# Patient Record
Sex: Male | Born: 1965 | Race: White | Hispanic: No | Marital: Married | Smoking: Never smoker
Health system: Southern US, Community
[De-identification: ages and names within clinical notes are randomized; demographics above are authoritative.]

## PROBLEM LIST (undated history)

## (undated) DIAGNOSIS — R002 Palpitations: Secondary | ICD-10-CM

## (undated) DIAGNOSIS — R51 Headache: Secondary | ICD-10-CM

## (undated) DIAGNOSIS — J45909 Unspecified asthma, uncomplicated: Secondary | ICD-10-CM

## (undated) DIAGNOSIS — R519 Headache, unspecified: Secondary | ICD-10-CM

## (undated) DIAGNOSIS — G4730 Sleep apnea, unspecified: Secondary | ICD-10-CM

## (undated) DIAGNOSIS — I2510 Atherosclerotic heart disease of native coronary artery without angina pectoris: Secondary | ICD-10-CM

## (undated) DIAGNOSIS — I10 Essential (primary) hypertension: Secondary | ICD-10-CM

## (undated) DIAGNOSIS — F410 Panic disorder [episodic paroxysmal anxiety] without agoraphobia: Secondary | ICD-10-CM

## (undated) DIAGNOSIS — K219 Gastro-esophageal reflux disease without esophagitis: Secondary | ICD-10-CM

## (undated) DIAGNOSIS — K589 Irritable bowel syndrome without diarrhea: Secondary | ICD-10-CM

## (undated) DIAGNOSIS — Z818 Family history of other mental and behavioral disorders: Secondary | ICD-10-CM

## (undated) DIAGNOSIS — F329 Major depressive disorder, single episode, unspecified: Secondary | ICD-10-CM

## (undated) DIAGNOSIS — F411 Generalized anxiety disorder: Secondary | ICD-10-CM

## (undated) DIAGNOSIS — I4510 Unspecified right bundle-branch block: Secondary | ICD-10-CM

## (undated) DIAGNOSIS — H539 Unspecified visual disturbance: Secondary | ICD-10-CM

## (undated) DIAGNOSIS — K449 Diaphragmatic hernia without obstruction or gangrene: Secondary | ICD-10-CM

## (undated) DIAGNOSIS — N419 Inflammatory disease of prostate, unspecified: Secondary | ICD-10-CM

## (undated) HISTORY — DX: Generalized anxiety disorder: F41.1

## (undated) HISTORY — DX: Sleep apnea, unspecified: G47.30

## (undated) HISTORY — DX: Essential (primary) hypertension: I10

## (undated) HISTORY — DX: Atherosclerotic heart disease of native coronary artery without angina pectoris: I25.10

## (undated) HISTORY — DX: Gastro-esophageal reflux disease without esophagitis: K21.9

## (undated) HISTORY — DX: Unspecified visual disturbance: H53.9

## (undated) HISTORY — PX: OTHER SURGICAL HISTORY: SHX169

## (undated) HISTORY — DX: Irritable bowel syndrome, unspecified: K58.9

## (undated) HISTORY — PX: COLONOSCOPY: SHX174

## (undated) HISTORY — DX: Inflammatory disease of prostate, unspecified: N41.9

## (undated) HISTORY — DX: Unspecified asthma, uncomplicated: J45.909

## (undated) HISTORY — DX: Diaphragmatic hernia without obstruction or gangrene: K44.9

## (undated) HISTORY — DX: Family history of other mental and behavioral disorders: Z81.8

## (undated) HISTORY — DX: Headache: R51

## (undated) HISTORY — DX: Unspecified right bundle-branch block: I45.10

## (undated) HISTORY — DX: Headache, unspecified: R51.9

## (undated) HISTORY — DX: Major depressive disorder, single episode, unspecified: F32.9

## (undated) HISTORY — DX: Panic disorder (episodic paroxysmal anxiety): F41.0

## (undated) HISTORY — DX: Palpitations: R00.2

---

## 1998-02-28 ENCOUNTER — Emergency Department (HOSPITAL_COMMUNITY): Admission: EM | Admit: 1998-02-28 | Discharge: 1998-02-28 | Payer: Self-pay | Admitting: Emergency Medicine

## 1998-05-07 HISTORY — PX: FRACTURE SURGERY: SHX138

## 1999-02-13 ENCOUNTER — Emergency Department (HOSPITAL_COMMUNITY): Admission: EM | Admit: 1999-02-13 | Discharge: 1999-02-13 | Payer: Self-pay | Admitting: Emergency Medicine

## 1999-07-16 ENCOUNTER — Encounter: Payer: Self-pay | Admitting: Emergency Medicine

## 1999-07-17 ENCOUNTER — Inpatient Hospital Stay (HOSPITAL_COMMUNITY): Admission: EM | Admit: 1999-07-17 | Discharge: 1999-07-17 | Payer: Self-pay | Admitting: Emergency Medicine

## 1999-07-19 ENCOUNTER — Encounter: Admission: RE | Admit: 1999-07-19 | Discharge: 1999-07-19 | Payer: Self-pay | Admitting: *Deleted

## 2000-08-12 ENCOUNTER — Emergency Department (HOSPITAL_COMMUNITY): Admission: EM | Admit: 2000-08-12 | Discharge: 2000-08-12 | Payer: Self-pay | Admitting: Emergency Medicine

## 2000-08-12 ENCOUNTER — Encounter: Payer: Self-pay | Admitting: Emergency Medicine

## 2000-08-28 ENCOUNTER — Ambulatory Visit (HOSPITAL_COMMUNITY): Admission: RE | Admit: 2000-08-28 | Discharge: 2000-08-28 | Payer: Self-pay | Admitting: Neurosurgery

## 2000-08-28 ENCOUNTER — Encounter: Payer: Self-pay | Admitting: Neurosurgery

## 2000-11-27 ENCOUNTER — Emergency Department (HOSPITAL_COMMUNITY): Admission: EM | Admit: 2000-11-27 | Discharge: 2000-11-27 | Payer: Self-pay | Admitting: *Deleted

## 2001-09-20 ENCOUNTER — Encounter: Payer: Self-pay | Admitting: Emergency Medicine

## 2001-09-20 ENCOUNTER — Emergency Department: Admission: EM | Admit: 2001-09-20 | Discharge: 2001-09-20 | Payer: Self-pay | Admitting: Emergency Medicine

## 2001-11-19 ENCOUNTER — Emergency Department (HOSPITAL_COMMUNITY): Admission: EM | Admit: 2001-11-19 | Discharge: 2001-11-19 | Payer: Self-pay | Admitting: Emergency Medicine

## 2001-11-19 ENCOUNTER — Encounter: Payer: Self-pay | Admitting: Emergency Medicine

## 2001-11-20 ENCOUNTER — Encounter: Payer: Self-pay | Admitting: Emergency Medicine

## 2002-03-05 ENCOUNTER — Encounter: Payer: Self-pay | Admitting: Emergency Medicine

## 2002-03-05 ENCOUNTER — Inpatient Hospital Stay (HOSPITAL_COMMUNITY): Admission: EM | Admit: 2002-03-05 | Discharge: 2002-03-06 | Payer: Self-pay | Admitting: Emergency Medicine

## 2002-03-06 ENCOUNTER — Encounter: Payer: Self-pay | Admitting: Cardiology

## 2002-09-18 ENCOUNTER — Emergency Department (HOSPITAL_COMMUNITY): Admission: EM | Admit: 2002-09-18 | Discharge: 2002-09-18 | Payer: Self-pay | Admitting: Emergency Medicine

## 2002-10-16 ENCOUNTER — Emergency Department (HOSPITAL_COMMUNITY): Admission: EM | Admit: 2002-10-16 | Discharge: 2002-10-17 | Payer: Self-pay | Admitting: Emergency Medicine

## 2002-10-17 ENCOUNTER — Encounter: Payer: Self-pay | Admitting: Emergency Medicine

## 2003-02-03 ENCOUNTER — Emergency Department (HOSPITAL_COMMUNITY): Admission: EM | Admit: 2003-02-03 | Discharge: 2003-02-03 | Payer: Self-pay | Admitting: Emergency Medicine

## 2003-02-04 ENCOUNTER — Encounter: Payer: Self-pay | Admitting: *Deleted

## 2003-04-20 ENCOUNTER — Emergency Department (HOSPITAL_COMMUNITY): Admission: EM | Admit: 2003-04-20 | Discharge: 2003-04-20 | Payer: Self-pay | Admitting: Emergency Medicine

## 2003-06-07 ENCOUNTER — Emergency Department (HOSPITAL_COMMUNITY): Admission: EM | Admit: 2003-06-07 | Discharge: 2003-06-08 | Payer: Self-pay | Admitting: Emergency Medicine

## 2003-06-15 ENCOUNTER — Inpatient Hospital Stay (HOSPITAL_BASED_OUTPATIENT_CLINIC_OR_DEPARTMENT_OTHER): Admission: RE | Admit: 2003-06-15 | Discharge: 2003-06-15 | Payer: Self-pay | Admitting: Cardiology

## 2003-07-27 ENCOUNTER — Emergency Department (HOSPITAL_COMMUNITY): Admission: EM | Admit: 2003-07-27 | Discharge: 2003-07-27 | Payer: Self-pay | Admitting: Emergency Medicine

## 2003-08-30 ENCOUNTER — Emergency Department (HOSPITAL_COMMUNITY): Admission: EM | Admit: 2003-08-30 | Discharge: 2003-08-30 | Payer: Self-pay | Admitting: Emergency Medicine

## 2003-10-06 ENCOUNTER — Emergency Department (HOSPITAL_COMMUNITY): Admission: EM | Admit: 2003-10-06 | Discharge: 2003-10-06 | Payer: Self-pay | Admitting: Family Medicine

## 2003-11-11 ENCOUNTER — Emergency Department (HOSPITAL_COMMUNITY): Admission: EM | Admit: 2003-11-11 | Discharge: 2003-11-11 | Payer: Self-pay | Admitting: Emergency Medicine

## 2003-12-12 ENCOUNTER — Emergency Department (HOSPITAL_COMMUNITY): Admission: EM | Admit: 2003-12-12 | Discharge: 2003-12-13 | Payer: Self-pay | Admitting: Emergency Medicine

## 2004-03-24 ENCOUNTER — Emergency Department (HOSPITAL_COMMUNITY): Admission: AD | Admit: 2004-03-24 | Discharge: 2004-03-24 | Payer: Self-pay | Admitting: Family Medicine

## 2004-03-26 ENCOUNTER — Emergency Department (HOSPITAL_COMMUNITY): Admission: EM | Admit: 2004-03-26 | Discharge: 2004-03-27 | Payer: Self-pay | Admitting: Emergency Medicine

## 2004-03-31 ENCOUNTER — Emergency Department (HOSPITAL_COMMUNITY): Admission: EM | Admit: 2004-03-31 | Discharge: 2004-03-31 | Payer: Self-pay | Admitting: Family Medicine

## 2004-05-09 ENCOUNTER — Ambulatory Visit: Payer: Self-pay | Admitting: Family Medicine

## 2004-06-12 ENCOUNTER — Emergency Department (HOSPITAL_COMMUNITY): Admission: EM | Admit: 2004-06-12 | Discharge: 2004-06-12 | Payer: Self-pay | Admitting: Emergency Medicine

## 2004-07-04 ENCOUNTER — Encounter: Payer: Self-pay | Admitting: Family Medicine

## 2004-07-12 ENCOUNTER — Encounter: Payer: Self-pay | Admitting: Family Medicine

## 2004-07-24 ENCOUNTER — Ambulatory Visit: Payer: Self-pay | Admitting: Family Medicine

## 2004-10-05 ENCOUNTER — Ambulatory Visit: Payer: Self-pay | Admitting: Family Medicine

## 2004-10-27 ENCOUNTER — Emergency Department (HOSPITAL_COMMUNITY): Admission: EM | Admit: 2004-10-27 | Discharge: 2004-10-27 | Payer: Self-pay | Admitting: Emergency Medicine

## 2004-11-01 ENCOUNTER — Ambulatory Visit: Payer: Self-pay | Admitting: Family Medicine

## 2004-12-06 ENCOUNTER — Ambulatory Visit: Payer: Self-pay | Admitting: Sports Medicine

## 2004-12-22 ENCOUNTER — Ambulatory Visit: Payer: Self-pay | Admitting: Internal Medicine

## 2005-01-03 ENCOUNTER — Ambulatory Visit: Payer: Self-pay | Admitting: Internal Medicine

## 2005-01-03 HISTORY — PX: ESOPHAGOGASTRODUODENOSCOPY: SHX1529

## 2005-03-29 ENCOUNTER — Emergency Department (HOSPITAL_COMMUNITY): Admission: EM | Admit: 2005-03-29 | Discharge: 2005-03-29 | Payer: Self-pay | Admitting: *Deleted

## 2005-07-05 ENCOUNTER — Ambulatory Visit: Payer: Self-pay | Admitting: Family Medicine

## 2005-07-11 ENCOUNTER — Ambulatory Visit: Payer: Self-pay | Admitting: Internal Medicine

## 2005-07-26 ENCOUNTER — Ambulatory Visit: Payer: Self-pay | Admitting: Internal Medicine

## 2005-07-26 HISTORY — PX: COLONOSCOPY: SHX5424

## 2005-09-18 ENCOUNTER — Emergency Department (HOSPITAL_COMMUNITY): Admission: EM | Admit: 2005-09-18 | Discharge: 2005-09-18 | Payer: Self-pay | Admitting: Family Medicine

## 2005-10-09 ENCOUNTER — Emergency Department (HOSPITAL_COMMUNITY): Admission: EM | Admit: 2005-10-09 | Discharge: 2005-10-09 | Payer: Self-pay | Admitting: Emergency Medicine

## 2006-01-24 ENCOUNTER — Emergency Department (HOSPITAL_COMMUNITY): Admission: EM | Admit: 2006-01-24 | Discharge: 2006-01-24 | Payer: Self-pay | Admitting: Emergency Medicine

## 2006-02-22 ENCOUNTER — Emergency Department (HOSPITAL_COMMUNITY): Admission: EM | Admit: 2006-02-22 | Discharge: 2006-02-22 | Payer: Self-pay | Admitting: Emergency Medicine

## 2006-03-24 ENCOUNTER — Emergency Department (HOSPITAL_COMMUNITY): Admission: EM | Admit: 2006-03-24 | Discharge: 2006-03-25 | Payer: Self-pay | Admitting: Emergency Medicine

## 2006-05-31 ENCOUNTER — Emergency Department (HOSPITAL_COMMUNITY): Admission: EM | Admit: 2006-05-31 | Discharge: 2006-05-31 | Payer: Self-pay | Admitting: Emergency Medicine

## 2006-07-04 DIAGNOSIS — F411 Generalized anxiety disorder: Secondary | ICD-10-CM | POA: Insufficient documentation

## 2006-07-04 DIAGNOSIS — K219 Gastro-esophageal reflux disease without esophagitis: Secondary | ICD-10-CM | POA: Insufficient documentation

## 2006-07-04 DIAGNOSIS — F410 Panic disorder [episodic paroxysmal anxiety] without agoraphobia: Secondary | ICD-10-CM | POA: Insufficient documentation

## 2006-08-13 ENCOUNTER — Telehealth: Payer: Self-pay | Admitting: *Deleted

## 2006-08-14 ENCOUNTER — Encounter: Payer: Self-pay | Admitting: Family Medicine

## 2006-08-14 ENCOUNTER — Telehealth: Payer: Self-pay | Admitting: Family Medicine

## 2006-08-14 ENCOUNTER — Ambulatory Visit: Payer: Self-pay | Admitting: Family Medicine

## 2006-08-14 DIAGNOSIS — N419 Inflammatory disease of prostate, unspecified: Secondary | ICD-10-CM | POA: Insufficient documentation

## 2006-08-14 LAB — CONVERTED CEMR LAB
Nitrite: NEGATIVE
Urobilinogen, UA: 0.2

## 2006-08-19 ENCOUNTER — Telehealth: Payer: Self-pay | Admitting: *Deleted

## 2006-08-19 ENCOUNTER — Emergency Department (HOSPITAL_COMMUNITY): Admission: EM | Admit: 2006-08-19 | Discharge: 2006-08-19 | Payer: Self-pay | Admitting: Family Medicine

## 2006-10-11 ENCOUNTER — Emergency Department (HOSPITAL_COMMUNITY): Admission: EM | Admit: 2006-10-11 | Discharge: 2006-10-11 | Payer: Self-pay | Admitting: Emergency Medicine

## 2006-11-04 ENCOUNTER — Emergency Department (HOSPITAL_COMMUNITY): Admission: EM | Admit: 2006-11-04 | Discharge: 2006-11-04 | Payer: Self-pay | Admitting: Family Medicine

## 2006-12-11 ENCOUNTER — Ambulatory Visit: Payer: Self-pay | Admitting: Family Medicine

## 2006-12-23 ENCOUNTER — Ambulatory Visit (HOSPITAL_COMMUNITY): Admission: RE | Admit: 2006-12-23 | Discharge: 2006-12-23 | Payer: Self-pay | Admitting: Urology

## 2006-12-23 ENCOUNTER — Telehealth: Payer: Self-pay | Admitting: Family Medicine

## 2006-12-24 ENCOUNTER — Telehealth: Payer: Self-pay | Admitting: Family Medicine

## 2007-01-10 ENCOUNTER — Ambulatory Visit (HOSPITAL_BASED_OUTPATIENT_CLINIC_OR_DEPARTMENT_OTHER): Admission: RE | Admit: 2007-01-10 | Discharge: 2007-01-10 | Payer: Self-pay | Admitting: Urology

## 2007-01-22 ENCOUNTER — Telehealth (INDEPENDENT_AMBULATORY_CARE_PROVIDER_SITE_OTHER): Payer: Self-pay | Admitting: *Deleted

## 2007-01-23 ENCOUNTER — Telehealth: Payer: Self-pay | Admitting: *Deleted

## 2007-01-24 ENCOUNTER — Ambulatory Visit: Payer: Self-pay | Admitting: Family Medicine

## 2007-01-24 ENCOUNTER — Encounter (INDEPENDENT_AMBULATORY_CARE_PROVIDER_SITE_OTHER): Payer: Self-pay | Admitting: Family Medicine

## 2007-01-24 LAB — CONVERTED CEMR LAB
CO2: 27 meq/L (ref 19–32)
Cholesterol: 200 mg/dL (ref 0–200)
Eosinophils Relative: 5 % (ref 0–5)
Glucose, Bld: 106 mg/dL — ABNORMAL HIGH (ref 70–99)
HCT: 49.9 % (ref 39.0–52.0)
Hemoglobin: 16.3 g/dL (ref 13.0–17.0)
Lymphocytes Relative: 22 % (ref 12–46)
Lymphs Abs: 1.7 10*3/uL (ref 0.7–3.3)
Monocytes Absolute: 0.5 10*3/uL (ref 0.2–0.7)
Monocytes Relative: 7 % (ref 3–11)
RBC: 5.97 M/uL — ABNORMAL HIGH (ref 4.22–5.81)
Sodium: 141 meq/L (ref 135–145)
Total Bilirubin: 0.6 mg/dL (ref 0.3–1.2)
Total Protein: 7.3 g/dL (ref 6.0–8.3)
Triglycerides: 158 mg/dL — ABNORMAL HIGH (ref <150)
VLDL: 32 mg/dL (ref 0–40)
WBC: 7.7 10*3/uL (ref 4.0–10.5)

## 2007-01-28 ENCOUNTER — Encounter (INDEPENDENT_AMBULATORY_CARE_PROVIDER_SITE_OTHER): Payer: Self-pay | Admitting: Family Medicine

## 2007-02-17 ENCOUNTER — Telehealth (INDEPENDENT_AMBULATORY_CARE_PROVIDER_SITE_OTHER): Payer: Self-pay | Admitting: *Deleted

## 2007-02-17 ENCOUNTER — Ambulatory Visit: Payer: Self-pay | Admitting: Family Medicine

## 2007-02-17 ENCOUNTER — Encounter (INDEPENDENT_AMBULATORY_CARE_PROVIDER_SITE_OTHER): Payer: Self-pay | Admitting: Family Medicine

## 2007-02-17 LAB — CONVERTED CEMR LAB
HCT: 44 % (ref 39.0–52.0)
Hemoglobin: 14.8 g/dL (ref 13.0–17.0)
MCHC: 33.6 g/dL (ref 30.0–36.0)
RDW: 12.5 % (ref 11.5–14.0)

## 2007-03-10 ENCOUNTER — Encounter (INDEPENDENT_AMBULATORY_CARE_PROVIDER_SITE_OTHER): Payer: Self-pay | Admitting: *Deleted

## 2007-04-17 ENCOUNTER — Emergency Department (HOSPITAL_COMMUNITY): Admission: EM | Admit: 2007-04-17 | Discharge: 2007-04-18 | Payer: Self-pay | Admitting: Emergency Medicine

## 2007-04-17 ENCOUNTER — Ambulatory Visit: Payer: Self-pay | Admitting: Family Medicine

## 2007-04-18 ENCOUNTER — Encounter (INDEPENDENT_AMBULATORY_CARE_PROVIDER_SITE_OTHER): Payer: Self-pay | Admitting: *Deleted

## 2007-04-18 DIAGNOSIS — R0789 Other chest pain: Secondary | ICD-10-CM | POA: Insufficient documentation

## 2007-04-18 DIAGNOSIS — R079 Chest pain, unspecified: Secondary | ICD-10-CM

## 2007-04-21 ENCOUNTER — Encounter: Payer: Self-pay | Admitting: *Deleted

## 2007-04-21 ENCOUNTER — Telehealth: Payer: Self-pay | Admitting: *Deleted

## 2007-04-25 ENCOUNTER — Ambulatory Visit: Payer: Self-pay | Admitting: Family Medicine

## 2007-04-25 DIAGNOSIS — R002 Palpitations: Secondary | ICD-10-CM | POA: Insufficient documentation

## 2007-05-02 ENCOUNTER — Ambulatory Visit: Payer: Self-pay | Admitting: Internal Medicine

## 2007-05-06 ENCOUNTER — Telehealth (INDEPENDENT_AMBULATORY_CARE_PROVIDER_SITE_OTHER): Payer: Self-pay | Admitting: *Deleted

## 2007-05-28 ENCOUNTER — Ambulatory Visit: Payer: Self-pay | Admitting: Family Medicine

## 2007-05-28 DIAGNOSIS — F39 Unspecified mood [affective] disorder: Secondary | ICD-10-CM | POA: Insufficient documentation

## 2007-07-03 ENCOUNTER — Ambulatory Visit: Payer: Self-pay | Admitting: Family Medicine

## 2007-07-05 ENCOUNTER — Emergency Department (HOSPITAL_COMMUNITY): Admission: EM | Admit: 2007-07-05 | Discharge: 2007-07-06 | Payer: Self-pay | Admitting: Emergency Medicine

## 2007-07-08 ENCOUNTER — Emergency Department (HOSPITAL_COMMUNITY): Admission: EM | Admit: 2007-07-08 | Discharge: 2007-07-08 | Payer: Self-pay | Admitting: Emergency Medicine

## 2007-07-17 ENCOUNTER — Ambulatory Visit: Payer: Self-pay | Admitting: Family Medicine

## 2007-08-04 ENCOUNTER — Ambulatory Visit: Payer: Self-pay | Admitting: Family Medicine

## 2007-08-04 ENCOUNTER — Encounter: Payer: Self-pay | Admitting: Family Medicine

## 2007-08-04 DIAGNOSIS — M545 Low back pain, unspecified: Secondary | ICD-10-CM | POA: Insufficient documentation

## 2007-08-04 DIAGNOSIS — I10 Essential (primary) hypertension: Secondary | ICD-10-CM | POA: Insufficient documentation

## 2007-08-09 ENCOUNTER — Emergency Department (HOSPITAL_COMMUNITY): Admission: EM | Admit: 2007-08-09 | Discharge: 2007-08-09 | Payer: Self-pay | Admitting: Family Medicine

## 2007-08-23 ENCOUNTER — Observation Stay (HOSPITAL_COMMUNITY): Admission: EM | Admit: 2007-08-23 | Discharge: 2007-08-23 | Payer: Self-pay | Admitting: Emergency Medicine

## 2007-08-23 ENCOUNTER — Encounter: Payer: Self-pay | Admitting: Family Medicine

## 2007-08-23 ENCOUNTER — Ambulatory Visit: Payer: Self-pay | Admitting: Family Medicine

## 2007-09-25 ENCOUNTER — Emergency Department (HOSPITAL_COMMUNITY): Admission: EM | Admit: 2007-09-25 | Discharge: 2007-09-25 | Payer: Self-pay | Admitting: Family Medicine

## 2007-10-09 ENCOUNTER — Emergency Department (HOSPITAL_COMMUNITY): Admission: EM | Admit: 2007-10-09 | Discharge: 2007-10-09 | Payer: Self-pay | Admitting: Emergency Medicine

## 2008-01-01 ENCOUNTER — Ambulatory Visit: Payer: Self-pay | Admitting: Cardiology

## 2008-01-29 ENCOUNTER — Ambulatory Visit: Payer: Self-pay

## 2008-01-29 ENCOUNTER — Ambulatory Visit: Payer: Self-pay | Admitting: Cardiology

## 2008-01-29 ENCOUNTER — Encounter: Payer: Self-pay | Admitting: Cardiology

## 2008-01-29 LAB — CONVERTED CEMR LAB
AST: 18 units/L (ref 0–37)
Alkaline Phosphatase: 71 units/L (ref 39–117)
Bilirubin, Direct: 0.1 mg/dL (ref 0.0–0.3)
Chloride: 102 meq/L (ref 96–112)
Cholesterol: 195 mg/dL (ref 0–200)
GFR calc non Af Amer: 70 mL/min
LDL Cholesterol: 125 mg/dL — ABNORMAL HIGH (ref 0–99)
Potassium: 3.9 meq/L (ref 3.5–5.1)
Sodium: 141 meq/L (ref 135–145)
Total Bilirubin: 0.8 mg/dL (ref 0.3–1.2)
Total CHOL/HDL Ratio: 4.1

## 2008-03-10 ENCOUNTER — Ambulatory Visit: Payer: Self-pay | Admitting: Cardiology

## 2008-03-10 LAB — CONVERTED CEMR LAB
Bilirubin, Direct: 0.1 mg/dL (ref 0.0–0.3)
LDL Cholesterol: 87 mg/dL (ref 0–99)
Total Bilirubin: 1 mg/dL (ref 0.3–1.2)
VLDL: 13 mg/dL (ref 0–40)

## 2008-06-01 ENCOUNTER — Emergency Department (HOSPITAL_COMMUNITY): Admission: EM | Admit: 2008-06-01 | Discharge: 2008-06-02 | Payer: Self-pay | Admitting: Emergency Medicine

## 2008-07-03 ENCOUNTER — Emergency Department (HOSPITAL_COMMUNITY): Admission: EM | Admit: 2008-07-03 | Discharge: 2008-07-03 | Payer: Self-pay | Admitting: Emergency Medicine

## 2008-10-14 ENCOUNTER — Emergency Department (HOSPITAL_COMMUNITY): Admission: EM | Admit: 2008-10-14 | Discharge: 2008-10-14 | Payer: Self-pay | Admitting: Family Medicine

## 2008-10-21 ENCOUNTER — Encounter (INDEPENDENT_AMBULATORY_CARE_PROVIDER_SITE_OTHER): Payer: Self-pay | Admitting: *Deleted

## 2008-12-04 ENCOUNTER — Emergency Department (HOSPITAL_COMMUNITY): Admission: EM | Admit: 2008-12-04 | Discharge: 2008-12-04 | Payer: Self-pay | Admitting: Emergency Medicine

## 2008-12-07 ENCOUNTER — Ambulatory Visit: Payer: Self-pay | Admitting: Family Medicine

## 2008-12-08 ENCOUNTER — Emergency Department (HOSPITAL_COMMUNITY): Admission: EM | Admit: 2008-12-08 | Discharge: 2008-12-09 | Payer: Self-pay | Admitting: Emergency Medicine

## 2008-12-14 ENCOUNTER — Emergency Department (HOSPITAL_COMMUNITY): Admission: EM | Admit: 2008-12-14 | Discharge: 2008-12-14 | Payer: Self-pay | Admitting: Emergency Medicine

## 2008-12-15 ENCOUNTER — Telehealth: Payer: Self-pay | Admitting: *Deleted

## 2008-12-30 ENCOUNTER — Ambulatory Visit: Payer: Self-pay | Admitting: Family Medicine

## 2008-12-30 ENCOUNTER — Encounter: Payer: Self-pay | Admitting: Family Medicine

## 2008-12-30 DIAGNOSIS — Z862 Personal history of diseases of the blood and blood-forming organs and certain disorders involving the immune mechanism: Secondary | ICD-10-CM | POA: Insufficient documentation

## 2008-12-30 DIAGNOSIS — Z8639 Personal history of other endocrine, nutritional and metabolic disease: Secondary | ICD-10-CM | POA: Insufficient documentation

## 2008-12-30 LAB — CONVERTED CEMR LAB
BUN: 14 mg/dL (ref 6–23)
Chloride: 106 meq/L (ref 96–112)
Glucose, Bld: 100 mg/dL — ABNORMAL HIGH (ref 70–99)
Potassium: 4.3 meq/L (ref 3.5–5.3)
Sodium: 141 meq/L (ref 135–145)

## 2009-01-03 ENCOUNTER — Telehealth (INDEPENDENT_AMBULATORY_CARE_PROVIDER_SITE_OTHER): Payer: Self-pay | Admitting: *Deleted

## 2009-01-05 ENCOUNTER — Encounter: Payer: Self-pay | Admitting: Cardiology

## 2009-03-17 ENCOUNTER — Ambulatory Visit: Payer: Self-pay | Admitting: Family Medicine

## 2009-05-09 ENCOUNTER — Emergency Department (HOSPITAL_COMMUNITY): Admission: EM | Admit: 2009-05-09 | Discharge: 2009-05-09 | Payer: Self-pay | Admitting: Family Medicine

## 2009-06-04 ENCOUNTER — Emergency Department (HOSPITAL_COMMUNITY): Admission: EM | Admit: 2009-06-04 | Discharge: 2009-06-04 | Payer: Self-pay | Admitting: Family Medicine

## 2009-06-12 ENCOUNTER — Emergency Department (HOSPITAL_COMMUNITY): Admission: EM | Admit: 2009-06-12 | Discharge: 2009-06-12 | Payer: Self-pay | Admitting: Emergency Medicine

## 2009-06-14 ENCOUNTER — Encounter: Payer: Self-pay | Admitting: Family Medicine

## 2009-06-14 ENCOUNTER — Ambulatory Visit: Payer: Self-pay | Admitting: Family Medicine

## 2009-06-14 LAB — CONVERTED CEMR LAB
LDL Cholesterol: 86 mg/dL (ref 0–99)
Total CHOL/HDL Ratio: 3.5
Triglycerides: 87 mg/dL (ref <150)
VLDL: 17 mg/dL (ref 0–40)

## 2009-06-16 ENCOUNTER — Telehealth: Payer: Self-pay | Admitting: *Deleted

## 2009-06-16 ENCOUNTER — Encounter: Payer: Self-pay | Admitting: Family Medicine

## 2009-08-16 ENCOUNTER — Emergency Department (HOSPITAL_COMMUNITY): Admission: EM | Admit: 2009-08-16 | Discharge: 2009-08-16 | Payer: Self-pay | Admitting: Emergency Medicine

## 2009-08-19 ENCOUNTER — Emergency Department (HOSPITAL_COMMUNITY): Admission: EM | Admit: 2009-08-19 | Discharge: 2009-08-19 | Payer: Self-pay | Admitting: Family Medicine

## 2009-08-25 ENCOUNTER — Ambulatory Visit: Payer: Self-pay | Admitting: Family Medicine

## 2009-08-25 ENCOUNTER — Encounter: Payer: Self-pay | Admitting: *Deleted

## 2009-08-25 DIAGNOSIS — J301 Allergic rhinitis due to pollen: Secondary | ICD-10-CM | POA: Insufficient documentation

## 2009-08-25 DIAGNOSIS — S62339A Displaced fracture of neck of unspecified metacarpal bone, initial encounter for closed fracture: Secondary | ICD-10-CM | POA: Insufficient documentation

## 2009-09-06 ENCOUNTER — Ambulatory Visit: Payer: Self-pay | Admitting: Family Medicine

## 2009-09-06 ENCOUNTER — Encounter: Payer: Self-pay | Admitting: Family Medicine

## 2009-09-07 ENCOUNTER — Encounter: Admission: RE | Admit: 2009-09-07 | Discharge: 2009-11-17 | Payer: Self-pay | Admitting: Family Medicine

## 2009-09-12 ENCOUNTER — Encounter: Payer: Self-pay | Admitting: Family Medicine

## 2009-10-19 ENCOUNTER — Ambulatory Visit: Payer: Self-pay | Admitting: Family Medicine

## 2009-10-19 DIAGNOSIS — G939 Disorder of brain, unspecified: Secondary | ICD-10-CM | POA: Insufficient documentation

## 2009-10-21 ENCOUNTER — Telehealth: Payer: Self-pay | Admitting: Family Medicine

## 2009-10-22 ENCOUNTER — Emergency Department (HOSPITAL_COMMUNITY): Admission: EM | Admit: 2009-10-22 | Discharge: 2009-10-22 | Payer: Self-pay | Admitting: Emergency Medicine

## 2009-11-02 ENCOUNTER — Emergency Department (HOSPITAL_COMMUNITY): Admission: EM | Admit: 2009-11-02 | Discharge: 2009-11-02 | Payer: Self-pay | Admitting: Family Medicine

## 2009-12-01 ENCOUNTER — Ambulatory Visit: Payer: Self-pay | Admitting: Cardiology

## 2009-12-12 ENCOUNTER — Ambulatory Visit: Payer: Self-pay | Admitting: Family Medicine

## 2009-12-12 DIAGNOSIS — H539 Unspecified visual disturbance: Secondary | ICD-10-CM | POA: Insufficient documentation

## 2010-03-14 ENCOUNTER — Emergency Department (HOSPITAL_COMMUNITY): Admission: EM | Admit: 2010-03-14 | Discharge: 2010-03-14 | Payer: Self-pay | Admitting: Emergency Medicine

## 2010-03-15 ENCOUNTER — Encounter: Payer: Self-pay | Admitting: *Deleted

## 2010-05-09 ENCOUNTER — Ambulatory Visit: Admission: RE | Admit: 2010-05-09 | Discharge: 2010-05-09 | Payer: Self-pay | Source: Home / Self Care

## 2010-05-09 ENCOUNTER — Encounter: Payer: Self-pay | Admitting: Family Medicine

## 2010-05-09 LAB — CONVERTED CEMR LAB
Chloride: 107 meq/L (ref 96–112)
Creatinine, Ser: 1.18 mg/dL (ref 0.40–1.50)
Potassium: 4.9 meq/L (ref 3.5–5.3)

## 2010-05-24 ENCOUNTER — Ambulatory Visit: Admission: RE | Admit: 2010-05-24 | Discharge: 2010-05-24 | Payer: Self-pay | Source: Home / Self Care

## 2010-06-07 ENCOUNTER — Emergency Department (HOSPITAL_COMMUNITY)
Admission: EM | Admit: 2010-06-07 | Discharge: 2010-06-08 | Discharge status: Against Medical Advice | Payer: Self-pay | Attending: Emergency Medicine | Admitting: Emergency Medicine

## 2010-06-07 DIAGNOSIS — Z0389 Encounter for observation for other suspected diseases and conditions ruled out: Secondary | ICD-10-CM | POA: Insufficient documentation

## 2010-06-08 ENCOUNTER — Other Ambulatory Visit (HOSPITAL_COMMUNITY): Payer: Self-pay | Admitting: Emergency Medicine

## 2010-06-08 ENCOUNTER — Ambulatory Visit (HOSPITAL_COMMUNITY)
Admission: RE | Admit: 2010-06-08 | Discharge: 2010-06-08 | Disposition: A | Discharge status: Home / Self Care | Payer: PRIVATE HEALTH INSURANCE | Source: Ambulatory Visit | Attending: Emergency Medicine | Admitting: Emergency Medicine

## 2010-06-08 ENCOUNTER — Inpatient Hospital Stay (HOSPITAL_COMMUNITY)
Admission: RE | Admit: 2010-06-08 | Discharge: 2010-06-08 | Disposition: A | Discharge status: Home / Self Care | Payer: Self-pay | Source: Ambulatory Visit

## 2010-06-08 DIAGNOSIS — M545 Low back pain, unspecified: Secondary | ICD-10-CM | POA: Insufficient documentation

## 2010-06-08 DIAGNOSIS — M5137 Other intervertebral disc degeneration, lumbosacral region: Secondary | ICD-10-CM | POA: Insufficient documentation

## 2010-06-08 DIAGNOSIS — M51379 Other intervertebral disc degeneration, lumbosacral region without mention of lumbar back pain or lower extremity pain: Secondary | ICD-10-CM | POA: Insufficient documentation

## 2010-06-08 NOTE — Assessment & Plan Note (Signed)
Summary: per check out/sf  Medications Added METOPROLOL SUCCINATE 50 MG XR24H-TAB (METOPROLOL SUCCINATE) 1/2 tab once daily ALPRAZOLAM 0.5 MG TABS (ALPRAZOLAM) as needed        Visit Type:  1 yr f/u Primary Provider:  Eustaquio Boyden  MD  CC:  chest pain states he has a hiatal hernia and thinks this is the cause of his cp....  History of Present Illness: Mr Kissoon returns today for history of palpitations and noncardiac chest pain.  He's doing better with less palpitations and metoprolol. He uses p.r.n. alprazolam for panic attacks. He is followed by family practice.  He says the chest pain but it clearly sounds noncardiac.  Current Medications (verified): 1)  Aspirin 81 Mg Tbec (Aspirin) .... Take One Daily 2)  Zyrtec Allergy 10 Mg Tabs (Cetirizine Hcl) .... Take One Nightly For Allergies 3)  Flonase 50 Mcg/act Susp (Fluticasone Propionate) .... 2 Puffs Into Each Nostril Daily 4)  Metoprolol Succinate 50 Mg Xr24h-Tab (Metoprolol Succinate) .... 1/2 Tab Once Daily 5)  Alprazolam 0.5 Mg Tabs (Alprazolam) .... As Needed  Allergies: 1)  ! * Hctz 2)  Penicillin  Past History:  Past Medical History: Last updated: 10/19/2009 ESSENTIAL HYPERTENSION, BENIGN (ICD-401.1) CHEST PAIN (ICD-786.50) PALPITATIONS (ICD-785.1) DIZZINESS (ICD-780.4) GASTROESOPHAGEAL REFLUX, NO ESOPHAGITIS (ICD-530.81) BACK PAIN (ICD-724.5) PAIN IN JOINT PELVIC REGION AND THIGH (ICD-719.45) PAIN IN JOINT, HAND (ICD-719.44) FAMILY STRESS (ICD-V61.9) DEPRESSION, MAJOR (ICD-296.20) Family Hx of DISEASE, CARDIOVASCULAR NOS (ICD-429.2) ABDOMINAL PAIN, SUPRAPUBIC (ICD-789.09) PROSTATITIS NOS (ICD-601.9) PANIC ATTACKS (ICD-300.01) ANXIETY (ICD-300.00) ABDOMINAL PAIN, SUPRAPUBIC (ICD-789.09) PROSTATITIS NOS (ICD-601.9) PANIC ATTACKS (ICD-300.01) GASTROESOPHAGEAL REFLUX, NO ESOPHAGITIS (ICD-530.81) ANXIETY (ICD-300.00) Family Hx of DISEASE, CARDIOVASCULAR NOS (ICD-429.2) Told he has a RBBB Hiatal  hernia Irritable bowel syndrome per Dr. 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 Cardiolite- no reversible ischemia, but + EKG changes - 02/04/2002 Cath- EF55-60% small patent vessels - 06/08/2003 Colonoscopy- IBS otherwise wnl - 07/05/2005 EGD -nl - 12/05/2004 2D Echo - normal - 2009. 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  Family History: Last updated: 05/28/2007 2 Brothers and 2 Sisters in good health, MGM- died at 54 of hodgkin's, MGF: died in 35s, Mother- alive, MI in her 50`s and PTCA and 3 stents, CHF  Pyloric stenosis runs in family, heart disease, hodgkin's disease, anxiety  ?dx of bipolar in daughter??  Social History: Last updated: 10/19/2009 Aram Candela x 2yrs.  He has nine children, 2 from first marriage ages 35 and 72, and 64 from 49rd, age range 46mo to 46 yo.  Currently on 3rd marriage for last 4 years.  Seven children live with him which cause him significant stress. Denies smoking, EtOH, recreational drugs, + remote h/o MJ 15 yrs ago.  Risk Factors: Smoking Status: never (10/19/2009) Passive Smoke Exposure: yes (10/19/2009)  Review of Systems       negative other than history of present illness  Vital Signs:  Patient profile:   45 year old male Height:      65.75 inches Weight:      149 pounds BMI:     24.32 Pulse rate:   64 / minute Pulse rhythm:   regular BP sitting:  104 / 70  (left arm) Cuff size:   large  Vitals Entered By: Danielle Rankin, CMA (December 01, 2009 1:46 PM)  Physical Exam  General:  unkept.   Head:  normocephalic and atraumatic Eyes:  PERRLA/EOM  intact; conjunctiva and lids normal. Neck:  Neck supple, no JVD. No masses, thyromegaly or abnormal cervical nodes. Chest Fuller Makin:  no deformities or breast masses noted Lungs:  Clear bilaterally to auscultation and percussion. Heart:  Non-displaced PMI, chest non-tender; regular rate and rhythm, S1, S2 without murmurs, rubs or gallops. Carotid upstroke normal, no bruit. Normal abdominal aortic size, no bruits. Femorals normal pulses, no bruits. Pedals normal pulses. No edema, no varicosities. Msk:  Back normal, normal gait. Muscle strength and tone normal. Pulses:  pulses normal in all 4 extremities Extremities:  No clubbing or cyanosis. Neurologic:  Alert and oriented x 3. Skin:  Intact without lesions or rashes. Psych:  Normal affect.   EKG  Procedure date:  12/01/2009  Findings:      normal sinus rhythm, normal EKG  Impression & Recommendations:  Problem # 1:  ESSENTIAL HYPERTENSION, BENIGN (ICD-401.1)  The following medications were removed from the medication list:    Hydrochlorothiazide 12.5 Mg Caps (Hydrochlorothiazide) .[name]... Take one daily for blood pressure His updated medication list for this problem includes:    Aspirin 81 Mg Tbec (Aspirin) .[name]... Take one daily    Metoprolol Succinate 50 Mg Xr24h-tab (Metoprolol succinate) .[name]... 1/2 tab once daily  Problem # 2:  CHEST PAIN (ICD-786.50)  His updated medication list for this problem includes:    Aspirin 81 Mg Tbec (Aspirin) .[name]... Take one daily    Metoprolol Succinate 50 Mg Xr24h-tab (Metoprolol succinate) .[name]... 1/2 tab once daily  Orders: EKG w/ Interpretation (93000)  Problem # 3:  PALPITATIONS (ICD-785.1)  His updated medication list for this problem includes:    Aspirin 81 Mg Tbec (Aspirin) .[name]... Take one daily    Metoprolol Succinate 50 Mg Xr24h-tab (Metoprolol succinate) .[name]... 1/2 tab once daily  Problem # 4:  Family Hx of DISEASE, CARDIOVASCULAR NOS (ICD-429.2) Assessment: Unchanged  Problem # 5:   PALPITATIONS (ICD-785.1) Assessment: Improved  His updated medication list for this problem includes:    Aspirin 81 Mg Tbec (Aspirin) .[name]... Take one daily    Metoprolol Succinate 50 Mg Xr24h-tab (Metoprolol succinate) .[name]... 1/2 tab once daily  His updated medication list for this problem includes:    Aspirin 81 Mg Tbec (Aspirin) .[name]... Take one daily    Metoprolol Succinate 50 Mg Xr24h-tab (Metoprolol succinate) .[name]... 1/2 tab once daily  Problem # 6:  CHEST PAIN (ICD-786.50) This is noncardiac. Reassurance given. Return p.r.n. His updated medication list for this problem includes:    Aspirin 81 Mg Tbec (Aspirin) .[name]... Take one daily    Metoprolol Succinate 50 Mg Xr24h-tab (Metoprolol succinate) .[name]... 1/2 tab once daily  Orders: EKG w/ Interpretation (93000)  Patient Instructions: 1)  Your physician recommends that you schedule a follow-up appointment in: AS NEEDED 2)  Your physician recommends that you continue on your current medications as directed. Please refer to the Current Medication list given to you today.

## 2010-06-08 NOTE — Assessment & Plan Note (Signed)
Summary: New MD visit: Mood and Vision    Vital Signs:  Patient profile:   45 year old male Height:      65.75 inches Weight:      150 pounds BMI:     24.48 Temp:     98.9 degrees F oral Pulse rate:   70 / minute BP sitting:   137 / 76  (left arm) Cuff size:   regular  Vitals Entered By: Tessie Fass CMA (December 12, 2009 1:45 PM) CC: F/U, Depression   Primary Care Provider:  Eustaquio Boyden  MD  CC:  F/U and Depression.  History of Present Illness: 1 YOM w/ PMHx/o multiple medical problems including anxiety and depression here for new MD visit.  Mood: Pt reports mood being down but stable. No HI/SI. Pt feels intermittently stressed out as pt is out of work and worried about providing for kids as new school year approaches.Pt's wife also unemployed.  Desiring refill on xanax. Has not been on long term SSRI in past, has never had formal psychotherapy.   Vision: Pt reports intermittent worsening in vision in setting of hx/o decreased vision. Intermittently worsened w/ anxiety. No heminopsia, or visual field defects per pt. Was previously referred to opthomalogy in the past per pt, but was unable to followup.   Depression History:      Positive alarm features for depression include fatigue (loss of energy) and impaired concentration (indecisiveness).  However, he denies significant weight loss, significant weight gain, hypersomnia, and feelings of worthlessness (guilt).  The patient denies symptoms of a manic disorder including persistently & abnormally elevated mood, abnormally & persistently irritable mood, less need for sleep, talkative or feels need to keep talking, distractibility, flight of ideas, increase in goal-directed activity, psychomotor agitation, inflated self-esteem or grandiosity, excessive buying sprees, excessive sexual indiscretions, and excessive foolish business investments.         Allergies: 1)  ! * Hctz 2)  Penicillin  Physical Exam  General:  alert and  healthy-appearing.   Head:  normocephalic and atraumatic.   Eyes:  pupils equal, pupils round, and pupils reactive to light.   Nose:  no external deformity.   Mouth:  good dentition.   Neck:  supple and full ROM.   Lungs:  CTAB, no wheezes, rales, rhoncii Heart:  RRR, no rubs, gallops, murmurs Abdomen:  S/NT/ND+bowel sounds Extremities:  2+ peripheral pulses, no edema Neurologic:  grossly intact Psych:  good eye contact and not anxious appearing.     Impression & Recommendations:  Problem # 1:  ANXIETY (ICD-300.00) Symptoms reatively unchanged from previoous visit per pt. Plan to restart pt on Zoloft as pt reports intermittent compliance w/ medication in the past  w/ recommendation for [facility] followup w/ Dr. Pascal Lux. Will hold on xanax refill as this was a concern  in the past. No Hi/SI. Psych red flags reviewed w/ pt.  His updated medication list for this problem includes:    Alprazolam 0.5 Mg Tabs (Alprazolam) .[name]... As needed    Zoloft 50 Mg Tabs (Sertraline hcl) .[name]... Take 1 tablet daily for 14 days, then 2 tablets daily thereafter.  Orders: [facility]- Est Level  3 (11914)  Problem # 2:  UNSPECIFIED VISUAL DISTURBANCE (ICD-368.9) No profound visual disturbance or significant findings on funduscopic exam. Will refer to optholmalogy. Stressed w/ pt need for compliant followup.  Orders: Ophthalmology Referral (Ophthalmology)  Complete Medication List: 1)  Aspirin 81 Mg Tbec (Aspirin) .... Take one daily 2)  Zyrtec Allergy  10 Mg Tabs (Cetirizine hcl) .... Take one nightly for allergies 3)  Flonase 50 Mcg/act Susp (Fluticasone propionate) .... 2 puffs into each nostril daily 4)  Metoprolol Succinate 50 Mg Xr24h-tab (Metoprolol succinate) .... 1/2 tab once daily 5)  Alprazolam 0.5 Mg Tabs (Alprazolam) .... As needed 6)  Zoloft 50 Mg Tabs (Sertraline hcl) .... Take 1 tablet daily for 14 days, then 2 tablets daily thereafter.  Patient Instructions: 1)  It was good to see you today 2)  I will  refer you to the eye doctor for your vision. Make sure to follow up. 3)  I will also refer you to our mood disorder clinic w/ Dr. Pascal Lux to help with your mood.  4)  I will also send some medications for you to pick up from the pharmacy  5)  If you have any further questions, please call the [facility] at your earliest convenience 6)  Otherwise followup in 2-4 weeks.  7)  Thank you and  God Bless, 8)  Doree Albee MD Prescriptions: ZOLOFT 50 MG TABS (SERTRALINE HCL) take 1 tablet daily for 14 days, then 2 tablets daily thereafter.  #42 x 0   Entered and Authorized by:   Doree Albee MD   Signed by:   Doree Albee MD on 12/20/2009   Method used:   [facility]ron[facility] to        [facility]  [address] [phone][facility] (retail)       [address]       [place], [place]  [address]       Ph: [phone]       Fax: [phone]   RxID:   [id number]

## 2010-06-08 NOTE — Assessment & Plan Note (Signed)
Summary: referral & allergies,df   Vital Signs:  Patient profile:   45 year old male Height:      65.75 inches Weight:      145 pounds Temp:     98.1 degrees F oral Pulse rate:   68 / minute BP sitting:   135 / 94  (right arm) Cuff size:   regular  Vitals Entered By: [place] Morelle, SMA CC: Pt would like allergy meds and a referral for a hand doctor. Pain Assessment Patient in pain? yes     Location: Right hand  Intensity: 8   Primary Care Provider:  Eustaquio Boyden  MD  CC:  Pt would like allergy meds and a referral for a hand doctor.[name]  History of Present Illness: CC: issues  1. allergies - itchy eyes, itchy throat (PND).  No rhinorrhea, congestion.  + family with allergies, asthma, eczema.  Has tried claritin in past, didn't really help.  Using visine for eyes.  2. broke hand - punched dresser and broke hand, 1 1/2 wks ago.  Went to [facility].  Not taking anything for pain.  8/10 pain.  Given brace to wear, only using at night.  Has not been in to see anyone since.  Seen at [facility], found to have boxer fracture of 5th metacarpal neck, xrays reviewed.  Habits & Providers  Alcohol-Tobacco-Diet     Tobacco Status: never  Current Medications (verified): 1)  Metoprolol Succinate 50 Mg Xr24h-Tab (Metoprolol Succinate) .[name].. 1 Tab Once Daily 2)  Aspirin 81 Mg Tbec (Aspirin) .... Take One Daily 3)  Zyrtec Allergy 10 Mg Tabs (Cetirizine Hcl) .... Take One Nightly For Allergies 4)  Flonase 50 Mcg/act Susp (Fluticasone Propionate) .... 2 Puffs Into Each Nostril Daily  Allergies: 1)  Penicillin PMH-FH-SH reviewed for relevance  Physical Exam  General:  alert, well-developed, and well-nourished.   Eyes:  No corneal or conjunctival inflammation noted. EOMI. Perrla.  Nose:  External nasal examination shows no deformity or inflammation.  Mouth:  Oral mucosa and oropharynx without lesions or exudates.  slight cobblestoning Neck:  No deformities, masses, or tenderness noted. Msk:  R hand -  swelling dorsal hand along neck of 5th MC.  unable to fully make fist.  slight malrotation of MC compared to left.  L hand - WNL Pulses:  2+ radial pulses Extremities:  No edema   Impression & Recommendations:  Problem # 1:  ALLERGIC RHINITIS, SEASONAL (ICD-477.0) has tried claritin without improvement.  trial of zyrtec and flonase, RTC 2 wks for f/u.  Orders: [facility]- Est Level  3 (47829)  Problem # 2:  CLOSED FRACTURE OF NECK OF METACARPAL BONE (ICD-815.04) hand surgery referral.  naprosyn for 2 wks.  precepted with Dr. Donalee Citrin.  Advised to wear ulnar gutter splint every day. Orders: Orthopedic Referral (Ortho) [facility]- Est Level  3 (56213)  Complete Medication List: 1)  Metoprolol Succinate 50 Mg Xr24h-tab (Metoprolol succinate) .[name].. 1 tab once daily 2)  Aspirin 81 Mg Tbec (Aspirin) .... Take one daily 3)  Zyrtec Allergy 10 Mg Tabs (Cetirizine hcl) .... Take one nightly for allergies 4)  Flonase 50 Mcg/act Susp (Fluticasone propionate) .... 2 puffs into each nostril daily  Patient Instructions: 1)  Return to see me in 2 wks. 2)  Use ulnar gutter splint!  Use gauze to help comfort.  Naprosyn anti inflammatory for next 2 weeks as needed.  Ice hand for 15-20 min at a time, 1-2 times a day.  Orthopedic surgeon. 3)  Zyrtec at  night for allergies and flonase 2 squirts into each nostril daily.  use daily for 2 wks, return to see me. Prescriptions: FLONASE 50 MCG/ACT SUSP (FLUTICASONE PROPIONATE) 2 puffs into each nostril daily  #1 x 3   Entered and Authorized by:   Eustaquio Boyden  MD   Signed by:   Eustaquio Boyden  MD on 08/25/2009   Method used:   Faxed to ...       [facility] Department (retail)       [address] [place], [place]  [address]       Ph: [phone]       Fax: [phone]   RxID:   [id number] ZYRTEC ALLERGY 10 MG TABS (CETIRIZINE HCL) take one nightly for allergies  #30 x 3   Entered and Authorized by:   Eustaquio Boyden  MD   Signed by:    Eustaquio Boyden  MD on 08/25/2009   Method used:   Faxed to ...       [facility] Department (retail)       [address], [place]  [address]       Ph: [phone]       Fax: [phone]   RxID:   [id number] METOPROLOL SUCCINATE 50 MG XR24H-TAB (METOPROLOL SUCCINATE) 1 tab once daily  #90 x 3   Entered and Authorized by:   Eustaquio Boyden  MD   Signed by:   Eustaquio Boyden  MD on 08/25/2009   Method used:   Faxed to ...       [facility] Department (retail)       [address] [place], [place]  [address]       Ph: [phone]       Fax: [phone]   RxID:   [id number] Aleda Grana 50 MCG/ACT SUSP (FLUTICASONE PROPIONATE) 2 puffs into each nostril daily  #1 x 3   Entered and Authorized by:   Eustaquio Boyden  MD   Signed by:   Eustaquio Boyden  MD on 08/25/2009   Method used:   [facility]  [address] [phone][facility] (retail)       [address]       [place], [place]  [address]       Ph: [phone]       Fax: [phone]   RxID:   [id number] ZYRTEC ALLERGY 10 MG TABS (CETIRIZINE HCL) take one nightly for allergies  #30 x 3   Entered and Authorized by:   Eustaquio Boyden  MD   Signed by:   Eustaquio Boyden  MD on 08/25/2009   Method used:   [facility]        [facility]  [address] [phone][facility] (retail)       [address]       [place], [place]  [address]       Ph: [phone]       Fax: [phone]   RxID:   [id number]

## 2010-06-08 NOTE — Miscellaneous (Signed)
   Clinical Lists Changes called Dr Debby Bud office to schedule ov for hand fx. was told pt would have to see whoever is on call. Dr Debby Bud office will call back this afternoon to schedule the appt.Molly Maduro [facility] CMA  August 25, 2009 12:20 PM.   Appt scheduled at [facility] ortho at 2pm this afternoon. pt notified.Tessie Fass CMA  August 25, 2009 1:58 PM   pt called and said [facility] does not accept orange card through [facility]. called Jaynee Eagles and she said no orthopedic surgeon accepts that insurance. notified pt of this and that he would have to pay for this out of pocket. says he will work somehting out.Tessie Fass CMA  August 25, 2009 1:59 PM  if he is unable to go to orthopedist, I would like him to f/u with [facility]. Eustaquio Boyden  MD  August 28, 2009 5:23 PM   called pt and told him since he was not able to see an orthopedist to follow up with [facility], pt agreed.Tessie Fass CMA  August 30, 2009 10:41 AM

## 2010-06-08 NOTE — Miscellaneous (Signed)
Summary: OT Initial Summary/[facility]  OT Initial Summary/[facility]   Imported By: Lanelle Bal 09/15/2009 10:27:40  _____________________________________________________________________  External Attachment:    Type:   Image     Comment:   External Document

## 2010-06-08 NOTE — Letter (Signed)
Summary: Results Follow-up Letter  [facility]  [address]   [place], [place] [address]   Phone: [phone]  Fax: [phone]    06/16/2009  [address] [place], [place]  [address]  Dear Mr. [facility],   The following are the results of your recent test(s):  Test     Result      _________________________________________________________ Cholesterol LDL(Bad cholesterol):    86      Your goal is less than: 130  HDL (Good cholesterol):   42     Your goal is more than: 40 _________________________________________________________ Other Tests:  Your cholesterol levels are doing great.  Regardless, keep an eye on what you eat to ensure optimal health. _________________________________________________________   Sincerely,  Eustaquio Boyden  MD [facility]

## 2010-06-08 NOTE — Assessment & Plan Note (Signed)
Summary: F/U 2-4 WKS PER MD/RH   Vital Signs:  Patient profile:   45 year old male Height:      65 inches Weight:      154.6 pounds BMI:     25.82 Temp:     98.3 degrees F oral Pulse rate:   76 / minute BP sitting:   165 / 82  Vitals Entered By: Golden Circle RN (May 24, 2010 8:38 AM) CC: Hypertension Management Pain Assessment Patient in pain? yes     Location: "joints" Intensity: 3  Does patient need assistance? Functional Status Self care   Primary Care Provider:  Doree Albee MD  CC:  Hypertension Management.  History of Present Illness: Mood: Curertnly stable per pt. No HI/SI. Feels that going to gym has helped mood tremendously. Feels that mood is not "ideal",  but is better. Still feels that psychiatry would not be a good option.  Episodes of palpitaitons and CP have improved w/ exercise.   Hypertension History:      He complains of headache and chest pain, but denies dyspnea with exertion, orthopnea, PND, peripheral edema, and visual symptoms.  Further comments include: Pt with longstanding hx/o CP. has been seen by Cardiology and they feel the CP is non-cardiac/related to anxiety. Also feel that metoprolol may be making him feel to short of breath. No orthopnea, LE swelling, DOE. Is currently exercising .        Positive major cardiovascular risk factors include male age 30 years old or older and hypertension.  Negative major cardiovascular risk factors include non-tobacco-user status.     Current Medications (verified): 1)  Aspirin 81 Mg Tbec (Aspirin) .... Take One Daily 2)  Zyrtec Allergy 10 Mg Tabs (Cetirizine Hcl) .... Take One Nightly For Allergies 3)  Flonase 50 Mcg/act Susp (Fluticasone Propionate) .... 2 Puffs Into Each Nostril Daily 4)  Alprazolam 0.5 Mg Tabs (Alprazolam) .... As Needed 5)  Triamterene-Hctz 37.5-25 Mg Tabs (Triamterene-Hctz) .... 1/2 Tablet Daily 6)  Wellbutrin 100 Mg Tabs (Bupropion Hcl) .[name].. 1 Tablet Twice Daily 7)  Propranolol  Hcl 40 Mg Tabs (Propranolol Hcl) .[name].. 1 Tab By Mouth Bid  Allergies: 1)  ! * Hctz 2)  Penicillin  Physical Exam  General:  alert and well-developed.   Head:  normocephalic and atraumatic.   Mouth:  good dentition.   Neck:  supple and full ROM.   Lungs:  CTAB, no wheezes, rales, rhoncii Heart:  RRR, no rubs, gallops, murmurs Abdomen:  S/NT/+ bowel sounds  Extremities:  2+ peripheral pulses, no edema   Impression & Recommendations:  Problem # 1:  ESSENTIAL HYPERTENSION, BENIGN (ICD-401.1)  Elevated today. Will transition pt from metoprolol to propranolol out of concern for possible medication induced bradycardia. Propranolol should also help with anxiety and recurrent HAs. Pt instructed to increase lisinopril-HCTZ to 1 tablet daily. Will recheck BP in 2 weeks. Encouraged checking BPs at home. Pt agreeable to plan  The following medications were removed from the medication list:    Metoprolol Succinate 50 Mg Xr24h-tab (Metoprolol succinate) .[name]... 1/2 tab once daily His updated medication list for this problem includes:    Triamterene-hctz 37.5-25 Mg Tabs (Triamterene-hctz) .[name]... 1/2 tablet daily    Propranolol Hcl 40 Mg Tabs (Propranolol hcl) .[name]... 1 tab by mouth bid  Orders: [facility]- Est  Level 4 (66440)  Problem # 2:  ANXIETY (ICD-300.00)  Overall presentation consistent with GAD. Will start on trial of Wellbutrin in setting of active smoking. Will reassess mood in  2 weeks. Encouraged compliance with medication given hx/o poor folllowup and noncompliance in the past. Pt agreeable in the past.  His updated medication list for this problem includes:    Alprazolam 0.5 Mg Tabs (Alprazolam) .[name]... As needed    Wellbutrin 100 Mg Tabs (Bupropion hcl) .[name]... 1 tablet twice daily  Orders: [facility]- Est  Level 4 ([address])  Complete Medication List: 1)  Aspirin 81 Mg Tbec (Aspirin) .... Take one daily 2)  Zyrtec Allergy 10 Mg Tabs (Cetirizine hcl) .... Take one nightly for allergies 3)  Flonase  50 Mcg/act Susp (Fluticasone propionate) .... 2 puffs into each nostril daily 4)  Alprazolam 0.5 Mg Tabs (Alprazolam) .... As needed 5)  Triamterene-hctz 37.5-25 Mg Tabs (Triamterene-hctz) .... 1/2 tablet daily 6)  Wellbutrin 100 Mg Tabs (Bupropion hcl) .[name].. 1 tablet twice daily 7)  Propranolol Hcl 40 Mg Tabs (Propranolol hcl) .[name].. 1 tab by mouth bid  Hypertension Assessment/Plan:      The patient's hypertensive risk group is category B: At least one risk factor (excluding diabetes) with no target organ damage.  His calculated 10 year risk of coronary heart disease is 7 %.  Today's blood pressure is 165/82.  His blood pressure goal is < 140/90.  Patient Instructions: 1)  It was good to see you today 2)  Increase your triamterene-HCTZ to 1 tablet daily  3)  I will also prescribe you a medication for your mood  4)  STOP the metoprolol  5)  START the propranolol as prescribed  6)  If have any suicidal thoughts, give [place] a call or go to the ED 7)  Otherwise call with any questions 8)  Come back to see me in 2 weeks.  9)  Happy New Year and God Juda, 10)  Doree Albee MD  Prescriptions: PROPRANOLOL HCL 40 MG TABS (PROPRANOLOL HCL) 1 tab by mouth bid  #60 x 1   Entered and Authorized by:   Doree Albee MD   Signed by:   Doree Albee MD on 05/24/2010   Method used:   Electronically to        [facility] [phone]* (retail)       [address]       [place], [place]  [address]       Ph: [phone]       Fax: [phone]   RxID:   [id number] WELLBUTRIN 100 MG TABS (BUPROPION HCL) 1 tablet twice daily  #30 x 1   Entered and Authorized by:   Doree Albee MD   Signed by:   Doree Albee MD on 05/24/2010   Method used:   Electronically to        [facility]  [address] [phone]* (retail)       [address]       [place], [place]  [address]       Ph: [phone]       Fax: [phone]   RxID:   [id number]    Orders Added: 1)  [facility]- Est  Level 4  [[address]]

## 2010-06-08 NOTE — Assessment & Plan Note (Signed)
Summary: f/u and PATIENT SUMMARY   Vital Signs:  Patient profile:   45 year old male Height:      65.75 inches Weight:      146 pounds BMI:     23.83 Temp:     97.8 degrees F oral Pulse rate:   58 / minute BP sitting:   127 / 88  (right arm) Cuff size:   regular  Vitals Entered By: Tessie Fass CMA (October 19, 2009 8:40 AM) CC: F/U Is Patient Diabetic? No Pain Assessment Patient in pain? yes     Location: back Intensity: 8   Primary Care Provider:  Eustaquio Boyden  MD  CC:  F/U.  History of Present Illness: CC: f/u fx, HTN  1. R boxer fracture - punched dresser and broke hand.  DOI 08/15/2009.  [facility] to [facility]. Had been noncompliant with splinting. Saw Dr. Dallas Schimke.  sent to OT for custom hand splint 09/07/2009.  Never returned for f/u.  only used custom splint x 1 day.  Feels hand healing fine.  Films: Evidence for a minimally displaced apex dorsal angulation 5th MT fracture. Xray date 08/16/2009  2. Blood pressure - main concern today is elevated readings.  Brings log of last few days with about 10+ readings per day.  running 119 - 157 systolic.  Feels running 5 points higher than before.  On metoprolol 25mg  sometimes once daily sometimes two times a day.  Feels HA getting worse.  No chest pain or tightness or SOB other than associated with panic attacks.  voiding fine.    3. anxiety/panic attacks - feels anxiety worsening.  requests xanax (which he is taking at home but I have not prescribed).  previously tried on SSRIs but never gave fair course of a few weeks.  Lives with wife Rillar Knott and 7 children which cause significant stress.  Habits & Providers  Alcohol-Tobacco-Diet     Tobacco Status: never     Tobacco Counseling: not indicated; no tobacco use     Passive Smoke Exposure: yes  Current Medications (verified): 1)  Aspirin 81 Mg Tbec (Aspirin) .... Take One Daily 2)  Zyrtec Allergy 10 Mg Tabs (Cetirizine Hcl) .... Take One Nightly For Allergies 3)  Flonase 50  Mcg/act Susp (Fluticasone Propionate) .... 2 Puffs Into Each Nostril Daily 4)  Hydrochlorothiazide 12.5 Mg Caps (Hydrochlorothiazide) .... Take One Daily For Blood Pressure 5)  Zoloft 50 Mg Tabs (Sertraline Hcl) .... Take One Daily  Allergies (verified): 1)  Penicillin  Past History:  Past medical, surgical, family and social histories (including risk factors) reviewed for relevance to current acute and chronic problems.  Past Medical History: ESSENTIAL HYPERTENSION, BENIGN (ICD-401.1) CHEST PAIN (ICD-786.50) PALPITATIONS (ICD-785.1) DIZZINESS (ICD-780.4) GASTROESOPHAGEAL REFLUX, NO ESOPHAGITIS (ICD-530.81) BACK PAIN (ICD-724.5) PAIN IN JOINT PELVIC REGION AND THIGH (ICD-719.45) PAIN IN JOINT, HAND (ICD-719.44) FAMILY STRESS (ICD-V61.9) DEPRESSION, MAJOR (ICD-296.20) Family Hx of DISEASE, CARDIOVASCULAR NOS (ICD-429.2) ABDOMINAL PAIN, SUPRAPUBIC (ICD-789.09) PROSTATITIS NOS (ICD-601.9) PANIC ATTACKS (ICD-300.01) ANXIETY (ICD-300.00) ABDOMINAL PAIN, SUPRAPUBIC (ICD-789.09) PROSTATITIS NOS (ICD-601.9) PANIC ATTACKS (ICD-300.01) GASTROESOPHAGEAL REFLUX, NO ESOPHAGITIS (ICD-530.81) ANXIETY (ICD-300.00) Family Hx of DISEASE, CARDIOVASCULAR NOS (ICD-429.2) Told he has a RBBB Hiatal hernia Irritable bowel syndrome per Dr. 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 Cardiolite- no reversible ischemia, but +  EKG changes - 02/04/2002 Cath- EF55-60% small patent vessels - 06/08/2003 Colonoscopy- IBS otherwise wnl - 07/05/2005 EGD -nl - 12/05/2004 2D Echo - normal - 2009. 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  Family  History: Reviewed history from 05/28/2007 and no changes required. 2 Brothers and 2 Sisters in good health, MGM- died at 27 of hodgkin's, MGF: died in 18s, Mother- alive, MI in her 74`s and PTCA and 3 stents, CHF  Pyloric stenosis runs in family, heart disease, hodgkin's disease, anxiety  ?dx of bipolar in daughter??  Social History: Reviewed history from 04/18/2007 and no changes required. Brick mason x 50yrs.  He has nine children, 2 from first marriage ages 26 and 9, and 20 from 52rd, age range 68mo to 46 yo.  Currently on 3rd marriage for last 4 years.  Seven children live with him which cause him significant stress. Denies smoking, EtOH, recreational drugs, + remote h/o MJ 15 yrs ago.Passive Smoke Exposure:  yes  Review of Systems       per HPI  Physical Exam  General:  Well-developed,well-nourished,in no acute distress; alert,appropriate and cooperative throughout examination Lungs:  normal respiratory effort, normal breath sounds, no crackles, and no wheezes.   Heart:  normal rate, regular rhythm, no murmur, no gallop, and no rub.  no JVD Msk:  R hand - mild swelling with deformity dorsal hand along neck of 5th MC. Minimal malrotation.  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  made custom orthotic by OT per Dr. Dallas Schimke [facility] but only used 1 day and never followed up for readjustment of device.  again encouraged use of brace.  pt not concerned about this issue.  would consider referal back to [facility] for f/u or reimaging but doubt this would change pt's plan of action.  Orders: [facility]- Est  Level 4 ([address])  Problem # 2:  ESSENTIAL HYPERTENSION, BENIGN (ICD-401.1) never truly hypertensive in office.  on metoprolol 50 XL per cardiologist.  HR running in  50-60s, per patient down to 40s at night.  stop metoprolol and start HCTZ.  check BMP in 2 wks.  pt very worried about this.    The following medications were removed from the medication list:    Metoprolol Succinate 50 Mg Xr24h-tab (Metoprolol succinate) .[name]... 1 tab once daily His updated medication list for this problem includes:    Hydrochlorothiazide 12.5 Mg Caps (Hydrochlorothiazide) .[name]... Take one daily for blood pressure  BP today: 127/88 Prior BP: 131/80 (09/06/2009)  Labs Reviewed: K+: 4.3 (12/30/2008) Creat: : 0.93 (12/30/2008)   Chol: 145 (06/14/2009)   HDL: 42 (06/14/2009)   LDL: 86 (06/14/2009)   TG: 87 (06/14/2009)  Orders: [facility]- Est  Level 4 ([address])Future Orders: Basic Met-[facility] ([id number]) ... 10/12/2010 TSH-[facility] ([id number]) ... 10/18/2010  Problem # 3:  ALLERGIC RHINITIS, SEASONAL (ICD-477.0) well controlled on zyrtec/flonase.  Problem # 4:  UNSPECIFIED CONDITION OF BRAIN (ICD-348.9) encephalomalacia per CT scan done in ER in 2010 with possible remote parietal infarct.  tried to send to neurology for this, pt unable to go 2/2 finances.   Problem # 5:  PALPITATIONS (ICD-785.1) Saw Dr. Daleen Squibb for dizziness and palpitations, negative work  up.  normal echo 2009.  The following medications were removed from the medication list:    Metoprolol Succinate 50 Mg Xr24h-tab (Metoprolol succinate) .[name]... 1 tab once daily  Problem # 6:  GASTROESOPHAGEAL REFLUX, NO ESOPHAGITIS (ICD-530.81) intermittent complaint, takes PPI on and off.  Problem # 7:  CHEST PAIN (ICD-786.50) Cath- EF55-60% small patent vessels - 06/08/2003.  Dr. Daleen Squ[facility] of [facility] is Cardiologist.  normal echo 2009.  Problem # 8:  ANXIETY (ICD-300.00) previously on celexa but caused fatigue.  trial of zoloft.  pt requests xanax, I told him I do not use that medicine for panic attacks/anxiety. His updated medication list for this problem includes:    Zoloft 50 Mg Tabs (Sertraline hcl) .[name]... Take one daily  Problem #  9:  PROSTATITIS NOS (ICD-601.9) h/o nonspecific abd pain, has seen GI with normal colonoscopy 2005, nl EGD 2004, dx with IBS.  Has seen urology with normal uroscopy and thought to have prostatitis, treatd with abx back in 2008.  hasn't been issue since.  Complete Medication List: 1)  Aspirin 81 Mg Tbec (Aspirin) .... Take one daily 2)  Zyrtec Allergy 10 Mg Tabs (Cetirizine hcl) .... Take one nightly for allergies 3)  Flonase 50 Mcg/act Susp (Fluticasone propionate) .... 2 puffs into each nostril daily 4)  Hydrochlorothiazide 12.5 Mg Caps (Hydrochlorothiazide) .... Take one daily for blood pressure 5)  Zoloft 50 Mg Tabs (Sertraline hcl) .... Take one daily  Patient Instructions: 1)  Stop metoprolol. 2)  Start hydrochlorothiazide 12.5mg  daily for blood pressure. 3)  Keep using the hand splint for protection. 4)  Start zoloft for anxiety.   Prescriptions: ZOLOFT 50 MG TABS (SERTRALINE HCL) take one daily  #31 x 3   Entered and Authorized by:   Eustaquio Boyden  MD   Signed by:   Eustaquio Boyden  MD on 10/19/2009   Method used:   Faxed to ...       [facility] (retail)       [address] [place], [place]  [address]       Ph: [phone]       Fax: [phone]   RxID:   [id number] HYDROCHLOROTHIAZIDE 12.5 MG CAPS (HYDROCHLOROTHIAZIDE) take one daily for blood pressure  #31 x 3   Entered and Authorized by:   Eustaquio Boyden  MD   Signed by:   Eustaquio Boyden  MD on 10/19/2009   Method used:   Faxed to ...       [facility] Department (retail)       [address] [place], [place]  [address]       Ph: [phone]       Fax: [phone]   RxID:   [id number]    Prevention & Chronic Care Immunizations   Influenza vaccine: Not documented   Influenza vaccine due: 01/05/2010    Tetanus booster: 05/08/1999: Done.    Pneumococcal vaccine: Not documented  Other Screening   Smoking status: never  (10/19/2009)  Lipids    Total Cholesterol: 145  (06/14/2009)   Lipid panel action/deferral: Deferred   LDL: 86  (06/14/2009)   LDL Direct: Not documented   HDL: 42  (06/14/2009)   Triglycerides: 87  (06/14/2009)   Lipid panel due: 03/10/2009  Hypertension   Last Blood Pressure: 127 / 88  (10/19/2009)   Serum creatinine: 0.93  (12/30/2008)   BMP action: Ordered   Serum potassium 4.3  (  12/30/2008)    Hypertension flowsheet reviewed?: Yes   Progress toward BP goal: At goal  Self-Management Support :   Personal Goals (by the next clinic visit) :      Personal blood pressure goal: 140/90  (12/31/2008)   Hypertension self-management support: Not documented    Hypertension self-management support not done because: Good outcomes  (12/31/2008)

## 2010-06-08 NOTE — Progress Notes (Signed)
Summary: phn msg   Phone Note Call from Patient Call back at [facility] Phone [phone]   Summary of Call: Feeling really bad on new bp meds. Initial call taken by: Clydell Hakim,  October 21, 2009 4:39 PM  Follow-up for Phone Call        C/O feeling really bad and dizzy, states it started after medication change, pulse 105 and BP 152/101, keeps a HA all the time but that is not a change for her, feels really shaky, due to time advised to have someone drive her and  go to UC or ED to be checked, denies sweating or chest pain, voiced understanding, to PCP Follow-up by: Gladstone Pih,  October 21, 2009 5:15 PM  Additional Follow-up for Phone Call Additional follow up Details #1::        noted.   Additional Follow-up by: Eustaquio Boyden  MD,  October 24, 2009 8:14 AM

## 2010-06-08 NOTE — Assessment & Plan Note (Signed)
Summary: f/up,tcb   Vital Signs:  Patient profile:   45 year old male Height:      65.75 inches Weight:      139 pounds BMI:     22.69 Temp:     98.1 degrees F oral Pulse rate:   56 / minute BP sitting:   129 / 84  (right arm) Cuff size:   regular  Vitals Entered By: Tessie Fass CMA, (June 14, 2009 4:03 PM) CC: F/U Is Patient Diabetic? No Pain Assessment Patient in pain? yes     Location: hips Intensity: 6   Primary Care Provider:  Eustaquio Boyden  MD  CC:  F/U.  History of Present Illness: CC: f/u issues, would like CPE.  1. SOB - endorses sleeping on 4 pillows at night.  This has been going on for months to years.  Also endorses SOB - "not enough room in chest for air to get in" and waking up gasping for breath.  Feels stomache bloated.  Did trial of prilosec for 30 days, no improvment.  Denies coughing, hemoptysis.  No h/o asthma, nonsmoker, no h/o asbestos exposure or fumes at work.  + Research scientist (medical) dust.  Notes cigarette exposure worsens breathing.  2. hip pain - onset in 2000's.  no trauma or injury.  worse over last few months.  Started on left side, now both sides.  h/o bad disc in neck and back?  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  outer hip pain, worse with not exercising.  No time to exercise.  + 10 lb weight loss but also states recently had flu over last 2 wks and attributes weight loss to this.  Denise f/c/NS.  Habits & Providers  Alcohol-Tobacco-Diet     Tobacco Status: never  Current Medications (verified): 1)  Metoprolol Succinate 50 Mg Xr24h-Tab (Metoprolol Succinate) .[name].. 1 Tab Once Daily 2)  Aspirin 81 Mg Tbec (Aspirin) .... Take One Daily  Allergies (verified): 1)  Penicillin  Past History:  Past medical, surgical, family and social histories (including risk factors) reviewed for relevance to current acute and chronic problems.  Past Medical History: Reviewed history from 12/07/2008 and no changes required. ESSENTIAL HYPERTENSION,  BENIGN (ICD-401.1) CHEST PAIN (ICD-786.50) PALPITATIONS (ICD-785.1) DIZZINESS (ICD-780.4) GASTROESOPHAGEAL REFLUX, NO ESOPHAGITIS (ICD-530.81) BACK PAIN (ICD-724.5) PAIN IN JOINT PELVIC REGION AND THIGH (ICD-719.45) PAIN IN JOINT, HAND (ICD-719.44) FAMILY STRESS (ICD-V61.9) DEPRESSION, MAJOR (ICD-296.20) Family Hx of DISEASE, CARDIOVASCULAR NOS (ICD-429.2) ABDOMINAL PAIN, SUPRAPUBIC (ICD-789.09) PROSTATITIS NOS (ICD-601.9) PANIC ATTACKS (ICD-300.01) ANXIETY (ICD-300.00) ABDOMINAL PAIN, SUPRAPUBIC (ICD-789.09) PROSTATITIS NOS (ICD-601.9) PANIC ATTACKS (ICD-300.01) GASTROESOPHAGEAL REFLUX, NO ESOPHAGITIS (ICD-530.81) ANXIETY (ICD-300.00) 9/08 FLP, TC 200, TG 158, HDL 60, LDL 108 Family Hx of DISEASE, CARDIOVASCULAR NOS (ICD-429.2) Told he has a RBBB Hiatal hernia Irritable bowel syndrome per Dr. Leone Payor ?Functional Pain syndrome  Past Surgical History: Reviewed history from 12/30/2008 and no changes required. 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 2 Brothers and 2 Sisters in good health, MGM- died at 35 of hodgkin's, MGF: died in 42s, Mother- alive, MI in her 36`s and PTCA and 3 stents, CHF  Pyloric stenosis  runs in family, heart disease, hodgkin's disease, anxiety  ?dx of bipolar in daughter??  Social History: Reviewed history from 04/18/2007 and no changes required. 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  Currently on 3rd marriage for last 2 years.  Seven  children live with him which cause him significant stress. Denies smoking, EtOH, recreational drugs, + h/o MJ 15 yrs ago.  Physical Exam  General:  alert, well-developed, and well-nourished.   Lungs:  normal respiratory effort, normal breath sounds, no crackles, and no wheezes.   Heart:  normal rate, regular rhythm, no murmur, no gallop, and no rub.  no JVD Abdomen:  Bowel sounds positive,abdomen soft and non-tender without masses, organomegaly or hernias noted. Msk:  nontender SI joint, greater trochanteric bursa bilaterally.  no hip pain at internal/external rotation. + tender with crossover test bilaterally L>R  + mild tenderness to palpation just posterior to trochanteric bursa at gluteal region bilaterally  Of note - able to lay flat on exam table during entire MSK exam. Extremities:  No edema   Impression & Recommendations:  Problem # 1:  ESSENTIAL HYPERTENSION, BENIGN (ICD-401.1)  only on metoprolol occasionally.  advised continued use of meds.  advised to restart ASA. The following medications were removed from the medication list:    Zestril 10 Mg Tabs (Lisinopril) .[name]... Take one by mouth daily His updated medication list for this problem includes:    Metoprolol Succinate 50 Mg Xr24h-tab (Metoprolol succinate) .[name]... 1 tab once daily  BP today: 129/84 Prior BP: 142/87 (03/17/2009)  Labs Reviewed: K+: 4.3 (12/30/2008) Creat: : 0.93 (12/30/2008)   Chol: 147 (03/10/2008)   HDL: 47.4 (03/10/2008)   LDL: 87 (03/10/2008)   TG: 63 (03/10/2008)  Orders: [facility]- Est  Level 4 (69629)  Problem # 2:  MUSCLE STRAIN, LEFT BUTTOCK (ICD-848.8)  bilateral.  unsure exact etiology.  have advised pt do stretching exercises and start exercising again - may be from deconditioning?  Pt left before I could hand out instructions and show exercises to perform so mailed letter with pt instructions and exercises highlighted to extend legs at hips posteriorly while standing.  pt states he doesn't want  meds today for this.  Orders: [facility]- Est  Level 4 ([address]41)  Problem # 3:  DYSPNEA (ICD-786.05) no evidence to point towards CHF on exam.  No JVD, no dependent edema.  Advised patient continue to monitor this, if worsening, needs to be seen again for evaluation, consider echo vs sleep study vs PFTs?  Cath 2005 with small patent vessels.  Orders: [facility]- Est  Level 4 (32440)  Complete Medication List: 1)  Metoprolol Succinate 50 Mg Xr24h-tab (Metoprolol succinate) .[name].. 1 tab once daily 2)  Aspirin 81 Mg Tbec (Aspirin) .... Take one daily  Patient Instructions: 1)  Please return in 2-4 weeks to discuss blood work results. 2)  You've lost 10 pounds since last time I saw you.  This may just have been the flu.  If you continue to lose weight, or aren't gaining weight again, please return to be seen again to see why this is the case. 3)  We've checked blood work today (cholesterol). 4)  You may have a gluteal strain.  This may be due to deconditioning.  Try the strengthening exercises that we discussed today.  I think that starting exercising will help you. 5)  Call clinic with questions. Good to see  you today.

## 2010-06-08 NOTE — Progress Notes (Signed)
----   Converted from flag ---- ---- 06/16/2009 9:52 AM, Eustaquio Boyden  MD wrote: please send letter to patient ------------------------------ letter mailed.

## 2010-06-08 NOTE — Assessment & Plan Note (Signed)
Summary: FU HAND FX   Vital Signs:  Patient profile:   45 year old male BP sitting:   131 / 80  Vitals Entered By: Lillia Pauls CMA (Sep 06, 2009 1:43 PM)  History of Present Illness: f/u fx  2. broke hand - punched dresser and broke hand.  Went to [facility]. Has been noncompliant with splinting. Saw PCP at [facility] and Dr. Donalee Citrin. Hand consult attempted, but no one would see this gentleman.    Referred to [facility] for eval.  Films Reviewed: Evidence for a minimally displaced apex dorsal angulation 5th MT fracture. Xray date 08/16/2009  I attempted to get XRays, ordered prior to his arrival, but he declined.  DOI 08/15/2009  Allergies: 1)  Penicillin  Past History:  Past medical, surgical, family and social histories (including risk factors) reviewed, and no changes noted (except as noted below).  Past Medical History: Reviewed history from 12/07/2008 and no changes required. ESSENTIAL HYPERTENSION, BENIGN (ICD-401.1) CHEST PAIN (ICD-786.50) PALPITATIONS (ICD-785.1) DIZZINESS (ICD-780.4) GASTROESOPHAGEAL REFLUX, NO ESOPHAGITIS (ICD-530.81) BACK PAIN (ICD-724.5) PAIN IN JOINT PELVIC REGION AND THIGH (ICD-719.45) PAIN IN JOINT, HAND (ICD-719.44) FAMILY STRESS (ICD-V61.9) DEPRESSION, MAJOR (ICD-296.20) Family Hx of DISEASE, CARDIOVASCULAR NOS (ICD-429.2) ABDOMINAL PAIN, SUPRAPUBIC (ICD-789.09) PROSTATITIS NOS (ICD-601.9) PANIC ATTACKS (ICD-300.01) ANXIETY (ICD-300.00) ABDOMINAL PAIN, SUPRAPUBIC (ICD-789.09) PROSTATITIS NOS (ICD-601.9) PANIC ATTACKS (ICD-300.01) GASTROESOPHAGEAL REFLUX, NO ESOPHAGITIS (ICD-530.81) ANXIETY (ICD-300.00) 9/08 FLP, TC 200, TG 158, HDL 60, LDL 108 Family Hx of DISEASE, CARDIOVASCULAR NOS (ICD-429.2) Told he has a RBBB Hiatal hernia Irritable bowel syndrome per Dr. Leone Payor ?Functional Pain syndrome  Past Surgical History: Reviewed history from 12/30/2008 and no changes required. 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 2 Brothers and 2 Sisters in good health, MGM- died at 20 of hodgkin's, MGF: died in 67s, Mother- alive, MI in her 54`s and PTCA and 3 stents, CHF  Pyloric stenosis runs in family, heart disease, hodgkin's disease, anxiety  ?dx of bipolar in daughter??  Social History: Reviewed history from 04/18/2007 and no changes required. 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  Currently on 3rd marriage for last 2 years.  Seven children live with him which cause him significant stress. Denies smoking, EtOH, recreational drugs, + h/o MJ 15 yrs ago.  Review of Systems       REVIEW OF SYSTEMS  GEN: No systemic complaints, no fevers, chills, sweats, or other acute illnesses MSK: Detailed in the HPI GI: tolerating PO intake without difficulty Neuro: No numbness, parasthesias, or tingling associated. Otherwise the pertinent positives of the ROS are noted above.    Physical Exam  General:  GEN: Well-developed,well-nourished,in no acute distress; alert,appropriate and cooperative throughout examination HEENT: Normocephalic and atraumatic without obvious abnormalities. No apparent alopecia or balding. Ears, externally no deformities PULM: Breathing comfortably in no respiratory distress EXT: No clubbing, cyanosis, or edema PSYCH: Normally interactive. Cooperative during the interview. Pleasant. Friendly and conversant. Not anxious or depressed appearing. Normal, full affect.  Msk:  R hand - swelling dorsal hand  along neck of 5th MC. Lack of approx 20 degrees of full flexion of the 5th. Minimal malrotation.   Mild edema. No significant bruising.  o/w NT except around 5th.  L hand - WNL   Impression & Recommendations:  Problem # 1:  CLOSED FRACTURE OF NECK OF METACARPAL BONE (ICD-815.04) Obtain plain films to assess integrity of fracture.  Noncompliant patient. Discussed he puts himself at risk for permanent disability without immobilization and compliance. Fracture movement is higher risk in this case due to compliance.  Acceptable alignment for nonoperative management.  Called [facility] OT and discussed custom hand splint fabrication for boxer's fracture, and they agreed. Will do today.  Complete Medication List: 1)  Metoprolol Succinate 50 Mg Xr24h-tab (Metoprolol succinate) .[name].. 1 tab once daily 2)  Aspirin 81 Mg Tbec (Aspirin) .... Take one daily 3)  Zyrtec Allergy 10 Mg Tabs (Cetirizine hcl) .... Take one nightly for allergies 4)  Flonase 50 Mcg/act Susp (Fluticasone propionate) .... 2 puffs into each nostril daily  Other Orders: Radiology other (Radiology Other)  Patient Instructions: 1)  f/u 3 weeks

## 2010-06-08 NOTE — Assessment & Plan Note (Signed)
Summary: meds/kh   Vital Signs:  Patient profile:   45 year old male Height:      65.75 inches Weight:      154 pounds BMI:     25.14 Pulse rate:   73 / minute BP sitting:   138 / 84  (left arm) Cuff size:   regular  Vitals Entered By: Tessie Fass CMA (May 09, 2010 10:16 AM) CC: F/U, Hypertension Management Is Patient Diabetic? No Pain Assessment Patient in pain? no        Primary Care Provider:  Doree Albee MD  CC:  F/U and Hypertension Management.  History of Present Illness: Mood: Has been overall stable per pt. No HI/SI. Panic attacks have been stable, occurring a few times a month. Feels mood is better now that he is working again. Has considered medication for anxiety and mood, but has falied many medication in the past per pt. Still considering psychotherapy.   Hypertension History:      He denies headache, chest pain, palpitations, dyspnea with exertion, orthopnea, PND, and visual symptoms.  Further comments include: Has been compliant on metoprolol per pt. Needs refill. has been going to the gum for exercise. Running on treadmill, lifting weights. .        Positive major cardiovascular risk factors include male age 63 years old or older and hypertension.  Negative major cardiovascular risk factors include non-tobacco-user status.     Habits & Providers  Alcohol-Tobacco-Diet     Tobacco Status: quit  Allergies: 1)  ! * Hctz 2)  Penicillin  Social History: Smoking Status:  quit  Physical Exam  General:  alert and well-developed.   Eyes:  vision grossly intact.   Neck:  supple and full ROM.   Lungs:  CTAB, no wheezes, rales, rhoncii Heart:  RRR, no rubs, gallops, murmurs  Extremities:  2+ peripheral pulses, no edema Neurologic:  alert & oriented X3.   Psych:  Oriented X3, normally interactive, good eye contact, not anxious appearing, and not depressed appearing.   PHQ-9 score- 2   Impression & Recommendations:  Problem # 1:  ESSENTIAL  HYPERTENSION, BENIGN (ICD-401.1) Will refill metoprolol. Will also start on maczide for BP control. Will check baseline labs. Will have pt return in 2 weeks.  His updated medication list for this problem includes:    Metoprolol Succinate 50 Mg Xr24h-tab (Metoprolol succinate) .[name]... 1/2 tab once daily    Triamterene-hctz 37.5-25 Mg Tabs (Triamterene-hctz) .[name]... 1/2 tablet daily  Orders: Basic Met-[facility] ([id number])  Problem # 2:  DEPRESSION, MAJOR (ICD-296.20) Pt with longstanding hx/o medical noncompliance and poor followup. Overall mood currently stable. PHQ 9 score- 2. Will hold on medication given noncompliance. Would like for pt to be formally seen by psychiatry vs. [facility] (Dr. Pascal Lux). Will try to broach this issue at next clinic visit. Discussed psych red flags.  Will folowup in 2 weeks.   Complete Medication List: 1)  Aspirin 81 Mg Tbec (Aspirin) .... Take one daily 2)  Zyrtec Allergy 10 Mg Tabs (Cetirizine hcl) .... Take one nightly for allergies 3)  Flonase 50 Mcg/act Susp (Fluticasone propionate) .... 2 puffs into each nostril daily 4)  Metoprolol Succinate 50 Mg Xr24h-tab (Metoprolol succinate) .... 1/2 tab once daily 5)  Alprazolam 0.5 Mg Tabs (Alprazolam) .... As needed 6)  Triamterene-hctz 37.5-25 Mg Tabs (Triamterene-hctz) .... 1/2 tablet daily  Hypertension Assessment/Plan:      The patient's hypertensive risk group is category B: At least one risk factor (excluding diabetes)  with no target organ damage.  His calculated 10 year risk of coronary heart disease is 4 %.  Today's blood pressure is 138/84.  His blood pressure goal is < 140/90.  Patient Instructions: 1)  It was good to see you today 2)  I will chck some labs for your blood pressure 3)  I prescribed you a new medication for your blood pressure 4)  If have any suicidal thoughts, give [place] a call or go to the ED 5)  Otherwise call with any questions 6)  Happy New Year and God [place], 7)  Doree Albee MD   Prescriptions: [name] 37.5-25 MG TABS (TRIAMTERENE-HCTZ) 1/2 tablet daily  #30 x 1   Entered and Authorized by:   Doree Albee MD   Signed by:   Doree Albee MD on 05/14/2010   Method used:   Electronically to        [facility] [phone]* (retail)       [address]       [place], [place]  [address]       Ph: [phone]       Fax: [phone]   RxID:   [id number]    Orders Added: 1)  Basic Met-[facility] [[id number]]  Appended Document: meds/kh     Clinical Lists Changes  Orders: Added new Test order of [facility]- Est Level  3 ([address]644) - Signed

## 2010-06-08 NOTE — Letter (Signed)
Summary: Probation Letter  [facility]  [address]   [place], [place] [address]   Phone: [phone]  Fax: [phone]    03/15/2010  RION CATALA [address] [place], [place]  [address]  Dear Mr. [facility],  With the goal of better serving all our patients the [facility] is following each patient's missed appointments.  You have missed at least 3 appointments with our practice.If you cannot keep your appointment, we expect you to call at least 24 hours before your appointment time.  Missing appointments prevents other patients from seeing [place] and makes it difficult to provide you with the best possible medical care.      1.   If you miss one more appointment, we will only give you limited medical services. This means we will not call in medication refills, complete a form, or make a referral for you except when you are here for a scheduled office visit.    2.   If you miss 2 or more appointments in the next year, we will dismiss you from our practice.    Our office staff can be reached at [phone] Monday through Friday from 8:30 a.m.-5:00 p.m. and will be glad to schedule your appointment as necessary.    Thank you.   [facility]  Appended Document: Probation Letter letter mailed

## 2010-06-09 ENCOUNTER — Encounter: Payer: Self-pay | Admitting: Family Medicine

## 2010-06-09 ENCOUNTER — Ambulatory Visit: Admit: 2010-06-09 | Payer: Self-pay

## 2010-06-09 ENCOUNTER — Ambulatory Visit: Payer: Self-pay | Admitting: Family Medicine

## 2010-06-09 DIAGNOSIS — M549 Dorsalgia, unspecified: Secondary | ICD-10-CM

## 2010-06-09 HISTORY — DX: Dorsalgia, unspecified: M54.9

## 2010-06-22 NOTE — Assessment & Plan Note (Signed)
Summary: f/u eo   Allergies: 1)  ! * Hctz 2)  Penicillin   Complete Medication List: 1)  Aspirin 81 Mg Tbec (Aspirin) .... Take one daily 2)  Zyrtec Allergy 10 Mg Tabs (Cetirizine hcl) .... Take one nightly for allergies 3)  Flonase 50 Mcg/act Susp (Fluticasone propionate) .... 2 puffs into each nostril daily 4)  Alprazolam 0.5 Mg Tabs (Alprazolam) .... As needed 5)  Triamterene-hctz 37.5-25 Mg Tabs (Triamterene-hctz) .... 1/2 tablet daily 6)  Wellbutrin 100 Mg Tabs (Bupropion hcl) .[name].. 1 tablet twice daily 7)  Propranolol Hcl 40 Mg Tabs (Propranolol hcl) .[name].. 1 tab by mouth bid  Patient Instructions: 1)  It was good to see you today 2)  I will also prescribe you a medication for your mood  3)  STOP the metoprolol  4)  START the propranolol as prescribed  5)  If have any suicidal thoughts, give [place] a call or go to the ED 6)  Otherwise call with any questions 7)  Come back to see me in 2 weeks.  8)  Happy New Year and God [place], 25)  Doree Albee MD  Prescriptions: PROPRANOLOL HCL 40 MG TABS (PROPRANOLOL HCL) 1 tab by mouth bid  #60 x 1   Entered and Authorized by:   Doree Albee MD   Signed by:   Doree Albee MD on 06/09/2010   Method used:   Electronically to        [facility] [phone]* (retail)       [address]       [place], [place]  [address]       Ph: [phone]       Fax: [phone]   RxID:   [id number] WELLBUTRIN 100 MG TABS (BUPROPION HCL) 1 tablet twice daily  #30 x 1   Entered and Authorized by:   Doree Albee MD   Signed by:   Doree Albee MD on 06/09/2010   Method used:   Electronically to        [facility] [phone]* (retail)       [address]       [place], [place]  [address]       Ph: [phone]       Fax: [phone]   RxID:   [id number]   Appended Document: f/u eo Duplicate office visit made in error. Doree Albee MD

## 2010-06-22 NOTE — Assessment & Plan Note (Signed)
Summary: f/up per dr.Delonda Coley/ts   Vital Signs:  Patient profile:   45 year old male Weight:      156.7 pounds Pulse rate:   82 / minute BP sitting:   133 / 82  (right arm)  Vitals Entered By: Arlyss Repress CMA, (June 09, 2010 1:48 PM) CC: back pain after exercise on tuesday and f/up per dr.Lonnetta Kniskern, Hypertension Management Is Patient Diabetic? No Pain Assessment Patient in pain? yes     Location: lower back Intensity: 10   Primary Care Provider:  Doree Albee MD  CC:  back pain after exercise on tuesday and f/up per dr.Orabelle Rylee and Hypertension Management.  History of Present Illness: Mood: Overall improved. Has not taken wellbutrin since last clinical visit. has been exercising. Feels that exercise has helped with mood. Still having issues with sleep, intermittent heart racing. Has had normal stress test and ECHO within last 5-6 year. Has also been cleared from cardiology.   Back Pain: Pt reports recent episode of attempting squats while exercising when pt noticed significant back pain after 1st attempt. No LOC, hemiparesis, numbness. Pain most prominent in lumbar area. Pain most prominent with back extension. Overall are feels generally tight. Pt does report radiaiton of pain down leg. Pt reports h/o chronic back pain. Was seen at urgent care yesterday. L spine x-rays obtained with images showing degenerative changes at L5-S1, but no acute findings. Pt was given rx for vicodin with minimal improvement in sxs. Per pt "vicodin just doesnt make me feel right". Pt reports prior hx/o confusion/AMS with taking demerol.    Hypertension History:      He complains of headache, chest pain, palpitations, and PND, but denies peripheral edema, visual symptoms, neurologic problems, and syncope.  Further comments include: Sxs longstanding. Has had extensive cardiology work up in the past. Was recently cleared from cardiology and describes pain/sxs as being noncardiac/assd with anxiety. Has not been  checking BPs at home. Has not taken propranolol, is still taking metoprolol as previously prescribed. Has not been taking maczide. [name]        Positive major cardiovascular risk factors include male age 58 years old or older and hypertension.  Negative major cardiovascular risk factors include non-tobacco-user status.     Habits & Providers  Alcohol-Tobacco-Diet     Tobacco Status: never  Allergies: 1)  ! * Hctz 2)  Penicillin  Social History: Smoking Status:  never  Physical Exam  General:  alert and well-developed.  alert and well-developed.   Head:  normocephalic and atraumatic.  normocephalic and atraumatic.   Eyes:  vision grossly intact.  vision grossly intact.   Neck:  supple and full ROM.  supple and full ROM.   Lungs:  normal respiratory effort and no wheezes.  normal respiratory effort and no wheezes.   Heart:  normal rate, regular rhythm, and no murmur.  normal rate, regular rhythm, and no murmur.   Abdomen:  soft, non-tender, and normal bowel sounds.  soft, non-tender, and normal bowel sounds.   Msk:  + tenderness to palpation along lumbar region bilaterally. + Pain with back flexion.  No numbness/weakness in distal extremities bilaterally No gait instability Passive straight leg raise normal bilaterally    Impression & Recommendations:  Problem # 1:  BACK PAIN, CHRONIC (ICD-724.5) Likely lumbago/lumbar back strain given mechanism of onset. Toradol shot given in clinic. Will also give rx for ultracet. Encouraged d/c of vicodin given abnormal reaction. Will also refer to [facility] given chronicity of sxs. Currently  very low concern for sciatica/radicular type pain given exam. Will hold on treatment. Pt agreeable to plan.  His updated medication list for this problem includes:    Aspirin 81 Mg Tbec (Aspirin) .[name]... Take one daily    Ultracet 37.5-325 Mg Tabs (Tramadol-acetaminophen) .[name]... 1 tablet every 6-8 hours as needed for pain  Orders: Ketorolac-Toradol 15mg   (G4010) Sports Medicine (Sports Med) [facility]- Est  Level 4 ([address]253)  His updated medication list for this problem includes:    Aspirin 81 Mg Tbec (Aspirin) .[name]... Take one daily    Ultracet 37.5-325 Mg Tabs (Tramadol-acetaminophen) .[name]... 1 tablet every 6-8 hours as needed for pain  Problem # 2:  ESSENTIAL HYPERTENSION, BENIGN (ICD-401.1)  Currently well controllled. Reitirated transition to proranolol and Maxzide for BP control. (propranolol for concominant HA ppx) His updated medication list for this problem includes:    Triamterene-hctz 37.5-25 Mg Tabs (Triamterene-hctz) .[name]... 1/2 tablet daily    Propranolol Hcl 40 Mg Tabs (Propranolol hcl) .[name]... 1 tab by mouth bid  His updated medication list for this problem includes:    Triamterene-hctz 37.5-25 Mg Tabs (Triamterene-hctz) .[name]... 1/2 tablet daily    Propranolol Hcl 40 Mg Tabs (Propranolol hcl) .[name]... 1 tab by mouth bid  Orders: [facility]- Est  Level 4 (66440)  Problem # 3:  ANXIETY (ICD-300.00)  Curently stable. Stressed importance of taking wellbutrin. Multiple complaints of palpitations, sweating, tingling likely secondary to uncontrolled anxiety attacks. Has had extensive cardiac workup in the past for this. Hopefully propranolol may also help mitigate these sxs.  His updated medication list for this problem includes:    Alprazolam 0.5 Mg Tabs (Alprazolam) .[name]... As needed    Wellbutrin 100 Mg Tabs (Bupropion hcl) .[name]... 1 tablet twice daily  Orders: [facility]- Est  Level 4 (34742)  Complete Medication List: 1)  Aspirin 81 Mg Tbec (Aspirin) .... Take one daily 2)  Zyrtec Allergy 10 Mg Tabs (Cetirizine hcl) .... Take one nightly for allergies 3)  Flonase 50 Mcg/act Susp (Fluticasone propionate) .... 2 puffs into each nostril daily 4)  Alprazolam 0.5 Mg Tabs (Alprazolam) .... As needed 5)  Triamterene-hctz 37.5-25 Mg Tabs (Triamterene-hctz) .... 1/2 tablet daily 6)  Wellbutrin 100 Mg Tabs (Bupropion hcl) .[name].. 1 tablet twice daily 7)  Propranolol  Hcl 40 Mg Tabs (Propranolol hcl) .[name].. 1 tab by mouth bid 8)  Ultracet 37.5-325 Mg Tabs (Tramadol-acetaminophen) .[name].. 1 tablet every 6-8 hours as needed for pain  Hypertension Assessment/Plan:      The patient's hypertensive risk group is category B: At least one risk factor (excluding diabetes) with no target organ damage.  His calculated 10 year risk of coronary heart disease is 4 %.  Today's blood pressure is 133/82.  His blood pressure goal is < 140/90.  Patient Instructions: 1)  It was good to see you today 2)  Continue the triamerene-HCTZ 3)  I will also prescribe you a medication for your mood  4)  STOP the metoprolol  5)  START the propranolol as prescribed  6)  START the wellbutrin  7)  I am referring you to sports medicine for your back pain  8)  Take the ultracet for back pain 9)  If have any suicidal thoughts, give [place] a call or go to the ED 10)  Otherwise call with any questions 11)  Come back to see me in 2 weeks.  12)  Happy New Year and God [place], [place])  Doree Albee MD  Prescriptions: Alphonzo Severance 37.5-325 MG TABS (TRAMADOL-ACETAMINOPHEN) 1  tablet every 6-8 hours as needed for pain  #120 x 0   Entered and Authorized by:   Doree Albee MD   Signed by:   Doree Albee MD on 06/09/2010   Method used:   Electronically to        [facility] [phone]* (retail)       [address]       [place], [place]  [address]       Ph: [phone]       Fax: [phone]   RxID:   [id number]    Medication Administration  Injection # 1:    Medication: Ketorolac-Toradol 15mg     Diagnosis: BACK PAIN, CHRONIC (ICD-724.5)    Route: IM    Site: L deltoid    Exp Date: 09/05/2011    Lot #: [id number]    Mfr: [email or web address]    Comments: 30mg  given    Patient tolerated injection without complications    Given by: Arlyss Repress CMA, (June 09, 2010 2:39 PM)  Orders Added: 1)  Ketorolac-Toradol 15mg  [J1885] 2)  Sports Medicine [Sports Med] 3)  [facility]- Est  Level 4  [[address]]     Medication Administration  Injection # 1:    Medication: Ketorolac-Toradol 15mg     Diagnosis: BACK PAIN, CHRONIC (ICD-724.5)    Route: IM    Site: L deltoid    Exp Date: 09/05/2011    Lot #: [id number]    Mfr: [email or web address]    Comments: 30mg  given    Patient tolerated injection without complications    Given by: Arlyss Repress CMA, (June 09, 2010 2:39 PM)  Orders Added: 1)  Ketorolac-Toradol 15mg  [J1885] 2)  Sports Medicine [Sports Med] 3)  [facility]- Est  Level 4 [66440]

## 2010-07-03 ENCOUNTER — Encounter: Payer: Self-pay | Admitting: Family Medicine

## 2010-07-03 ENCOUNTER — Ambulatory Visit (INDEPENDENT_AMBULATORY_CARE_PROVIDER_SITE_OTHER): Payer: Self-pay | Admitting: Family Medicine

## 2010-07-03 VITALS — BP 127/94 | HR 71 | Temp 97.9°F | Ht 65.0 in | Wt 158.0 lb

## 2010-07-03 DIAGNOSIS — Z23 Encounter for immunization: Secondary | ICD-10-CM

## 2010-07-03 DIAGNOSIS — I10 Essential (primary) hypertension: Secondary | ICD-10-CM

## 2010-07-03 MED ORDER — TETANUS-DIPHTH-ACELL PERTUSSIS 5-2.5-18.5 LF-MCG/0.5 IM SUSP
0.5000 mL | Freq: Once | INTRAMUSCULAR | Status: AC
  Administered 2010-07-03: 0.5 mL via INTRAMUSCULAR

## 2010-07-03 NOTE — Patient Instructions (Signed)
Resume taking the wellbutrin and and the dyazide as previously prescribed  Continue the aspirin daily  Come back to see me 2 weeks to follow up on your mood  Call with any questions, God Bless, Doree Albee MD

## 2010-07-04 NOTE — Progress Notes (Signed)
  Subjective:    Patient ID: Adam [name], male    DOB: 1966/05/01, 45 y.o.   MRN: [id number]  Hypertension This is a chronic problem. The current episode started more than 1 year ago. The problem has been gradually improving since onset. The problem is controlled. Associated symptoms include anxiety and headaches. There are no associated agents to hypertension. Risk factors for coronary artery disease include male gender, smoking/tobacco exposure and stress.  Pt states that he has not been taking maxzide since last clinic visit. "I just didn't wanna take it" per pt. Pt denies any muscle cramping, weakness, or palpitations. Pt states that he has been compliant with bid propranolol 40 mg. Has not been checking blood pressures regularly. Feels that daily exercise routine may be helping with blood pressure.     Review of Systems  Neurological: Positive for headaches.  All other systems reviewed and are negative.       Objective:   Physical Exam  Constitutional: He appears well-developed.  HENT:  Head: Normocephalic and atraumatic.  Cardiovascular: Normal rate and regular rhythm.   Pulmonary/Chest: Effort normal and breath sounds normal. He has no wheezes.  Abdominal: Soft. Bowel sounds are normal.  Musculoskeletal: Normal range of motion.          Assessment & Plan:

## 2010-07-05 NOTE — Assessment & Plan Note (Signed)
BP improved. Currently taking propranolol only. Patient has longstanding history of medical non-compliance. I would like to see what his blood pressure does on an actual stable regimen. Encouraged maxzide use. Pt may actually benefit from exercise in terms of BP control. Still, it would be good to see how pt tolerates a stable medication regimen.  Will follow up in 2 weeks.

## 2010-07-17 ENCOUNTER — Telehealth: Payer: Self-pay | Admitting: Cardiology

## 2010-07-19 ENCOUNTER — Ambulatory Visit: Payer: Self-pay | Admitting: Family Medicine

## 2010-07-19 LAB — CBC
MCHC: 34 g/dL (ref 30.0–36.0)
Platelets: 230 10*3/uL (ref 150–400)
RDW: 12.1 % (ref 11.5–15.5)
WBC: 8.6 10*3/uL (ref 4.0–10.5)

## 2010-07-19 LAB — BASIC METABOLIC PANEL
BUN: 11 mg/dL (ref 6–23)
Calcium: 8.8 mg/dL (ref 8.4–10.5)
Creatinine, Ser: 0.89 mg/dL (ref 0.4–1.5)
GFR calc non Af Amer: >60 mL/min (ref >60)
Glucose, Bld: 110 mg/dL — ABNORMAL HIGH (ref 70–99)
Potassium: 3.1 mEq/L — ABNORMAL LOW (ref 3.5–5.1)

## 2010-07-19 LAB — DIFFERENTIAL
Eosinophils Relative: 4 % (ref 0–5)
Lymphocytes Relative: 24 % (ref 12–46)
Lymphs Abs: 2.1 10*3/uL (ref 0.7–4.0)
Monocytes Absolute: 0.8 10*3/uL (ref 0.1–1.0)
Neutro Abs: 5.3 10*3/uL (ref 1.7–7.7)

## 2010-07-19 LAB — URINALYSIS, ROUTINE W REFLEX MICROSCOPIC
Hgb urine dipstick: NEGATIVE
Leukocytes, UA: NEGATIVE
Nitrite: NEGATIVE
Protein, ur: 30 mg/dL — AB
Specific Gravity, Urine: 1.028 (ref 1.005–1.030)
Urobilinogen, UA: 1 mg/dL (ref 0.0–1.0)

## 2010-07-19 LAB — URINE MICROSCOPIC-ADD ON

## 2010-07-24 LAB — POCT URINALYSIS DIP (DEVICE)
Glucose, UA: NEGATIVE mg/dL
Ketones, ur: NEGATIVE mg/dL
Specific Gravity, Urine: 1.02 (ref 1.005–1.030)
Urobilinogen, UA: 0.2 mg/dL (ref 0.0–1.0)

## 2010-07-24 LAB — POCT CARDIAC MARKERS
CKMB, poc: <1 ng/mL — ABNORMAL LOW (ref 1.0–8.0)
Troponin i, poc: <0.05 ng/mL (ref 0.00–0.09)

## 2010-07-25 LAB — COMPREHENSIVE METABOLIC PANEL
ALT: 24 U/L (ref 0–53)
AST: 19 U/L (ref 0–37)
Alkaline Phosphatase: 74 U/L (ref 39–117)
CO2: 27 mEq/L (ref 19–32)
Calcium: 8.9 mg/dL (ref 8.4–10.5)
Chloride: 108 mEq/L (ref 96–112)
GFR calc Af Amer: >60 mL/min (ref >60)
GFR calc non Af Amer: >60 mL/min (ref >60)
Glucose, Bld: 115 mg/dL — ABNORMAL HIGH (ref 70–99)
Sodium: 139 mEq/L (ref 135–145)
Total Bilirubin: 0.4 mg/dL (ref 0.3–1.2)

## 2010-07-25 LAB — DIFFERENTIAL
Basophils Absolute: 0.1 10*3/uL (ref 0.0–0.1)
Basophils Relative: 1 % (ref 0–1)
Eosinophils Absolute: 0.3 10*3/uL (ref 0.0–0.7)
Eosinophils Relative: 5 % (ref 0–5)
Neutrophils Relative %: 72 % (ref 43–77)

## 2010-07-25 LAB — POCT URINALYSIS DIP (DEVICE)
Bilirubin Urine: NEGATIVE
Glucose, UA: NEGATIVE mg/dL
Hgb urine dipstick: NEGATIVE
Nitrite: NEGATIVE
Specific Gravity, Urine: 1.01 (ref 1.005–1.030)
Urobilinogen, UA: 0.2 mg/dL (ref 0.0–1.0)

## 2010-07-25 LAB — CBC
Hemoglobin: 15.7 g/dL (ref 13.0–17.0)
MCHC: 35 g/dL (ref 30.0–36.0)
RBC: 5.22 MIL/uL (ref 4.22–5.81)
WBC: 7.1 10*3/uL (ref 4.0–10.5)

## 2010-07-25 LAB — LIPASE, BLOOD: Lipase: 31 U/L (ref 11–59)

## 2010-07-25 NOTE — Progress Notes (Signed)
Summary: question on meds   Phone Note Call from Patient Call back at Home Phone [phone]   Caller: Patient Reason for Call: Talk to Nurse Summary of Call: pt has question re his blood pressure meds Initial call taken by: Roe Coombs,  July 17, 2010 1:13 PM  Follow-up for Phone Call        spoke w/pts wife wants to make an appt w/Dr Larry Alcock to discuss BP and meds, appt sch for 3/30 at 12:15 Meredith Staggers, RN  July 17, 2010 1:58 PM

## 2010-07-28 LAB — POCT I-STAT, CHEM 8
BUN: 17 mg/dL (ref 6–23)
Calcium, Ion: 1.04 mmol/L — ABNORMAL LOW (ref 1.12–1.32)
Chloride: 107 mEq/L (ref 96–112)
HCT: 42 % (ref 39.0–52.0)
Sodium: 138 mEq/L (ref 135–145)

## 2010-07-28 LAB — CBC
HCT: 42.7 % (ref 39.0–52.0)
Platelets: 265 10*3/uL (ref 150–400)
RDW: 12.4 % (ref 11.5–15.5)
WBC: 7.5 10*3/uL (ref 4.0–10.5)

## 2010-07-31 ENCOUNTER — Encounter: Payer: Self-pay | Admitting: Family Medicine

## 2010-07-31 ENCOUNTER — Ambulatory Visit (INDEPENDENT_AMBULATORY_CARE_PROVIDER_SITE_OTHER): Payer: Self-pay | Admitting: Family Medicine

## 2010-07-31 DIAGNOSIS — I10 Essential (primary) hypertension: Secondary | ICD-10-CM

## 2010-07-31 DIAGNOSIS — F411 Generalized anxiety disorder: Secondary | ICD-10-CM

## 2010-07-31 DIAGNOSIS — M25512 Pain in left shoulder: Secondary | ICD-10-CM

## 2010-07-31 DIAGNOSIS — M25519 Pain in unspecified shoulder: Secondary | ICD-10-CM

## 2010-07-31 DIAGNOSIS — Z111 Encounter for screening for respiratory tuberculosis: Secondary | ICD-10-CM

## 2010-07-31 DIAGNOSIS — M25511 Pain in right shoulder: Secondary | ICD-10-CM

## 2010-07-31 DIAGNOSIS — R0601 Orthopnea: Secondary | ICD-10-CM

## 2010-07-31 MED ORDER — METOPROLOL SUCCINATE ER 25 MG PO TB24: 25.0000 mg | ORAL_TABLET | Freq: Every day | ORAL | Status: DC

## 2010-07-31 MED ORDER — TUBERCULIN PPD 5 UNIT/0.1ML ID SOLN
5.0000 [IU] | Freq: Once | INTRADERMAL | Status: AC
  Administered 2010-07-31: 5 [IU] via INTRADERMAL

## 2010-07-31 NOTE — Assessment & Plan Note (Addendum)
Stable. Pt transitioned off of propranolol back to metoprolol and dyazide. Previously on regimen without incident.

## 2010-07-31 NOTE — Assessment & Plan Note (Signed)
Smothering feeling likely from untreated anxiety. However, given cardiac family history will obtain ECHO to rule out ANY structural causes. Overall exam reassuring and I feel that BNP would be of little benefit given non-specificity of complaints whereas ECHO would give definitive structural findings to point anxiety as cause of orthopnea.

## 2010-07-31 NOTE — Assessment & Plan Note (Signed)
:  Longstanding hx/o medical non-compliance apart from xanax in setting of longstanding non-specific complaints. There is a concern for somatoform disorder/untreated anxiety in this patient. As to whether there is a malingering component to this presentation, I am not sure. Pt has history of medication (xanax) seeking behavior. However, is not asking for xanax.  Reiterated with pt importance of medication compliance. Will continue to follow.

## 2010-07-31 NOTE — Assessment & Plan Note (Signed)
No pathological findings on exam, with question of somatization. Will, however refer to PT as pt may have mild strain from exercise. Propranolol switched to metoprolol succinate as pt states that lon acting metoprolol did not cause headaches.

## 2010-07-31 NOTE — Progress Notes (Signed)
  Subjective:    Patient ID: Adam [name], male    DOB: Jun 03, 1965, 45 y.o.   MRN: [id number]  HPI Mood- Pt states that he is not taking wellbutrin and feels that he does not need medication. Still with insomnia. Intermittent anxiety issues.   Shoulder Pain - Pt recently switched from metoprolol to propranolol as pt reported headaches with medication. Pt states that propranolol has been causing significant bilateral shoulder pain.  Pt states that shoulder pain not present before taking medication. Pt does report daily exercise including weight training up until onset of shoulder pain. No parasthesias, weakness. No trauma from exercise per pt.   Orthopnea- Pt reports feeling of being smothered at night. No CP. Denies dyspnea w/ exertion. Can exercise without dyspnea. + Family hx/o early MI per pt w/ father having MI in 34s and Mother with MI in 74s.    HTN: Not checking blood pressures at home. Currently taking propranolol-HCTZ vs. Propranolol and dyazide as previously prescribed. No CP, HAs, per pt.     Review of Systems See HPI     Objective:   Physical Exam Gen: up in chair, NAD HEENT: NCAT, EOMI, TMs clear bilaterally CV: RRR, no murmurs auscultated PULM: CTAB, no wheezes, rales, rhoncii ABD: S/NT/+ bowel sounds  EXT: 2+ peripheral pulses  MSK: Empty Can negative bilaterally, strength 5/5 bilaterally    Assessment & Plan:  Mood:Longstanding hx/o medical non-compliance apart from xanax in setting of longstanding non-specific complaints. There is a concern for somatoform disorder/untreated anxiety in this patient. As to whether there is a malingering component to this presentation, I am not sure. Pt has history of medication (xanax) seeking behavior. However, is not asking for xanax.  Reiterated with pt importance of medication compliance. Will continue to follow.   Shoulder pain: No pathological findings on exam, with question of somatization. Will, however refer to PT as pt may  have mild strain from exercise. Propranolol switched to metoprolol succinate as pt states that lon acting metoprolol did not cause headaches.   Orthopnea- Smothering feeling likely from untreated anxiety. However, given cardiac family history will obtain ECHO to rule out ANY structural causes. Overall exam reassuring and I feel that BNP would be of little benefit given non-specificity of complaints whereas ECHO would give definitive structural findings to point anxiety as cause of orthopnea.   HTN- Stable. Pt transitioned off of propranolol back to metoprolol and dyazide.

## 2010-07-31 NOTE — Patient Instructions (Signed)

## 2010-08-02 ENCOUNTER — Ambulatory Visit (INDEPENDENT_AMBULATORY_CARE_PROVIDER_SITE_OTHER): Payer: Self-pay | Admitting: *Deleted

## 2010-08-02 DIAGNOSIS — Z111 Encounter for screening for respiratory tuberculosis: Secondary | ICD-10-CM

## 2010-08-02 NOTE — Progress Notes (Signed)
Patient came in today for PPD read.  It was negative.  Letter was provided.

## 2010-08-03 ENCOUNTER — Encounter: Payer: Self-pay | Admitting: Cardiology

## 2010-08-04 ENCOUNTER — Encounter: Payer: Self-pay | Admitting: Cardiology

## 2010-08-04 ENCOUNTER — Ambulatory Visit (INDEPENDENT_AMBULATORY_CARE_PROVIDER_SITE_OTHER): Payer: Self-pay | Admitting: Cardiology

## 2010-08-04 DIAGNOSIS — I10 Essential (primary) hypertension: Secondary | ICD-10-CM

## 2010-08-04 DIAGNOSIS — R079 Chest pain, unspecified: Secondary | ICD-10-CM

## 2010-08-04 NOTE — Patient Instructions (Signed)
Your physician recommends that you schedule a follow-up appointment in: as needed with Dr. Wall  

## 2010-08-04 NOTE — Assessment & Plan Note (Signed)
Resolved. Follow up prn.

## 2010-08-04 NOTE — Progress Notes (Signed)
   Patient ID: Adam [name], male    DOB: December 17, 1965, 45 y.o.   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 He denies any CP but wants to get his BP medications right. He was recently switched to generic Toprol by [facility] and said this caused heaches. He was then switched to Propranolol HCTZ which has his pressure under good control. His headaches have also improved.    Review of Systems  [all other systems reviewed and are negative      Physical Exam  Constitutional: He is oriented to person, place, and time. He appears well-developed and well-nourished.  HENT:  Head: Atraumatic.  Eyes: EOM are normal. Pupils are equal, round, and reactive to light.  Neck: Normal range of motion. Neck supple. No JVD present. No thyromegaly present.  Cardiovascular: Normal rate, regular rhythm, normal heart sounds and intact distal pulses.  Exam reveals no gallop.   No murmur heard. Pulmonary/Chest: Effort normal and breath sounds normal.  Abdominal: Soft.  Musculoskeletal: Normal range of motion. He exhibits no edema.  Neurological: He is alert and oriented to person, place, and time.  Skin: Skin is dry.  Psychiatric:       A and O x 3, easily distracted, hyperactive

## 2010-08-04 NOTE — Assessment & Plan Note (Signed)
Good control, reassurance given that this appropriate therapy. Followup prn.

## 2010-08-08 ENCOUNTER — Ambulatory Visit (HOSPITAL_COMMUNITY)
Admission: RE | Admit: 2010-08-08 | Discharge: 2010-08-08 | Disposition: A | Discharge status: Home / Self Care | Payer: Self-pay | Source: Ambulatory Visit | Attending: Family Medicine | Admitting: Family Medicine

## 2010-08-08 DIAGNOSIS — R079 Chest pain, unspecified: Secondary | ICD-10-CM | POA: Insufficient documentation

## 2010-08-13 LAB — URINALYSIS, ROUTINE W REFLEX MICROSCOPIC
Ketones, ur: NEGATIVE mg/dL
Nitrite: NEGATIVE
Protein, ur: NEGATIVE mg/dL
pH: 6.5 (ref 5.0–8.0)

## 2010-08-13 LAB — POCT I-STAT, CHEM 8
BUN: 14 mg/dL (ref 6–23)
Chloride: 105 mEq/L (ref 96–112)
Sodium: 140 mEq/L (ref 135–145)

## 2010-08-13 LAB — DIFFERENTIAL
Basophils Absolute: 0 10*3/uL (ref 0.0–0.1)
Lymphocytes Relative: 22 % (ref 12–46)
Lymphs Abs: 1.6 10*3/uL (ref 0.7–4.0)
Neutro Abs: 4.9 10*3/uL (ref 1.7–7.7)

## 2010-08-13 LAB — CBC
Hemoglobin: 14.6 g/dL (ref 13.0–17.0)
Platelets: 218 10*3/uL (ref 150–400)
RDW: 12.4 % (ref 11.5–15.5)
WBC: 7.4 10*3/uL (ref 4.0–10.5)

## 2010-08-21 LAB — BRAIN NATRIURETIC PEPTIDE: Pro B Natriuretic peptide (BNP): 62 pg/mL (ref 0.0–100.0)

## 2010-08-22 LAB — DIFFERENTIAL
Eosinophils Absolute: 0.2 10*3/uL (ref 0.0–0.7)
Eosinophils Relative: 4 % (ref 0–5)
Lymphs Abs: 1.2 10*3/uL (ref 0.7–4.0)
Monocytes Absolute: 0.6 10*3/uL (ref 0.1–1.0)
Monocytes Relative: 9 % (ref 3–12)
Neutrophils Relative %: 68 % (ref 43–77)

## 2010-08-22 LAB — CBC
HCT: 45.7 % (ref 39.0–52.0)
Hemoglobin: 15.9 g/dL (ref 13.0–17.0)
MCV: 84.4 fL (ref 78.0–100.0)
RBC: 5.41 MIL/uL (ref 4.22–5.81)
WBC: 6.4 10*3/uL (ref 4.0–10.5)

## 2010-08-22 LAB — POCT CARDIAC MARKERS
Myoglobin, poc: 33.5 ng/mL (ref 12–200)
Myoglobin, poc: 38.9 ng/mL (ref 12–200)

## 2010-08-22 LAB — BASIC METABOLIC PANEL
Chloride: 106 mEq/L (ref 96–112)
GFR calc Af Amer: >60 mL/min (ref >60)
Potassium: 4.2 mEq/L (ref 3.5–5.1)
Sodium: 138 mEq/L (ref 135–145)

## 2010-08-23 ENCOUNTER — Ambulatory Visit (INDEPENDENT_AMBULATORY_CARE_PROVIDER_SITE_OTHER): Payer: Self-pay | Admitting: Family Medicine

## 2010-08-23 ENCOUNTER — Encounter: Payer: Self-pay | Admitting: Family Medicine

## 2010-08-23 DIAGNOSIS — F419 Anxiety disorder, unspecified: Secondary | ICD-10-CM

## 2010-08-23 DIAGNOSIS — F32A Depression, unspecified: Secondary | ICD-10-CM

## 2010-08-23 DIAGNOSIS — IMO0001 Reserved for inherently not codable concepts without codable children: Secondary | ICD-10-CM

## 2010-08-23 DIAGNOSIS — F3289 Other specified depressive episodes: Secondary | ICD-10-CM

## 2010-08-23 DIAGNOSIS — F329 Major depressive disorder, single episode, unspecified: Secondary | ICD-10-CM

## 2010-08-23 DIAGNOSIS — F411 Generalized anxiety disorder: Secondary | ICD-10-CM

## 2010-08-23 DIAGNOSIS — K219 Gastro-esophageal reflux disease without esophagitis: Secondary | ICD-10-CM

## 2010-08-23 MED ORDER — OMEPRAZOLE 40 MG PO CPDR: 40.0000 mg | DELAYED_RELEASE_CAPSULE | Freq: Every day | ORAL | Status: DC

## 2010-08-23 MED ORDER — BUPROPION HCL 75 MG PO TABS: 75.0000 mg | ORAL_TABLET | Freq: Three times a day (TID) | ORAL | Status: DC

## 2010-08-23 NOTE — Patient Instructions (Signed)
Gastroesophageal Reflux Disease (GERD) Your caregiver has diagnosed your chest discomfort as caused by gastroesophageal reflux disease (GERD). GERD is caused by a reflux of acid from your stomach into the digestive tube between your mouth and stomach (esophagus). Acid in contact with the esophagus causes soreness (inflammation) resulting in heartburn or chest pain. It may cause small holes in the lining of the esophagus (ulcers). CAUSES  Increased body weight puts pressure on the stomach, making acid rise.   Smoking increases acid production.   Alcohol decreases pressure on the valve between the stomach and esophagus (lower esophageal sphincter), allowing acid from the stomach into the esophagus.   Late evening meals and a full stomach increase pressure and acid production.   Lower esophageal sphincter is malformed.   Sometimes, no reason is found.  HOME CARE INSTRUCTIONS  Change the factors that you can control. Weight, smoking, or alcohol changes may be difficult to change on your own. Your caregiver can provide guidance and medical therapy.   Raising the head of your bed may help you to sleep.   Over-the-counter medicines will decrease acid production. Your caregiver can also prescribe medicines for this. Only take over-the-counter or prescription medicines for pain, discomfort, or fever as directed by your caregiver.   1/2 to 1 teaspoon of an antacid taken every hour while awake, with meals, and at bedtime, can neutralize acid.   DO NOT take aspirin, ibuprofen, or other nonsteroidal anti-inflammatory drugs (NSAIDs).  SEEK IMMEDIATE MEDICAL CARE IF:  The pain changes in location (radiates into arms, neck, jaw, teeth, or back), intensity, or duration.   You start feeling sick to your stomach (nauseous), start throwing up (vomiting), or sweating (diaphoresis).   You develop left arm or jaw pain.   You develop pain going into your back, shortness of breath, or you pass out.    There is vomiting of fluid that is green, yellow, or looks like coffee grounds or blood.  These symptoms could signal other problems, such as heart disease. MAKE SURE YOU:  Understand these instructions.   Will watch your condition.   Will get help right away if you are not doing well or get worse.  Document Released: 01/31/2005 Document Re-Released: 07/18/2009 [facility] Patient Information 2011 [place], [place].Anxiety and Panic Attacks Your caregiver has informed you that you are having an anxiety or panic attack. There may be many forms of this. Most of the time these attacks come suddenly and without warning. They come at any time of day, including periods of sleep, and at any time of life. They may be strong and unexplained. Although panic attacks are very scary, they are physically harmless. Sometimes the cause of your anxiety is not known. Anxiety is a protective mechanism of the body in its fight or flight mechanism. Most of these perceived danger situations are actually nonphysical situations (such as anxiety over losing a job). CAUSES The causes of an anxiety or panic attack are many. Panic attacks may occur in otherwise healthy people given a certain set of circumstances. There may be a genetic cause for panic attacks. Some medications may also have anxiety as a side effect. SYMPTOMS Some of the most common feelings are:  Intense terror.  Dizziness, feeling faint.   Hot and cold flashes.   Fear of going crazy.   Feelings that nothing is real.   Sweating.   Shaking.   Chest pain or a fast heartbeat (palpitations).  Smothering, choking sensations.   Feelings of impending doom and that  death is near.   Tingling of extremities, this may be from over breathing.   Altered reality (derealization).   Being detached from yourself (depersonalization).   Several symptoms can be present to make up anxiety or panic attacks. DIAGNOSIS The evaluation by your caregiver will  depend on the type of symptoms you are experiencing. The diagnosis of anxiety or pain attack is made when no physical illness can be determined to be a cause of the symptoms. TREATMENT Treatment to prevent anxiety and panic attacks may include:  Avoidance of circumstances that cause anxiety.   Reassurance and relaxation.   Regular exercise.   Relaxation therapies, such as yoga.   Psychotherapy with a psychiatrist or therapist.   Avoidance of caffeine, alcohol and illegal drugs.   Prescribed medication.  SEEK IMMEDIATE MEDICAL CARE IF:  You experience panic attack symptoms that are different than your usual symptoms.   You have any worsening or concerning symptoms.  Document Released: 04/23/2005 Document Re-Released: 10/11/2009 [facility] Patient Information 2011 [place], [place].

## 2010-08-23 NOTE — Progress Notes (Signed)
  Subjective:    Patient ID: Adam [name], male    DOB: [birth year], 45 y.o.   MRN: [id number]  HPI Pt here for follow up on anxiety. Was seen by cardiology for follow up. No major medical changes made. Pt also recently had ECHO in setting of persistent subjective orthopnea. ECHO with no structural or functional abnormalities suggestive of heart failure as cause. Still with nightly feeling of "smothering" relieved with xanax per pt. Otherwise asymptomatic per pt. No HI/SI.  Still refusing to use SSRI/psychotherapy for anxiety  Review of Systems See HPI     Objective:   Physical Exam Gen: up in chair, NAD  HEENT: NCAT, EOMI, TMs clear bilaterally  CV: RRR, no murmurs auscultated  PULM: CTAB, no wheezes, rales, rhoncii  ABD: S/NT/+ bowel sounds  EXT: 2+ peripheral pulses     Assessment & Plan:  Mood- Sxs consistent with uncontrolled anxiety. Longstanding hx/o severe noncompliance.  Negative cardiac workup to date. Assured pt that chest discomfort is not cardiac related. Discussed ECHO findings.  Discussed with pt that "smothering" feeling will continue to persist until anxiety is properly treated. Told pt that prn xanax is not the solution.  Pt still not willing to try SSRI. Encouraged pt to consider.

## 2010-09-02 ENCOUNTER — Emergency Department (HOSPITAL_COMMUNITY): Payer: Self-pay

## 2010-09-02 ENCOUNTER — Emergency Department (HOSPITAL_COMMUNITY)
Admission: EM | Admit: 2010-09-02 | Discharge: 2010-09-02 | Disposition: A | Discharge status: Home / Self Care | Payer: Self-pay | Attending: Emergency Medicine | Admitting: Emergency Medicine

## 2010-09-02 DIAGNOSIS — R0789 Other chest pain: Secondary | ICD-10-CM | POA: Insufficient documentation

## 2010-09-02 DIAGNOSIS — R0609 Other forms of dyspnea: Secondary | ICD-10-CM | POA: Insufficient documentation

## 2010-09-02 DIAGNOSIS — Z79899 Other long term (current) drug therapy: Secondary | ICD-10-CM | POA: Insufficient documentation

## 2010-09-02 DIAGNOSIS — R0989 Other specified symptoms and signs involving the circulatory and respiratory systems: Secondary | ICD-10-CM | POA: Insufficient documentation

## 2010-09-02 DIAGNOSIS — I10 Essential (primary) hypertension: Secondary | ICD-10-CM | POA: Insufficient documentation

## 2010-09-02 LAB — COMPREHENSIVE METABOLIC PANEL
ALT: 23 U/L (ref 0–53)
Alkaline Phosphatase: 70 U/L (ref 39–117)
CO2: 25 mEq/L (ref 19–32)
GFR calc non Af Amer: >60 mL/min (ref >60)
Glucose, Bld: 106 mg/dL — ABNORMAL HIGH (ref 70–99)
Potassium: 3.8 mEq/L (ref 3.5–5.1)
Sodium: 135 mEq/L (ref 135–145)
Total Bilirubin: 0.5 mg/dL (ref 0.3–1.2)

## 2010-09-02 LAB — POCT CARDIAC MARKERS: Myoglobin, poc: 52.8 ng/mL (ref 12–200)

## 2010-09-02 LAB — DIFFERENTIAL
Basophils Relative: 1 % (ref 0–1)
Eosinophils Absolute: 0.4 10*3/uL (ref 0.0–0.7)
Monocytes Absolute: 0.8 10*3/uL (ref 0.1–1.0)
Monocytes Relative: 12 % (ref 3–12)
Neutrophils Relative %: 61 % (ref 43–77)

## 2010-09-02 LAB — CBC
HCT: 40.5 % (ref 39.0–52.0)
MCHC: 35.1 g/dL (ref 30.0–36.0)
MCV: 82 fL (ref 78.0–100.0)
RDW: 12 % (ref 11.5–15.5)

## 2010-09-05 NOTE — Assessment & Plan Note (Addendum)
Sxs consistent with uncontrolled anxiety. Longstanding hx/o severe noncompliance.  Negative cardiac workup to date. Assured pt that chest discomfort is not cardiac related. Discussed ECHO findings.  Discussed with pt that "smothering" feeling will continue to persist until anxiety is properly treated. Told pt that prn xanax is not the solution. Pt still not willing to try SSRI. Encouraged pt to consider. Will otherwise follow up in 204 weeks.

## 2010-09-15 ENCOUNTER — Ambulatory Visit (INDEPENDENT_AMBULATORY_CARE_PROVIDER_SITE_OTHER): Payer: Self-pay | Admitting: Family Medicine

## 2010-09-15 ENCOUNTER — Encounter: Payer: Self-pay | Admitting: Family Medicine

## 2010-09-15 DIAGNOSIS — F411 Generalized anxiety disorder: Secondary | ICD-10-CM

## 2010-09-15 NOTE — Patient Instructions (Signed)
It was good to see you today  Start taking the wellbutrin Continue taking the propranolol Call us if you have any particular questions or concerns Otherwise follow up in 2 months God Bless, Doree Albee MD

## 2010-09-19 NOTE — Assessment & Plan Note (Signed)
[facility]                         GASTROENTEROLOGY OFFICE NOTE   NAME:Adam [name], Adam [name]                   MRN:          [id number]  DATE:05/02/2007                            DOB:          1965-07-11    CHIEF COMPLAINT:  Abdominal pain.   HISTORY:  Mr. Kuennen is a man I know from previous exams.  He has  been having intermittent lower abdominal pain, thought to be prostatitis  or perhaps bladder problems.  He has been in and out of the ER.  He has  had numerous investigations.  He has seen Dr. Brunilda Payor and has been treated  for prostatitis multiple times and had a cystoscopy,  bladder  installation and Marcaine and Pyridium because of a pelvic pain  syndrome.  This was on January 10, 2007. That did not seem to make a  difference for him.  CT abdomen and pelvis with and without contrast  December 23, 2006, unremarkable.  Colonoscopy by me March 2007 negative.   He has had some rectal bleeding at times.  His hemoglobin has been  normal.  He has been in and out of the ER.  He has had loose stools in  the past; he has had constipation.  UA has been negative as well.   He describes intermittent crampy abdominal pain that occurs, also sort  of a steady chronic background of discomfort there as well.  He has not  lost weight; in fact, his weigh has risen since 2006 when I saw him  last.  His appetite is okay.  He does not sleep well.  He has had that  chronically.  Recently given some Ambien in family practice clinic to  see if that would help.   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  His wife is an International aid/development worker at [employer].  He has 9  children; 2 are grown living with his ex-wife, but he has 7 with this  wife ranging in age from 59 to 20 months.  He denies overt depressive  symptoms or suicidal ideation.  He is currently on no particular  medications.   PAST MEDICAL HISTORY:  1. Normal colonoscopy  March 2007.  2. EGD for early satiety problems January 03, 2005.  He also had weight      loss, epigastric pain.  That was normal.  3. Previous history of constipation.  4. Pyloric stenosis treated with surgery as an infant.  5. Hypertension in the past..  6. Anxiety and panic attacks.  7. Depression.  8. Allergic rhinosinusitis.  9. Chronic headaches.  10.Apparently he has had sleep apnea.  11.Prior heart dysrhythmia and negative cardiac catheterization in      2005.  12.Undescended testicle repair as a child.   No alcohol, tobacco, or drugs are used.   Additional history is that he has this pain when he sits at the steering  wheel, also if he lies back flat completely.  He is not complaining of  back pain today.  The pain is not related to eating, bowel movements,  etc.  His bowel habits are fairly regular at this time, though sometimes  loose now and not constipated.  The blood has been only on the tissue  paper lately.  There was one episode where he passed some blood.  He was  evaluated in the ER with labs, etc., with normal hemoglobin at that time  in the summer and more recently in December.  He was seen in the  emergency department on April 22, 2007, with chest pain.  His  hemoglobin was normal at that time.  Potassium was slightly low at 3.2,  but otherwise labs okay.   PHYSICAL EXAMINATION:  GENERAL:  He is in no acute distress.  VITAL SIGNS:  Weight 154 pounds.  Pulse 80, blood pressure 122/86.  ABDOMEN: Soft.  He complains of tenderness, but I am able to palpate  deeply throughout the abdomen without organomegaly or mass.  The groin  is nontender as well.  RECTAL:  Exam reveals brown, heme-negative stool. There are no  protruding hemorrhoids.  There is no mass.  The prostate is slightly  enlarged perhaps.  It appears to be nontender and not boggy on exam.   ASSESSMENT:  This man has chronic pain problems and a gastrointestinal  symptom complex consistent with  irritable bowel syndrome.  He also has  history of anxiety and panic disorder, and significant insomnia would  suggest some underlying anxiety as a contributing factor.  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   PLAN:  1. Start fluoxetine 20 mg daily.  This can be gotten for $4 at [facility].  The patient has significant financial issues, I believe,      with his large family, etc.  He also seems to have some obsessive-      compulsive tendencies when I talk to him.  Hopefully this will      help.  We need to increase the dose.  I will see him back in 6      weeks.  2. He will also be prescribed Levsin SL 1-2 every 4 hours as needed      for pain.  3. He should try to avoid ER visits unless absolutely; I do not think      further testing is in his best interest.  He is somewhat concerned      about the possibility of a missed cancer.  I tried to reassure      him today.  4. Further plans pending this course.     Iva Boop, MD,FACG  Electronically Signed    CEG/MedQ  DD: 05/02/2007  DT: 05/02/2007  Job #: [id number]   cc:   Eustaquio Boyden, MD

## 2010-09-19 NOTE — H&P (Signed)
NAME:  Adam [name], Adam [name]            ACCOUNT NO.:  [id number]   MEDICAL RECORD NO.:  [id number]          PATIENT TYPE:  OBV   LOCATION:  3707                         FACILITY:  [facility]   PHYSICIAN:  Santiago Bumpers. Hensel, M.D.DATE OF BIRTH:  12/09/1965   DATE OF ADMISSION:  08/22/2007  DATE OF DISCHARGE:                              HISTORY & PHYSICAL   PRIMARY CARE PHYSICIAN:  Eustaquio Boyden, M.D., at the  [facility].   CHIEF COMPLAINT:  Chest pain.   HISTORY OF PRESENT ILLNESS:  The patient is a 45 year old Caucasian male  with a history of panic attacks and early family history of MI and  hypertension here today with chest pain.  His chest pain was sharp and  stabbing in the left side of his chest that started at 8:00 p.m. while  walking into [employer] with his wife.  He felt very hot and lightheaded  as well.  No diaphoresis.  He did have some nausea.  He still feels  little nauseous right now, but denies any chest pain currently.  The  episode that lasted a total of 15 minutes.  He was brought to the  hospital via EMS.  He has had similar episodes that has been labeled as  panic attacks, but he has not had a panic attack since May 11, 2007.  This one came on all of a sudden without warning, which is a little bit  different from rest of his other panic attacks.  He was taking Celexa  previously, but stopped this a while back because it made him fatigued.  He is currently under some stress.  He is a father of 9 children, 7 of  them live at home.  They are always fighting.  His mother-in-law also  lives at his house and she is a patient of mine.  She also has major  anxiety issues and I am sure it causes some stress in his life as well.   REVIEW OF SYSTEMS:  Some decreased appetite, only eating fruits  recently.  Abdomen always feels bloated.  No chest pain on exertion.  Actually, he feels better after he exerts himself.  He has had a 20-  pound weight loss  in 5 years.  He does see a GI doctor for his irritable  bowel syndrome.   MEDICATIONS:  1. Tramadol 50 mg 1-2 tablets p.o. q.6 h. p.r.n. pain.  He does not      take this very often.  2. HCTZ 25 mg 1 tablet daily.   ALLERGIES:  PENICILLIN.   PAST MEDICAL HISTORY:  Hiatal hernia, history of pyloric stenosis as a  child, history of remote back pain, undescended testicle repair as a  child, right bundle-branch block on the EKG, gastroesophageal reflux  disease, anxiety with panic attacks, catheterization in 2005 showing an  EF of 55%-60% with small patent vessels.  An EGD that was normal in  August 2006.  Family history of cardiovascular disease in his mother.  Fasting lipid panel done in September 2008 showed total cholesterol of  200, triglycerides of 158, HDL 60, and LDL  of 108.   PAST SURGICAL HISTORY:  He had surgical repair of his pyloric stenosis  as a child, bilateral repair of undescended testicle as a child, right  arm fracture status post ORIF from roller-blading.   FAMILY HISTORY:  Two brothers and two sisters in good health.  Maternal  grandmother died at 83 of Hodgkin's.  Maternal grandfather died in 58s.  Mother is alive, had an MI in her 21s and 3 stents placed, also had CHF,  pyloric stenosis, heart disease, Hodgkin's disease, and anxiety run in  the family, anxiety especially runs in his mother's side.   SOCIAL HISTORY:  He has been a Scientist, water quality.  He has 9 children; 2 from  the first marriage, ages 45 and 47, and 7 from the third marriage.  Currently, he is on his third marriage.  He has 7 children that live  with him and cause him significant stress.  Denies smoking.  He does  have a history of marijuana quite a long time ago.  No alcohol use.   PHYSICAL EXAMINATION:  VITAL SIGNS:  Temperature 97.7, pulse 70, blood  pressure 132/82, 100% on room air.  GENERAL:  Alert, well-developed, well-nourished.  HEENT:  Tongue, moderately dry.  NECK:  Supple.  No masses  and no cervical lymphadenopathy.  LUNGS:  Normal respiratory effort.  Normal breath sounds.  No crackles.  No wheezes.  HEART:  Regular rate and rhythm.  No murmurs, rubs or gallops.  ABDOMEN:  Soft, normoactive bowel sounds, nondistended, no masses.  No  hepatosplenomegaly.  Mild tenderness in the mid epigastric area.  Pulses  2+ radial pulses.  EXTREMITIES:  No edema.  PSYCHIATRIC:  Slightly anxious.   LABORATORY STUDIES:  Cardiac enzymes negative x2, D-dimer less than  0.22.  INR 0.9.  Potassium low at 3.3, hemoglobin 14.6, sodium 137,  chloride 99, glucose 107, BUN 17, creatinine 1.4.   DIAGNOSTIC STUDIES:  Chest X-Ray.  No active cardiopulmonary disease.   EKG:  Normal sinus rhythm, 38 beats per minute, incomplete right bundle-  branch block, T-wave flattening in lead III which is the same as it was  in December 2008, otherwise no ST changes.   ASSESSMENT/PLAN:  The patient is a 45 year old Caucasian male with a  history of panic attacks and anxiety, early family history of mycoardial  infarction and hypertension, here today with chest pain to rule out  myocardial infarction.  1. Chest pain very likely to be myocardial infarction given normal      point-of-care enzymes.  EKG within normal limits and his only risk      factors is hypertension and family history.  Mother was very      overweight at the time when she had her early myocardial      infarction.  The patient also has pretty good LDL of 108 and HDL of      60, which is likely fine for him unless he happens to have coronary      artery disease.  He has a distant history of marijuana use that      could contribute to this, but no real history of tobacco use.  We      will place him in observation to rule out myocardial infarction and      cycle cardiac enzymes q.8 h. x2, EKG in the morning, telemetry, try      Protonix as he has a history of gastroesophageal reflux disease and      hernia to  see if this helps at all.   Since he does have some      midepigastric pain, he will likely need to go home in the morning      if he rules out.  2. Hypertension.  Continue HCTZ at 25 mg.  He is currently well      controlled.  3. Anxiety.  This seems to deteriorated as this is likely the cause of      his chest pain.  He is currently not taking Celexa because of his      fatigue.  He will need to try to try another SSRI and to see if he      tolerates this better and helps him prevent the panic attacks.  He      can follow up with his primary care physician regarding this.   DISPOSITION:  We will likely discharge home in the morning when his  cardiac enzymes are negative when he rules out.      Alanda Amass, M.D.  Electronically Signed      Santiago Bumpers. Leveda Anna, M.D.  Electronically Signed    JH/MEDQ  D:  08/23/2007  T:  08/23/2007  Job:  [id number]

## 2010-09-19 NOTE — Assessment & Plan Note (Signed)
[facility]                            CARDIOLOGY OFFICE NOTE   NAME:Walsh, MYKAEL BATZ                   MRN:          [id number]  DATE:01/29/2008                            DOB:          Nov 23, 1965    Mr. Chadderdon returns today for further management of his palpitations,  hypertension, and dizzy spells.   We will start him on metoprolol succinate 50 mg a day.  His heart rate  is down to 49 today.  He has had couple episodes of dizziness.  One time  he was dizzy, his blood pressure was running in the 150-60 systolic.   He is also on HCTZ 25 mg a day and potassium 20 mEq a day.   A 2-D echocardiogram was obtained this morning without any structural  heart disease or any major cardiomyopathy.  He is overall normal based  on pulmonary reading.  We will get the final report.   PHYSICAL EXAMINATION:  VITAL SIGNS:  Today, his blood pressure is much  better at 120/78, his pulse is 48 and regular, and his weight is 149.  HEART:  Rhythm is regular and slow.  LUNGS:  Clear.  EXTREMITIES:  No edema.  Pulses are intact.   I am delighted Mr. Katt's blood pressure is better.  His  palpitations are also better.  His echo was normal.  Assuming he is  doing well, I will see him back on a p.r.n. basis.     Thomas C. Daleen Squibb, MD, [facility]  Electronically Signed    TCW/MedQ  DD: 01/29/2008  DT: 01/29/2008  Job #: [id number]   cc:   Eustaquio Boyden, MD

## 2010-09-19 NOTE — Op Note (Signed)
NAME:  Adam [name], Adam [name]            ACCOUNT NO.:  [id number]   MEDICAL RECORD NO.:  [id number]          PATIENT TYPE:  AMB   LOCATION:  [facility]                         [facility]ACILITY:  [facility]   PHYSICIAN:  Lindaann Slough, M.D.  DATE OF BIRTH:  1965-05-10   DATE OF PROCEDURE:  01/10/2007  DATE OF DISCHARGE:                               OPERATIVE REPORT   PREOPERATIVE DIAGNOSIS:  Pelvic pain syndrome.   POSTOPERATIVE DIAGNOSIS:  Pelvic pain syndrome.   PROCEDURE:  Cystoscopy and bladder instillation of Marcaine and  Pyridium.   SURGEON:  Dr. Brunilda Payor.   ANESTHESIA:  General.   INDICATIONS:  The patient is a 45 year old male who has been complaining  of pelvic pain.  He was then treated with Septra for chronic  prostatitis; however, he continues to complain of pain.  CT scan is  negative.  He is scheduled today for cystoscopy.   Under general anesthesia, the patient was prepped and draped and placed  in the dorsolithotomy position.  A panendoscope was inserted in the  bladder.  The anterior urethra is normal.  The prostatic urethra is  reddened.  There is no stone or tumor in the bladder.  The ureteral  orifices are in normal position and shape with clear efflux.  There is  no evidence of submucosal hemorrhage.  The bladder was then emptied.  Then, 20 mL of 0.5% Marcaine and 400 mg of Pyridium were instilled in  the bladder.  The cystoscope was then removed.   The patient tolerated the procedure well and left the OR in satisfactory  condition to post-anesthesia care unit.      Lindaann Slough, M.D.  Electronically Signed     MN/MEDQ  D:  01/10/2007  T:  01/10/2007  Job:  [id number]

## 2010-09-19 NOTE — Discharge Summary (Signed)
NAME:  Adam [name], Adam [name]            ACCOUNT NO.:  [id number]   MEDICAL RECORD NO.:  [id number]           PATIENT TYPE:   LOCATION:                                 FACILITY:   PHYSICIAN:  Santiago Bumpers. Hensel, M.D.DATE OF BIRTH:  October 17, 1965   DATE OF ADMISSION:  08/23/2007  DATE OF DISCHARGE:  08/23/2007                               DISCHARGE SUMMARY   PRIMARY CARE PHYSICIAN:  Eustaquio Boyden, MD at the [facility].   DISCHARGE DIAGNOSES:  1. Chest pain likely due to panic attack.  2. Anxiety.  3. Hiatal hernia.  4. Gastroesophageal reflux disease.  5. Irritable bowel syndrome.  6. Hyperlipidemia with an high LDL.  7. Remote history of marijuana use when he was 45 years old.   DISCHARGE MEDICATIONS:  Include:  1. Aspirin 81 mg 1 tablet p.o. daily.  2. HCTZ 25 mg daily.  3. Tramadol 50 mg 1-2 tablets q.6 h. p.r.n. pain.  4. Prilosec 20 mg over the counter daily.  5. Zocor 20 mg 1 tablet p.o. daily for cholesterol.  6. Prozac 20 mg 1 tablet p.o. daily for anxiety/panic attack.   FOLLOWUP APPOINTMENT:  The patient is to return to see Dr. Sharen Hones,  phone number [phone], in 1-2 weeks.  He says he has an appointment with  him at the end, September 04, 2007.  He will keep this appointment.   FOLLOWUP INSTRUCTIONS:  The patient has no restrictions on his activity.  He is to be on a low-sodium heart healthy diet.   FOLLOWUP ISSUES:  The patient was started on Prozac here, this may need  to be titrated up.  This is to treat his anxiety and panic attack.  Also  he was placed on Zocor 20 mg p.o. daily.  Here his cholesterol likely  needed to be checked in a few months to ensure an adequate treatment.  Other followup issues, he has TSH pending on discharge.   LABORATORY DATA:  The patient had cardiac enzymes that were negative x2  as well as two point of care testing cardiac enzymes that were negative  as well.  His triglycerides were 65, HCL 49, total cholesterol  204, LDL  was 142, and TSH is pending.  Potassium on admission was 3.3, hemoglobin  14.6, sodium 137, chloride 99, glucose 107, BUN 17, creatinine 124, INR  0.9, and D-dimer was less than 0.22.  Chest x-ray showed no active  cardiopulmonary disease.  EKG showed normal sinus rhythm of 78 beats per  minute, incomplete right bundle-branch block, and a T-wave flattening in  lead 3, but this was the same as from his EKG of December 2008.  There  were no other ST segment changes.  There was no remarkable changes from  his prior EKG.  CMP on discharge, sodium was 138, potassium varies to  very slightly low at 3.4, chloride 99, glucose 119, BUN 13, and  creatinine 1.15.  LFTs were completely within normal limits.  Calcium  was normal.   HOSPITAL COURSE:  The patient is a 45 year old Caucasian male with a  history of panic attacks  and early family history of MI as well as  hyperlipidemia who came in with chest pain likely due to anxiety/panic  attack.  1. Chest pain.  The patient was ruled for MI with EKG and cardiac      enzymes that were negative for any acute changes on EKG and cardiac      enzymes were normal.  He did have a fasting lipid panel that showed      an elevated LDL from his prior panel last year.  New LDL was 140.      He will be placed on Zocor when he leaves.  We will also start him      on an aspirin daily since he does have hypertension as well as      hyperlipidemia and the family history of MI.  He was also placed on      Protonix here as he does have a history of GERD and a hernia.  He      was placed on telemetry as well and put in 23-hour observation, but      he was discharged the same day, he was admitted.  He was very      stable at the time of discharge.  2. Hyperlipidemia.  The patient will be started on Zocor, he was given      a prescription for 20 mg.  He has to take this at home for his      cholesterol.  3. Hypertension.  We continued him on his HCTZ, he will  continue this      at home as well 25 mg daily.  4. Anxiety/panic attack.  This is most likely the real reason for his      chest pain.  Since Celexa has caused him to have fatigue in the      past, we will start him on Prozac since this is a less sedated      SSRI, and see if he does any better on this, then we will have his      primary titrate this up as needed.   DISPOSITION:  The patient is stable at the time of discharge.  He will  be discharged home because he was ruled out for an MI.  The pain  appeared to be completely noncardiac.      Alanda Amass, M.D.  Electronically Signed      Santiago Bumpers. Leveda Anna, M.D.  Electronically Signed    JH/MEDQ  D:  08/23/2007  T:  08/24/2007  Job:  [id number]

## 2010-09-19 NOTE — Assessment & Plan Note (Signed)
[facility]                            CARDIOLOGY OFFICE NOTE   NAME:Antos, CORDNEY BARSTOW                   MRN:          [id number]  DATE:01/01/2008                            DOB:          [birth year]     I was asked by Dr. Sharen Hones to evaluate Dillard Cannon with  palpitations, dizzy spells without syncope.   HISTORY OF PRESENT ILLNESS:  [name] is a 45 year old married  white male who has a long history of evaluations for chest pain and  panic attacks.  He has been having spontaneous episodes of  lightheadedness.  These were not always associated with palpitations.  He has checked his blood pressure on occasions, and it has been up and  down during these spells.   He has not had true syncope.  He denies any chest pain.  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   He has had previous evaluation in the past including admissions for  chest pain where he ruled out.  A chest x-ray in April showed no active  cardiopulmonary disease.  In addition, he has had a cardiac  catheterization by Dr. Sharyn Lull in 2005, which showed basically normal  coronary arteries.  In addition, he had a stress Myoview in 2003, which  showed no ischemia, EF of 73%.   PAST MEDICAL HISTORY:  He has history of hypertension.   MEDICATIONS:  He has been on HCTZ 25 mg a day.  His potassium runs low  and was low when he was admitted to the hospital in April, at 3.4.  He  is taking over-the-counter potassium.  He also has a history of  hyperlipidemia and is on simvastatin 20 mg a day.  His lipids in April  were total of 204, triglycerides of 65, HDL 49, LDL 142.  His TSH at  that time was normal.  Rest of his blood work was unremarkable except  the potassium.   ALLERGIES:  He is intolerant of PENICILLIN.   SOCIAL HISTORY:  He does not smoke, drink, or use any recreational  products.   SURGICAL HISTORY:  He had stomach surgery for  pyloric stenosis in 1966.  He had a broken left arm repaired in 2000.  He had undescended testicle  that was repaired in 1971 or 1972.   SOCIAL HISTORY:  He is a Scientist, water quality and a Conservation officer, historic buildings.  He is married, has 9 children.  He has had 3 marriages.   REVIEW OF SYSTEMS:  Remarkable for allergies and hay fever.  He has had  constipation, fatigue, hiatal hernia, some urinary tract issues,  anxiety, and depression.  Other review of systems are negative.   FAMILY HISTORY:  He had a moderate heart attack at age 28.  He does not  know about his dad.   PHYSICAL EXAMINATION:  VITAL SIGNS:  Today, his blood pressure is  147/97, his pulse is 77 and regular by EKG but when I examined, he was  running about 100 and regular.  He is 5 feet 6 inches, weighs 145  pounds.  HEENT:  Normocephalic, atraumatic.  PERRLA.  Extraocular movements  intact.  Sclerae are clear.  Facial symmetry is normal.  NECK:  Carotid upstrokes were equal bilaterally without bruits.  No JVD.  Thyroid is not enlarged.  Trachea is midline.  Supple.  There is no  lymphadenopathy.  LUNGS:  Clear to auscultation and percussion.  HEART:  Reveals a nondisplaced PMI.  He has normal S1 and S2.  No  gallop, rub, or murmur.  ABDOMEN:  Soft.  No midline bruit.  No pulsatile mass.  No hepatomegaly.  EXTREMITIES:  No cyanosis, clubbing, or edema.  Pulses are intact.  There is no sign of DVT.  NEURO:  Intact.   EKG shows normal sinus rhythm with no evidence of a right bundle today.  He has had an intermittent right bundle in the past.   ASSESSMENT:  1. Uncontrolled hypertension.  2. Tachypalpitations.  3. Hypokalemia.  4. Hyperlipidemia.   PLAN:  1. Begin potassium supplement 20 mEq a day.  2. Add metoprolol succinate 50 mg a day.  3. Continue HCTZ and simvastatin.  4. A 2-D echocardiogram and followup blood work in about 2-3 weeks.  I      will see him back at that time.     Thomas C. Daleen Squibb, MD, [facility]   Electronically Signed    TCW/MedQ  DD: 01/01/2008  DT: 01/02/2008  Job #: [id number]   cc:   Eustaquio Boyden, MD

## 2010-09-22 ENCOUNTER — Telehealth: Payer: Self-pay | Admitting: *Deleted

## 2010-09-22 NOTE — Progress Notes (Signed)
  Subjective:    Patient ID: [name], male    DOB: Jan 11, 1966, 45 y.o.   MRN: [id number]  HPI Pt here for follow up on mood:  Still not taking wellbutrin. Taking propranolol intermittently. Has been receiving xanax from family members for anxiety. Still with smothering feeling at night; causing trouble with sleep nightly. Pt also recnetly seen in ED for CP. Was diagnosed with panic attack.    Review of Systems See HPI     Objective:   Physical Exam Gen- in chair, NAD CV- RRR, no rubs, gallops, murmurs PULM- CTAB, no wheezes, rales, rhoncii ABD- S/NT/+ bowel sounds EXT-2+ peripheral pulses       Assessment & Plan:  Mood- Longstanding noncompliance. Has had extensive outpatient cardiac work up. Working diagnosis of anxiety induced CP. Pt has never been on SSRI treatment long enough for treatment. Stressed importance of compliance; that CP will be recurring symptom until anxiety is properly treated with long term mood medication. Discussed with pt that xanax is only short term fix for longterm problem. Also addressed with pt inappropriateness of usnig family member's anxiety medication. Pt willing to consider prolong trial of wellbutrin. Will follow up in 2 months.  Pt agreeable to plan.

## 2010-09-22 NOTE — Telephone Encounter (Signed)
Patient has requested a PCP change.  Says "MD only focuses on diagnosing mental issues".  Will route this note to Dr. Alvester Morin to discuss further with the patient.  Dr. Alvester Morin to let me know if I or Dr. Deirdre Priest need to follow up with the patient.

## 2010-09-22 NOTE — Assessment & Plan Note (Addendum)
Longstanding noncompliance. Has had extensive outpatient cardiac work up. Working diagnosis of anxiety induced CP. Pt has never been on SSRI treatment long enough for treatment. Stressed importance of compliance; that CP will be recurring symptom until anxiety is properly treated with long term mood medication. Discussed with pt that xanax is only short term fix for longterm problem. Also addressed with pt inappropriateness of usnig family member's anxiety medication. Pt willing to consider prolong trial of wellbutrin. Will follow up in 2 months. Pt agreeable to plan.

## 2010-09-22 NOTE — Cardiovascular Report (Signed)
NAME:  Adam [name], Adam [name]                      ACCOUNT NO.:  [id number]   MEDICAL RECORD NO.:  [id number]                   PATIENT TYPE:  OIB   LOCATION:  6501                                 FACILITY:  [facility]   PHYSICIAN:  Mohan N. Sharyn Lull, M.D.              DATE OF BIRTH:  1965-06-26   DATE OF PROCEDURE:  06/15/2003  DATE OF DISCHARGE:  06/15/2003                              CARDIAC CATHETERIZATION   PROCEDURE:  Left cardiac catheterization with selective left and right  coronary angiography and left ventriculography via the right groin using  Judkins technique.   INDICATIONS:  Adam [name] is a 45 year old white male with past medical  history significant for borderline hypertension, gastroesophageal reflux  disease, history of panic attack, positive family history of coronary artery  disease who complains of retrosternal chest tightness associated with nausea  lasting for few minutes.  Relieved with rest.  Also, complains of exertional  chest pain while walking up steps associated with shortness of breath and  feeling dizzy.  Denies any palpitation or syncope.  Denies PND, orthopnea,  leg swelling.  Also, complains of claudication pain in the legs off and on.  The patient had stress Cardiolite in October 2003 which was positive by EKG  criteria, but showed no evidence of reversible ischemia and was treated  medically.  Due to recent new onset of anginal chest pain, discussed with  patient about his options of treatment; i.e., noninvasive repeat stress  testing versus left cath its risks and benefits and consented for invasive  procedure.   PAST MEDICAL HISTORY:  As above.   PAST SURGICAL HISTORY:  Pyloric stenosis repair at the age of [age] weeks.  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   ALLERGIES:  PENICILLIN.   MEDICATIONS:  1. Baby aspirin 81 mg p.o. daily.  2. Nexium 40 mg p.o. daily.  3. Nitrostat sublingual p.r.n.   SOCIAL HISTORY:  He is married and  has eight children.  No history of  smoking or alcohol use.  No history of cocaine abuse.  Born in [place].  Lives in [place].   FAMILY HISTORY:  Mother is alive.  She had myocardial infarction in her 81s  and subsequently percutaneous transluminal coronary angioplasty and three  stents recently.  Grandmother had myocardial infarction in old age.  He has  two brothers and two sisters in good health.   PHYSICAL EXAMINATION:  GENERAL:  He was alert, awake, oriented x3 in no  acute distress.  VITAL SIGNS:  Blood pressure 130/80, pulse 74 and regular.  HEENT:  Conjunctivae pink.  NECK:  Supple.  No jugular venous distention.  No bruit.  LUNGS:  Clear to auscultation without rhonchi or rales.  CARDIOVASCULAR:  S1, S2 was normal.  There was no S3 gallop or rub or  murmur.  ABDOMEN:  Soft.  Bowel sounds are present.  Nontender.  EXTREMITIES:  No clubbing, cyanosis or edema.  EKG showed normal sinus rhythm and septal Q waves and intraventricular  conduction delay.   IMPRESSION:  1. New onset angina.  2. Gastroesophageal reflux disease.  3. History of panic attacks.  4. Positive family history of coronary artery disease.   Discussed with patient regarding various options of treatment; i.e.,  noninvasive stress testing versus invasive left cath; its risks, i.e.,  death, myocardial infarction, stroke, local vascular complications, etc.,  and consented for the procedure.   PROCEDURE:  After obtaining informed consent, the patient was brought to the  cath lab and was placed on fluoroscopy table. Right groin was prepped and  draped in usual fashion.  2% Xylocaine was used for local anesthesia in the  right groin.  With the help of thin-wall needle, a 4-French arterial sheath  was placed.  The sheath was aspirated and flushed.  Next, 4-French left  Judkins catheter was advanced over the wire under fluoroscopic guidance up  to the ascending aorta.  Wire was pulled out and the catheter  was aspirated  and connected to the manifold. Catheter was further advanced and engaged  into left coronary ostium.  Multiple views of the left system were taken.  Next, the catheter was disengaged and was pulled out over the wire and was  replaced with 4-French right Judkins catheter which was advanced over the  wire under fluoroscopic guidance to the ascending aorta.  Wire was pulled  out, the catheter was aspirated and connected to the manifold.  Catheter was  further advanced and attempted to engage into right coronary ostium without  success.  Next, 4-French Judkins catheter was pulled out over the wire and  was replaced with 4-French __________catheter which was advanced over the  wire under fluoroscopic guidance up to the ascending aorta.  Wire was pulled  out, the catheter was aspirated and connected to the manifold.  The catheter  was further advanced into the right coronary ostium.  Multiple views of the  right coronary artery were obtained.  Next, the catheter was disengaged and  was pulled out over the wire and was replaced with 4-French pigtail catheter  which was advanced over the wire under fluoroscopic guidance up to the  ascending aorta.  Wire was pulled out, the catheter was aspirated and  connected to the manifold.  The catheter was further advanced across the  aortic valve into the LV.  LV pressures were recorded.  Next, left  ventriculography was done in 30-degree RAO position.  Post angiographic  pressures were recorded from LV and then pullback pressures were recorded  from the aorta.  There was no gradient across the aortic valve.  Next, the  pigtail catheter was pulled out over the wire, sheaths aspirated and  flushed.   FINDINGS:  The LV showed good LV systolic function, EF of 55-60%.  Left main  was patent.  LAD was patent.  Diagonal one and two were moderate size which were patent.  Left circumflex was patent.  OM-1 to OM-3 were very, very  small which were  patent.  OM-4 was moderate size which bifurcates into  superior and inferior branch which were patent.  RCA was patent.  The  patient tolerated the procedure well.  There were no complications.  The  patient was transferred to recovery room in stable condition.  Eduardo Osier. Sharyn Lull, M.D.    MNH/MEDQ  D:  06/15/2003  T:  06/15/2003  Job:  [id number]

## 2010-09-22 NOTE — Discharge Summary (Signed)
[place]  Patient:    BHAVYA, GRAND                   MRN: [id number] Adm. Date:  [id number] Disc. Date: 07/17/99 Attending:  Garnette Scheuermann Dictator:   Clance Boll, M.D. CC:         [facility] [place] (please fax)                           Discharge Summary  DATE OF BIRTH:  November 13, 1965  DISCHARGE DIAGNOSIS:  Panic disorder.  PAST MEDICAL HISTORY: 1. Chest pain since 1991. 2. 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 3. Cryptorchism surgically repaired at 45 years old.  DISCHARGE MEDICATIONS: 1. Xanax 0.25 mg t.i.d. 2. Pepcid 20 mg q.h.s.  HISTORY OF PRESENT ILLNESS:  Please see inpatient history and physical for full  discussion of the patients past medical history, social history, family history. The patient is a 45 year old white male who presented with substernal chest pain that radiated to his neck with associated nausea and chills.  The pain occurs two to three times a day until recently, in the last two weeks, with decrease in caffeine intake it now occurs one to two times a day, some days with no pain. he patient reports that there is a pressure-like pain associated with shortness of  breath and filling like he cannot get enough air.  Sometimes he has numbness in his left arm.  The patient has no chest pain currently, and the patient has never been on any medications.  HOSPITAL COURSE:  In the hospital, the patient has been asymptomatic.  The patient has had normal laboratories.  The patients electrolytes and CBC were within normal limits.  The patients CK was 106, MB was 1.5, and troponin I was less than 0.03. The patients INR was 1.2.  The patient had a right bundle branch block on an EKG, which was otherwise normal.  The patient had a normal stress test on the morning of July 17, 1999.  No ST changes, no other abnormalities noted.  The patient reached a good level of cardiac fitness.  The patient was  discharged on Xanax and Pepcid, with instructions to follow up with [facility] for an already-scheduled appointment on Wednesday, July 19, 1999.  ACTIVITY:  No restrictions.  DIET:  The only restriction placed is to avoid all caffeine.  FOLLOW-UP:  The patient is to follow up with [facility] on Wednesday, at which point he is to discuss with [facility] his intent to decrease his Xanax over time, to the point where he will no longer need to be taking any once he has been able to manage his panic with behavioral modification.  The patient also needs o discuss with [facility] the possibility of a barium swallow in the future just to evaluate the current status of his surgical correction of his pyloric stenosis, to evaluate the possibility of some kind of residual scarring that has increased his likelihood for GERD or some other reflux symptomatology. DD:  07/17/99 TD:  07/17/99 Job: [id number]

## 2010-09-22 NOTE — Discharge Summary (Signed)
NAME:  Adam [name], Adam [name]                      ACCOUNT NO.:  [id number]   MEDICAL RECORD NO.:  [id number]                   PATIENT TYPE:  INP   LOCATION:  6526                                 FACILITY:  [facility]   PHYSICIAN:  Mohan N. Sharyn Lull, M.D.              DATE OF BIRTH:  1966/02/20   DATE OF ADMISSION:  03/05/2002  DATE OF DISCHARGE:  03/06/2002                                 DISCHARGE SUMMARY   ADMISSION DIAGNOSES:  1. Chest pain, rule out pulmonary disease.  2. History of panic attacks.  3. Rule out gastroesophageal reflux disease.  4. Positive family history of coronary artery disease.   DISCHARGE DIAGNOSES:  1. Chest pain, negative stress Cardiolite.  2. Rule out gastroesophageal reflux disease.  3. History of panic attacks.  4. Positive family history of coronary artery disease.  5. Elevated blood pressure, rule out hypertension.   DISCHARGE MEDICATIONS:  1. Nexium 40 mg one capsule daily, half hour before breakfast.  2. Baby aspirin 81 mg one tablet daily.  3. Nitrostat 0.4 mg, use as directed.   ACTIVITY:  As tolerated.   DIET:  Low salt, low cholesterol.   FOLLOW UP:  Follow up with me in two weeks.   CONDITION ON DISCHARGE:  Stable.   BRIEF HISTORY/HOSPITAL COURSE:  The patient is a 45 year old white male with  past medical history significant for panic attack, also family history of  coronary artery disease.  He came to the ER via EMS complaining of  retrosternal squeezing, chest pain associated with nausea.  Pain was  localized, 5/10, lasting a few minutes, and again, he had two to three  episodes of chest pain, so decided to call EMS.  He denies any exertional  chest pain, denies vomiting but felt mild shortness of breath.  Denies  palpitations, light-headedness, nor syncope.  He states he had episodes  chest pain approximately two years ago and had stress test which was normal.  He denies any PND or orthopnea, leg swelling.  Denies admission of  chest  pain with breathing or movement.  Denies any fever or chills, cough.  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   PAST MEDICAL HISTORY:  As above.   PAST SURGICAL HISTORY:  1. He had pyloric stenosis repair at the age of [age] weeks.  2. He had undescended testicle for which he had surgery at the age of [age] .  3. Left forearm fracture approximately four years ago.   ALLERGIES:  He is allergic to PENICILLIN.   MEDICATIONS:  At home he takes aspirin on a p.r.n. basis.   SOCIAL HISTORY:  He is divorced, has eight children.  No history of smoking,  or alcohol abuse, no history of cocaine abuse.  He was born in [place].  Lives in [place] with family.   FAMILY HISTORY:  Mother had MI  at the age of 26.  We do not know about his  father.  He has two brothers and one sister in good health.  Grandfather had  MI in old age.   PHYSICAL EXAMINATION:  GENERAL:  He was alert, awake, oriented x 3 in no  acute distress.  He was anxious.  VITAL SIGNS:  Blood pressure was 148/86, pulse was 108.  Sinus tachycardia  on the monitor.  NECK:  Supple.  No JVD and no bruit.  LUNGS:  Clear to auscultation without rhonchi or rales.  CARDIOVASCULAR:  S1 and S2 was normal.  There was no S3 gallop.  ABDOMEN:  Soft.  Bowel sounds are present and nontender.  EXTREMITIES:  There is no clubbing, cyanosis, or edema.   LABORATORY DATA:  EKG showed normal sinus rhythm, left atrial enlargement.  RSR.  No ischemic changes are noted.   Two sets of cardiac enzymes were negative.  His hemoglobin was 15.2,  hematocrit 45.4, white count of 6.4, platelet count of 264,000.  His  potassium was 3.8, glucose 105, BUN 14, creatinine 1.0.  Two sets of CPK-MBs  were negative.  Troponin I, three sets, were negative.  Cholesterol was 152,  LDL of 68, HDL of 44.  Stress Cardiolite was negative for ischemia with  perfusion defect, EF of 72%.   Chest x-ray showed no  evidence of acute pulmonary disease.   HOSPITAL COURSE:  The patient was admitted  to rule out myocardial  infarction, rule out by serial enzymes on EKG.  The patient Cardiolite today  on exercise 14.0 q.m. on Bruce protocol.  The heart rate achieved was 57  beats.  The blood pressure was 157/75.   EKG at baseline showed normal sinus rhythm slightly upsloping as the patient  was elevated laterally.  Horizontal asystole was noted in inferior leads at  peak of exercise which immediately became upsloping and subsequently atrial  fibrillation became horizontal in downsloping and it returned to its  baseline.  Nothing else was noted.  Cardiolite scan showed no evidence of  reversible ischemia.  EF of 72%.  The patient did not have any episodes of  chest pain during the hospital stay.  The patient has been ambulating well  without any problems.   The patient will be discharged home on the above medications and will be  followed up in my office in two weeks.  The patient has been advised to call  my office immediately if he gets recurrent chest pain or is using frequent  nitroglycerin.                                                   Eduardo Osier. Sharyn Lull, M.D.    MNH/MEDQ  D:  03/06/2002  T:  03/08/2002  Job:  [id number]   cc:   Eduardo Osier. Sharyn Lull, M.D.  [address]  [place]  [place] [address]  Fax: [phone]

## 2010-09-25 NOTE — Telephone Encounter (Signed)
Called pt back and left VM for pt. Told pt I was calling back to address any particular concerns about medical care thus far. Left message to call back if there was anything that I could help address. Told pt to otherwise call back if there were any other particular concerns or requests.

## 2010-10-05 ENCOUNTER — Ambulatory Visit (INDEPENDENT_AMBULATORY_CARE_PROVIDER_SITE_OTHER): Payer: Self-pay | Admitting: Family Medicine

## 2010-10-05 ENCOUNTER — Telehealth: Payer: Self-pay | Admitting: Family Medicine

## 2010-10-05 VITALS — BP 137/74 | HR 60 | Temp 98.1°F | Wt 151.0 lb

## 2010-10-05 DIAGNOSIS — N419 Inflammatory disease of prostate, unspecified: Secondary | ICD-10-CM

## 2010-10-05 DIAGNOSIS — K589 Irritable bowel syndrome without diarrhea: Secondary | ICD-10-CM

## 2010-10-05 MED ORDER — CIPROFLOXACIN HCL 500 MG PO TABS: 500.0000 mg | ORAL_TABLET | Freq: Two times a day (BID) | ORAL | Status: AC

## 2010-10-05 NOTE — Patient Instructions (Signed)
Nice to meet you We will treat you for prostatitis.  Take cipro two times daily for the next 10 days Follow up with your doctor in then next 2-3 weeks to make sure you are doing better.

## 2010-10-05 NOTE — Progress Notes (Signed)
  Subjective:     Adam [name] is a 45 y.o. male who presents for evaluation of abdominal cramping, abdominal pain and irritable bowel syndrome. Onset of symptoms was several days ago. Current symptoms: crampy abdominal pain: moderate: location: in the RLQ and in the LLQ and abdominal bloating: moderate. Patient denies belching, flatus and hematochezia. Symptoms have progressed to a point and plateaued. Previous visits for this problem: pt states has diagnosis but none in chart. Evaluation to date has been stool cultures: negative. Treatment to date has been none.  The following portions of the patient's history were reviewed and updated as appropriate: allergies, current medications, past family history, past medical history, past social history, past surgical history and problem list.  Review of Systems A comprehensive review of systems was negative.   Objective:    BP 137/74  Pulse 60  Temp(Src) 98.1 F (36.7 C) (Oral)  Wt 151 lb (68.493 kg) General appearance: alert, cooperative and appears stated age Throat: lips, mucosa, and tongue normal; teeth and gums normal Lungs: clear to auscultation bilaterally Heart: regular rate and rhythm, S1, S2 normal, no murmur, click, rub or gallop Abdomen: normal findings: aorta normal, bowel sounds normal, liver span normal to percussion, no bruits heard, no masses palpable and no organomegaly and abnormal findings:  hyperactive bowel sounds and mild tenderness in the LLQ Male genitalia: normal, penis: no lesions or discharge. testes: no masses or tenderness. no hernias Rectal: soft brown guaiac negative stool noted and the prostate is on normal consistency without nodules and the prostate is boggy and tender in the mid   Assessment:    Irritable bowel syndrome which has stabilized.   Prostatitis Plan:    Discussed the management and usual course of IBS. Reassured that even severe IBS pain is not indicative of life-threatening  problems. Transport planner distributed. Discussed dietary advice. Discussed fiber supplementation. Follow up in 2 weeks or as needed.  On physical exam tender prostate will treat with cipro will see if improve follow up after treatment.

## 2010-10-05 NOTE — Telephone Encounter (Signed)
Returned call.  No answer.  Left message to call us back 

## 2010-10-05 NOTE — Telephone Encounter (Signed)
Patient would like to talk to you about switching doctors.  Please call him.

## 2010-10-05 NOTE — Assessment & Plan Note (Addendum)
Will treat, has hx of it will treat for 10 days.  If continue to get recurrent then will culture and identify organism.

## 2010-10-05 NOTE — Assessment & Plan Note (Signed)
Will put on diagnosis may be related to the prostatitis again will monitor symptoms given handout. F/u 2 weeks

## 2010-10-06 NOTE — Telephone Encounter (Signed)
Mr. Manthey called back regarding the message.  Will be waiting for another return call.

## 2010-10-10 NOTE — Telephone Encounter (Signed)
Attempted to call him again.  Got his VM.  Left message to call me back.

## 2010-10-18 ENCOUNTER — Telehealth: Payer: Self-pay | Admitting: *Deleted

## 2010-10-18 NOTE — Telephone Encounter (Signed)
Patient is requesting a PCP change.  When asked why states that Dr. Alvester Morin "only focuses on his mood" during the OVs.   Says that any physical complaints that he has are always attributed to "his mood" and then he tells him that he needs to see a psychiatrist.  Asked him if Dr. Alvester Morin actually examines him during these visits or does he just talk about "the mood" issue.  He acknowledged that he does complete a physical exam but doesn't understand why his physical problems are not taken care of.  He describes his biggest problems as stomach issues and shoulder pain.  I told him that there would be a group (3 people) meeting to discuss his request and once a decision is made we would call him back.     Patient appreciative.

## 2010-10-19 ENCOUNTER — Encounter: Payer: Self-pay | Admitting: Internal Medicine

## 2010-10-19 NOTE — Telephone Encounter (Signed)
Pt is asking about the decision to change doctors.

## 2010-10-20 ENCOUNTER — Ambulatory Visit (INDEPENDENT_AMBULATORY_CARE_PROVIDER_SITE_OTHER): Payer: Self-pay | Admitting: Sports Medicine

## 2010-10-20 ENCOUNTER — Encounter: Payer: Self-pay | Admitting: Sports Medicine

## 2010-10-20 ENCOUNTER — Ambulatory Visit (HOSPITAL_COMMUNITY)
Admission: RE | Admit: 2010-10-20 | Discharge: 2010-10-20 | Disposition: A | Discharge status: Home / Self Care | Payer: Self-pay | Source: Ambulatory Visit | Attending: Family Medicine | Admitting: Family Medicine

## 2010-10-20 DIAGNOSIS — R1032 Left lower quadrant pain: Secondary | ICD-10-CM | POA: Insufficient documentation

## 2010-10-20 DIAGNOSIS — R109 Unspecified abdominal pain: Secondary | ICD-10-CM

## 2010-10-20 LAB — CBC WITH DIFFERENTIAL/PLATELET
Basophils Absolute: 0 10*3/uL (ref 0.0–0.1)
Basophils Relative: 1 % (ref 0–1)
Eosinophils Relative: 5 % (ref 0–5)
HCT: 44.6 % (ref 39.0–52.0)
MCHC: 34.3 g/dL (ref 30.0–36.0)
MCV: 82.9 fL (ref 78.0–100.0)
Monocytes Absolute: 0.5 10*3/uL (ref 0.1–1.0)
RDW: 12.7 % (ref 11.5–15.5)

## 2010-10-20 MED ORDER — PSYLLIUM 100 % PO PACK: 1.0000 | PACK | Freq: Three times a day (TID) | ORAL | Status: DC

## 2010-10-20 NOTE — Progress Notes (Signed)
  Subjective:    Patient ID: Adam [name], male    DOB: 09-17-1965, 45 y.o.   MRN: [id number]  HPI 45 yo male, previously dx and tx prostatitis w/ cipro for 10d, and IBS, here with abd pain.  Notes pain no better after tx with cipro.  Pain is mostly LLQ, after eating, associated with stool and urinary urgency.  Notes mucus and streaks blood on the toilet paper.  Stools approx q2d.  Stools are "pencil caliber."  He had a colonosopy 10 yrs ago that was negative that was done for "abd pain."  He was advised to increase fiber at the last visit which he has not done.   No fevers/chills/wt loss, N/V/D, rashes.  Stooling does NOT relieve his pain.   Has appt with Dr. Leone Payor with GI on July 15.    Review of Systems    See HPI Objective:   Physical Exam  Constitutional: He appears well-developed and well-nourished.  Abdominal: Soft. Bowel sounds are normal. He exhibits no distension and no mass. There is tenderness. There is no rebound and no guarding.       TTP LLQ and RLQ.  Skin: Skin is warm and dry.          Assessment & Plan:

## 2010-10-20 NOTE — Assessment & Plan Note (Signed)
Initial suspicion prostatitis however no improvement after 10d cipro. With pencil stools now and mucous/blood on wiping need to assess for neoplasm. Pt now has GI appt with Dr. Leone Payor in 1 month. Will check CBC, KUB. IBS is also a possibility so will add psyllium TID to regimen. RTC after he sees GI to reassess.

## 2010-10-20 NOTE — Patient Instructions (Signed)
You will need to take psyllium 3x a day with meals. Checking some bloodwork. Get Xray of your abdomen. Be sure to keep your appt with Dr. Leone Payor and come back to see [place] after you see him.   Adam [name]. Adam [name], M.D. [facility] [address] [place] [address] [phone]

## 2010-10-23 ENCOUNTER — Telehealth: Payer: Self-pay | Admitting: Family Medicine

## 2010-10-23 DIAGNOSIS — I10 Essential (primary) hypertension: Secondary | ICD-10-CM

## 2010-10-23 MED ORDER — METOPROLOL SUCCINATE ER 50 MG PO TB24: 25.0000 mg | ORAL_TABLET | Freq: Every day | ORAL | Status: DC

## 2010-10-23 NOTE — Telephone Encounter (Signed)
We will meet and let him know but will take to the end of the week

## 2010-10-23 NOTE — Telephone Encounter (Signed)
Called to follow up with pt as I received request for refill on toprol XL 50 1/2 tab daily. 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 Pt seen by myself on 3/26 and changed back to metoprolol from propranolol at pt's request.  Metoprolol  was discontinued by Dr. Daleen Squibb 08/04/10, agreeing with propranolol for management of HTN and HAs. Last refill was 04/20/2010 for toprol XL per refill authorization request. Pt states that he has not been taking propranolol for at least the last 6 weeks. Pt also stated that he has several medications that have not been filled since 07/2010. Pt has been taking taking toprol XL 50 mg 1/2 tab daily for last month. Pt does remember recommendation of Dr. Daleen Squibb on 3/30 to continue with propranolol. Pt stated "that doesn't mean that I agree with it". Pt states that metoprolol makes his body feel better. Told pt that I will refill toprol, but to contact Dr. Vern Claude office for any additional HTN medication refills as top decrease confusion. Pt agreeable.

## 2010-10-31 ENCOUNTER — Ambulatory Visit: Payer: Self-pay | Admitting: Family Medicine

## 2010-11-07 ENCOUNTER — Inpatient Hospital Stay (INDEPENDENT_AMBULATORY_CARE_PROVIDER_SITE_OTHER)
Admission: RE | Admit: 2010-11-07 | Discharge: 2010-11-07 | Disposition: A | Discharge status: Home / Self Care | Payer: Self-pay | Source: Ambulatory Visit

## 2010-11-07 DIAGNOSIS — M25519 Pain in unspecified shoulder: Secondary | ICD-10-CM

## 2010-11-23 ENCOUNTER — Ambulatory Visit: Payer: Self-pay | Admitting: Internal Medicine

## 2010-12-13 ENCOUNTER — Encounter: Payer: Self-pay | Admitting: Family Medicine

## 2010-12-13 ENCOUNTER — Ambulatory Visit (INDEPENDENT_AMBULATORY_CARE_PROVIDER_SITE_OTHER): Payer: Self-pay | Admitting: Family Medicine

## 2010-12-13 VITALS — BP 148/87 | HR 66 | Temp 98.3°F | Wt 157.0 lb

## 2010-12-13 DIAGNOSIS — F411 Generalized anxiety disorder: Secondary | ICD-10-CM

## 2010-12-13 DIAGNOSIS — R5381 Other malaise: Secondary | ICD-10-CM

## 2010-12-13 DIAGNOSIS — R5383 Other fatigue: Secondary | ICD-10-CM | POA: Insufficient documentation

## 2010-12-13 MED ORDER — HYDROXYZINE HCL 10 MG PO TABS: 10.0000 mg | ORAL_TABLET | Freq: Three times a day (TID) | ORAL | Status: AC | PRN

## 2010-12-13 NOTE — Progress Notes (Signed)
  Subjective:    Patient ID: Adam [name], male    DOB: [birth year], 45 y.o.   MRN: [id number]  HPI Dizziness-  Pt states been having some lightheadedness while driving mostly but can occur at anytime.  Pt states he has had this problem for quite awhile but now seems to be more frequent.  Pt states it seems to occur when he has more time to think about stuff that is stressing him out.  No LOC, no nausea no vomiting, has had chest discomfort before but then he thinks that is more due to anxiety attacks.  Pt denies any new medications and only medication he is taking is the metoprolol. Pt has had a large workup for this problem including imaging and evaluation with cardiology with no etiology found.  Pt is frustrated with the healthcare he has received because we cannot find an answer.   Pt was diagnosed with anxiety and was started on wellbutrin but never took it due to the side effects he read about it. Pt denies any new associated symptoms such asfever, chills, nausea vomiting abdominal pain, dysuria, chest pain, shortness of breath dyspnea on exertion or numbness in extremities. Pt does state he has a lot of stress with 7 kids and trying to make ends meet.      Review of Systems See above Past medical history, social, surgical and family history all reviewed.      Objective:   Physical Exam  Constitutional: He is oriented to person, place, and time. He appears well-developed and well-nourished.  Eyes: EOM are normal. Pupils are equal, round, and reactive to light. No scleral icterus.  Neck: Normal range of motion. Neck supple. No tracheal deviation present. No thyromegaly present.  Cardiovascular: Normal rate and regular rhythm.   No murmur heard. Pulmonary/Chest: Effort normal and breath sounds normal.  Abdominal: Soft. Bowel sounds are normal. He exhibits no distension. There is no tenderness.  Musculoskeletal: Normal range of motion.  Lymphadenopathy:    He has no cervical  adenopathy.  Neurological: He is alert and oriented to person, place, and time. He has normal reflexes. No cranial nerve deficit.  Skin: Skin is warm and dry.  Psychiatric:       Pt does appear very anxious.           Assessment & Plan:  Fatigue Concern maybe thyroid giving [place] some problem will check.   ANXIETY Discussed with pt at length that likely underlying condition but will get TSH labs to rule out any organic causes.  Will give pt some hydroxyzine to try to help and see how pt does. Will have pt follow up in 2 weeks to see if pt would like greater treatment.  Would consider Lexapro, celexa or Effexor to help pt.      Dizziness-  Likely secondary to anxiety workup has been full.  Will monitor at this time.

## 2010-12-13 NOTE — Assessment & Plan Note (Signed)
Concern maybe thyroid giving [place] some problem will check.

## 2010-12-13 NOTE — Patient Instructions (Signed)
Good to see you again I am going to get some labs on you today checking your thyroid and I will call you with the results I also want to give you a medicine for you when you feel overwhelmed which should help You can take 1 pill three times a day if you need it.  I do think you have a generalized anxiety disorder not being treated properly and when you feel comfortable I will treat you but lets check the labs first.

## 2010-12-13 NOTE — Assessment & Plan Note (Signed)
Discussed with pt at length that likely underlying condition but will get TSH labs to rule out any organic causes.  Will give pt some hydroxyzine to try to help and see how pt does. Will have pt follow up in 2 weeks to see if pt would like greater treatment.  Would consider Lexapro, celexa or Effexor to help pt.

## 2010-12-27 ENCOUNTER — Telehealth: Payer: Self-pay | Admitting: Family Medicine

## 2010-12-27 NOTE — Telephone Encounter (Signed)
Called to follow up with pt on recent request on metoprolol. Reminded pt of recent discussion we had about sending metoprolol refill to cardiologist as pt has had multiple BP medication changes. Pt states that he has not been to cardiologist and is very upset about having to go to cardiologist about refill. Pt states that he wants to file a formal complaint about this. Told pt that he free to do this. Told pt that he can change PCPs as he has previously desired this. Pt agreeable.

## 2010-12-28 ENCOUNTER — Telehealth: Payer: Self-pay | Admitting: Family Medicine

## 2010-12-28 ENCOUNTER — Other Ambulatory Visit: Payer: Self-pay | Admitting: Family Medicine

## 2010-12-28 DIAGNOSIS — I10 Essential (primary) hypertension: Secondary | ICD-10-CM

## 2010-12-28 MED ORDER — METOPROLOL SUCCINATE ER 50 MG PO TB24: 25.0000 mg | ORAL_TABLET | Freq: Every day | ORAL | Status: DC

## 2010-12-28 NOTE — Telephone Encounter (Signed)
Wants to speak with the Assistance Director about who his PCP is.  He has spoken to someone before but it hasn't been taken care of.  He says that Dr. Alvester Morin refuses to prescribe his BP medications and he needs them.

## 2010-12-28 NOTE — Telephone Encounter (Signed)
Spoke with Dr. Alvester Morin and he advises that he will refill for one month and patient needs to contact his cardiologist for additional refills on metoprolol. Patient notified and advised that this message will be sent to Dennison Nancy RN Assistant Director, she is not available this week but will be back on 01/01/2011.

## 2011-01-01 NOTE — Telephone Encounter (Signed)
Discussed with Mr Kresse.   He feels that Dr Alvester Morin believes many of his complaints are due to mental health issues and Mr Rito Ehrlich not agree.   He would like a doctor who does not focus so much on theses issues.  I explained that we allow one change of PCPs and that we are all trained to consider mental health issues as a cause or contributor to many conditions and that I would think it likely that a new PCP might also want to address these issues.

## 2011-01-01 NOTE — Telephone Encounter (Signed)
Called left message for him to call us with times for me to talk with him

## 2011-01-01 NOTE — Telephone Encounter (Signed)
We need to talk about this guy.  I just finished meeting with Viviann Spare about him.  Do you have any time today?

## 2011-01-01 NOTE — Telephone Encounter (Signed)
Mr. Saiki has called to cancel his appt for tomorrow due to not having his PCP changed yet.  He wanted to also let Dennison Nancy, RN know that he is waiting to hear back from her pertaining to the change before he calls to reschedule.

## 2011-01-01 NOTE — Telephone Encounter (Signed)
Will discuss this with Dr. Deirdre Priest first.

## 2011-01-02 ENCOUNTER — Ambulatory Visit: Payer: Self-pay | Admitting: Family Medicine

## 2011-01-04 ENCOUNTER — Encounter: Payer: Self-pay | Admitting: Family Medicine

## 2011-01-04 ENCOUNTER — Ambulatory Visit (INDEPENDENT_AMBULATORY_CARE_PROVIDER_SITE_OTHER): Payer: Self-pay | Admitting: Family Medicine

## 2011-01-04 VITALS — BP 135/84 | HR 64 | Temp 98.3°F | Ht 66.0 in | Wt 155.6 lb

## 2011-01-04 DIAGNOSIS — M25512 Pain in left shoulder: Secondary | ICD-10-CM

## 2011-01-04 DIAGNOSIS — M25511 Pain in right shoulder: Secondary | ICD-10-CM

## 2011-01-04 DIAGNOSIS — M25519 Pain in unspecified shoulder: Secondary | ICD-10-CM

## 2011-01-04 NOTE — Patient Instructions (Signed)
It was good to see you today. I am thinking that your shoulder pain might be related to pinching of some of your tendons in your shoulder.  I would like you to make an appointment with our sports medicine clinic for them to see and evaluate them.  Our front desk staff will be able to make that appointment for you. Come back to see your PCP about a week after that appointment.

## 2011-01-04 NOTE — Assessment & Plan Note (Signed)
The patient has been seen and evaluated for a number of pain complaints in the past.  His findings are not terribly convincing for a biologic cause, however it is possible that he is suffering from some form of rotator cuff impingement.  Will send him to sports medicine for further evaluation and possible ultrasound to determine if this is indeed the cause of his symptoms.

## 2011-01-04 NOTE — Progress Notes (Signed)
Subjective: Pt presents complaining of hip and shoulder pain (bilateral) that has been present for ~33months.  There have been no acute exacerbations.  He says the pain is primarily in his posterior shoulder and is made worse when raising his arms over his head.  The pain is also worse at night, although he reports that in the morning when he gets up if "feels like I am walking on gravel."  He says that it feels like his shoulder is out of joint.  Objective:  Filed Vitals:   01/04/11 0843  BP: 135/84  Pulse: 64  Temp: 98.3 F (36.8 C)   Gen: NAD Shoulder: There are no bony abnormalities on palpation of either shoulder.  No evidence of joint instability.  There is relative point tenderness on the posterior of both shoulders just inferior to the acromiom with abduction to >90 degrees.  There does not appear to be any change in this with internal/external rotation.  No decrease in ROM either active or passive.  No joint swelling or skin erythema.

## 2011-01-19 ENCOUNTER — Ambulatory Visit: Payer: Self-pay | Admitting: Family Medicine

## 2011-01-26 ENCOUNTER — Ambulatory Visit (INDEPENDENT_AMBULATORY_CARE_PROVIDER_SITE_OTHER): Payer: Self-pay | Admitting: Family Medicine

## 2011-01-26 ENCOUNTER — Encounter: Payer: Self-pay | Admitting: Family Medicine

## 2011-01-26 VITALS — BP 159/89 | HR 75 | Ht 66.0 in | Wt 155.0 lb

## 2011-01-26 DIAGNOSIS — M25519 Pain in unspecified shoulder: Secondary | ICD-10-CM

## 2011-01-26 DIAGNOSIS — M25511 Pain in right shoulder: Secondary | ICD-10-CM

## 2011-01-26 NOTE — Progress Notes (Signed)
  Subjective:    Patient ID: Adam [name], male    DOB: Jan 17, 1966, 45 y.o.   MRN: [id number]  HPI  Right shoulder pain gradually worsening over the last 2-4 months. Right-hand-dominant. 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 Pain is worse with any overhead activity. He cannot lie on that side at night secondary to pain. It does not radiate to the forearm or hand. Pain is 2-6/10 depending on his activity level. Review of Systems    denies numbness or tingling in his hand. Objective:   Physical Exam  GENERAL: Well-developed male no acute distress SHOULDER: Right. Pain with supraspinatus testing but full strength. Pain with placing her hand behind her head and behind her back. Full-strength in internal and external rotation. Distally neurovascular intact.  ULTRASOUND:   Rotator cuff muscles are intact. There is some evidence of calcification particularly in the supraspinatus and subscapularis consistent with previous healed minor tears. There is no one fluid in the subacromial bursa. There is no impingement sign.      Assessment & Plan:  Rotator cuff syndrome. Home exercise program given and explained with fair band. He's not improving he will return to clinic for 6 weeks and we can consider corticosteroid injection.

## 2011-01-29 LAB — CSF CELL COUNT WITH DIFFERENTIAL
RBC Count, CSF: 0
RBC Count, CSF: 1 — ABNORMAL HIGH
Tube #: 1
Tube #: 4
WBC, CSF: 1
WBC, CSF: 2

## 2011-01-29 LAB — GRAM STAIN

## 2011-01-29 LAB — BODY FLUID CULTURE: Culture: NO GROWTH

## 2011-01-29 LAB — PROTEIN, CSF: Total  Protein, CSF: 57 — ABNORMAL HIGH

## 2011-01-29 LAB — GLUCOSE, CSF: Glucose, CSF: 62

## 2011-01-30 ENCOUNTER — Ambulatory Visit (INDEPENDENT_AMBULATORY_CARE_PROVIDER_SITE_OTHER): Payer: Self-pay | Admitting: Family Medicine

## 2011-01-30 ENCOUNTER — Encounter: Payer: Self-pay | Admitting: Family Medicine

## 2011-01-30 DIAGNOSIS — K219 Gastro-esophageal reflux disease without esophagitis: Secondary | ICD-10-CM

## 2011-01-30 DIAGNOSIS — F329 Major depressive disorder, single episode, unspecified: Secondary | ICD-10-CM

## 2011-01-30 DIAGNOSIS — R0602 Shortness of breath: Secondary | ICD-10-CM | POA: Insufficient documentation

## 2011-01-30 DIAGNOSIS — I10 Essential (primary) hypertension: Secondary | ICD-10-CM

## 2011-01-30 DIAGNOSIS — F411 Generalized anxiety disorder: Secondary | ICD-10-CM

## 2011-01-30 LAB — COMPREHENSIVE METABOLIC PANEL
ALT: 17
AST: 23
Albumin: 4
CO2: 30
Chloride: 99
GFR calc Af Amer: >60
GFR calc non Af Amer: >60
Sodium: 138
Total Bilirubin: 0.8

## 2011-01-30 LAB — LIPID PANEL
HDL: 49
Total CHOL/HDL Ratio: 4.2
Triglycerides: 65

## 2011-01-30 LAB — POCT CARDIAC MARKERS
CKMB, poc: <1 — ABNORMAL LOW
Myoglobin, poc: 75
Operator id: 222501
Operator id: 222501
Troponin i, poc: <0.05

## 2011-01-30 LAB — POCT I-STAT, CHEM 8
BUN: 17
Chloride: 99
HCT: 43
Potassium: 3.3 — ABNORMAL LOW
Sodium: 137

## 2011-01-30 LAB — CARDIAC PANEL(CRET KIN+CKTOT+MB+TROPI)
Relative Index: INVALID
Troponin I: 0.02

## 2011-01-30 LAB — PROTIME-INR: INR: 0.9

## 2011-01-30 LAB — CK TOTAL AND CKMB (NOT AT ARMC): CK, MB: 1.5

## 2011-01-30 MED ORDER — RANITIDINE HCL 300 MG PO TABS: 300.0000 mg | ORAL_TABLET | Freq: Every evening | ORAL | Status: DC | PRN

## 2011-01-30 NOTE — Patient Instructions (Signed)
It was nice to meet you. I sent in a prescription for a medicine to help with your indigestion.  The directions say take as needed at bed time, but you can take it any time of the day that you need it. We are checking your testosterone level today to see if that is contributing to any of your symptoms. I think that going to see Dr. Kathrynn Running and Dr. Pascal Lux  as soon as they can get you in would be helpful.  Please call [phone] to schedule. Tell Dr. Pascal Lux you need to schedule for the Wednesday morning clinic with Dr. Kathrynn Running. Please come back and see me in the next few months to see how things are going.

## 2011-01-30 NOTE — Assessment & Plan Note (Signed)
May be contributing to occasional day time SOB since he has associated indigestion and bloating.  Pt does not like taking daily meds, so will start H2 blocker which can be effectively taken PRN.

## 2011-01-30 NOTE — Assessment & Plan Note (Signed)
Occurs at night and during the day. May have GERD component. Likely also has anxiety component. Lungs are clear, no SOB at this time, does not feel like he has a cough, no CP, no LE swelling. No red flags, will monitor.

## 2011-01-30 NOTE — Assessment & Plan Note (Signed)
Admits that this may play a small role in his fatigue and decreased libido but would like his testosterone level checked.  Admits to significant family history of depression/mental illness.  Is more focused on/willing to accept his anxiety and is willing to meet w/ Dr. Norm Parcel in [facility].

## 2011-01-30 NOTE — Progress Notes (Signed)
S: Pt comes in today to meet new MD, multiple complaints.  SHORTNESS OF BREATH Has both day time and night time episodes.  During day, associated w/ indigestion and bloated feelings, thinks it is related to GERD as he has been dx'ed with this in a past.  At night, feels like he is smothering when he's laying down.  Does snore at night per wife's report and does occasionally stop breathing and wake himself up.  No CP, dizziness, leg swelling, headache, vision changes, or any other concerning symptoms.    FATIGUE/VAGUE COMPLAINTS Thinks that maybe depression has a small role in this but also has other complaints he is concerned with.  Is tired all of the time, wakes up more tired than when he went to sleep.  Wakes up at 5am and can't sleep anymore, does not go to lay down until 11p or 12/MN b/c he knows he is going to have the smothering feeling. Feels like he has no get up and go and isn't as outgoing. Newest problem for the past few weeks is no sex drive or desire for sex.  Also having difficulty both initiating and maintaining an erection.  Would like his testosterone checked. Aware that thyroid and CBC were normal.   ANXIETY Per pt, only medicine that has ever helped is xanax. Takes 0.25 to 0.5mg  per day- uses his friends and family's since we won't Rx it.   Using xanax at bed time for help with sleep.  Feels like SOB at night time may be related to anxiety.    ROS: Per HPI  History  Smoking status  . Never Smoker   Smokeless tobacco  . Never Used    O:  Filed Vitals:   01/30/11 1011  BP: 137/84  Pulse: 61    Gen: NAD Psych: A&Ox4, no disorders of mood, appropriate CV: RRR Pulm: clear bilaterally, no wheezes, rales, rhonchi    A/P: 45 y.o. male p/w depression, anxiety, SOB -See problem list -f/u in a few months

## 2011-01-30 NOTE — Assessment & Plan Note (Signed)
BP at goal. Continue current txt.  BP Readings from Last 3 Encounters:  01/30/11 137/84  01/26/11 159/89  01/04/11 135/84

## 2011-01-30 NOTE — Assessment & Plan Note (Signed)
Per pt, this is his major problem.  Willing to go to [facility] for help.  Per pt, he has tried and failed wellbutrin, zoloft (which made him have anger issues) and celexa (caused drowsiness and dry mouth).  Only thing that works is Xanax, which WE ARE NOT PRESCRIBING, he is getting it from family/friends. No SI/HI. Likely related to his lack of sleep.

## 2011-01-31 ENCOUNTER — Encounter: Payer: Self-pay | Admitting: Family Medicine

## 2011-01-31 LAB — TESTOSTERONE, FREE, TOTAL, SHBG: Testosterone, Free: 101.3 pg/mL (ref 47.0–244.0)

## 2011-02-02 ENCOUNTER — Encounter: Payer: Self-pay | Admitting: Family Medicine

## 2011-02-07 ENCOUNTER — Telehealth: Payer: Self-pay | Admitting: Psychology

## 2011-02-07 NOTE — Telephone Encounter (Signed)
Patient's wife called when I was out of town.  Returned call today to schedule in [facility].  Spoke to [place].  Scheduled for first available for a new patient which is not until 11/21 at 10:00.  Discussed options for getting seen sooner including [facility]).  He has no insurance so options are limited.  He wished to wait for [facility].  He asked the main difference between mood and anxiety.  Discussed briefly.  Mentioned the stress of having seven kids at home (who are like demons) waiting for him after work.  Will discuss these and other stressors when we see him.  I told him about the nature of the [facility] clinic, specifically, who would be present and that it would focus on his medications.  If he is unable to attend the appt, he agreed he would call and stated an understanding that I would have difficulty rescheduling him in this clinic if he does not show.

## 2011-02-08 ENCOUNTER — Emergency Department (HOSPITAL_COMMUNITY): Payer: Self-pay

## 2011-02-08 ENCOUNTER — Emergency Department (HOSPITAL_COMMUNITY)
Admission: EM | Admit: 2011-02-08 | Discharge: 2011-02-08 | Disposition: A | Discharge status: Home / Self Care | Payer: Self-pay | Attending: Emergency Medicine | Admitting: Emergency Medicine

## 2011-02-08 DIAGNOSIS — I10 Essential (primary) hypertension: Secondary | ICD-10-CM | POA: Insufficient documentation

## 2011-02-08 DIAGNOSIS — R209 Unspecified disturbances of skin sensation: Secondary | ICD-10-CM | POA: Insufficient documentation

## 2011-02-08 DIAGNOSIS — R110 Nausea: Secondary | ICD-10-CM | POA: Insufficient documentation

## 2011-02-08 DIAGNOSIS — R0789 Other chest pain: Secondary | ICD-10-CM | POA: Insufficient documentation

## 2011-02-08 DIAGNOSIS — R42 Dizziness and giddiness: Secondary | ICD-10-CM | POA: Insufficient documentation

## 2011-02-08 DIAGNOSIS — R0602 Shortness of breath: Secondary | ICD-10-CM | POA: Insufficient documentation

## 2011-02-08 DIAGNOSIS — R51 Headache: Secondary | ICD-10-CM | POA: Insufficient documentation

## 2011-02-08 DIAGNOSIS — R29898 Other symptoms and signs involving the musculoskeletal system: Secondary | ICD-10-CM | POA: Insufficient documentation

## 2011-02-08 LAB — CBC
HCT: 45 % (ref 39.0–52.0)
Hemoglobin: 16.1 g/dL (ref 13.0–17.0)
MCHC: 35.8 g/dL (ref 30.0–36.0)
MCV: 82 fL (ref 78.0–100.0)
RDW: 12.1 % (ref 11.5–15.5)
WBC: 11.5 10*3/uL — ABNORMAL HIGH (ref 4.0–10.5)

## 2011-02-08 LAB — DIFFERENTIAL
Basophils Absolute: 0.1 10*3/uL (ref 0.0–0.1)
Eosinophils Relative: 2 % (ref 0–5)
Lymphocytes Relative: 13 % (ref 12–46)
Lymphs Abs: 1.5 10*3/uL (ref 0.7–4.0)
Monocytes Absolute: 0.9 10*3/uL (ref 0.1–1.0)
Neutro Abs: 8.8 10*3/uL — ABNORMAL HIGH (ref 1.7–7.7)

## 2011-02-08 LAB — BASIC METABOLIC PANEL
BUN: 18 mg/dL (ref 6–23)
Chloride: 104 mEq/L (ref 96–112)
Creatinine, Ser: 1.01 mg/dL (ref 0.50–1.35)
GFR calc Af Amer: >90 mL/min (ref >90)
Glucose, Bld: 108 mg/dL — ABNORMAL HIGH (ref 70–99)
Potassium: 4.4 mEq/L (ref 3.5–5.1)

## 2011-02-12 LAB — I-STAT 8, (EC8 V) (CONVERTED LAB)
Acid-Base Excess: 1
Bicarbonate: 22.7
HCT: 43
Hemoglobin: 14.6
Operator id: 257131
Potassium: 3.2 — ABNORMAL LOW
Sodium: 139
TCO2: 24

## 2011-02-12 LAB — POCT CARDIAC MARKERS
CKMB, poc: 1.6
Myoglobin, poc: 73.5

## 2011-02-12 LAB — TROPONIN I: Troponin I: 0.03

## 2011-02-15 ENCOUNTER — Ambulatory Visit (INDEPENDENT_AMBULATORY_CARE_PROVIDER_SITE_OTHER): Payer: Self-pay | Admitting: Family Medicine

## 2011-02-15 ENCOUNTER — Encounter: Payer: Self-pay | Admitting: Family Medicine

## 2011-02-15 VITALS — BP 142/91 | HR 60 | Wt 158.7 lb

## 2011-02-15 DIAGNOSIS — K047 Periapical abscess without sinus: Secondary | ICD-10-CM

## 2011-02-15 HISTORY — DX: Periapical abscess without sinus: K04.7

## 2011-02-15 MED ORDER — HYDROCODONE-ACETAMINOPHEN 5-325 MG PO TABS: 1.0000 | ORAL_TABLET | Freq: Four times a day (QID) | ORAL | Status: AC | PRN

## 2011-02-15 MED ORDER — CLINDAMYCIN HCL 150 MG PO CAPS: 450.0000 mg | ORAL_CAPSULE | Freq: Three times a day (TID) | ORAL | Status: DC

## 2011-02-15 NOTE — Patient Instructions (Signed)
It was nice meeting you today.  You have an abscess of your lower tooth.  Please take all your antibiotics until they are all gone.  We are sending over a referral for the dentist.

## 2011-02-16 LAB — POCT HEMOGLOBIN-HEMACUE
Hemoglobin: 15.3
Operator id: 268271

## 2011-02-18 NOTE — Progress Notes (Signed)
  Subjective:    Patient ID: Adam [name], male    DOB: 04-21-1966, 45 y.o.   MRN: [id number]  HPI 1. Dental pain:  Comes in today with complaint of tooth pain and would like a dental referral.  Pain is on back lower tooth on the L and back upper tooth on the R.   Has been ongoing for the past 3-4 days, feels like a throbbing sensation.  Has had difficulty eating 2/2 to pain.  Denies fever/chills.   Review of Systems     Objective:   Physical Exam  Constitutional: He appears well-developed and well-nourished.  HENT:  Head: Normocephalic.  Mouth/Throat: Uvula is midline and oropharynx is clear and moist. Dental abscesses and dental caries present.       Caries in upper and lower molars. There is an abscess surrounding the lower molar on the left side with friability of the gum upon palpation with a cotton swab.  Neck: Neck supple.  Lymphadenopathy:    He has no cervical adenopathy.          Assessment & Plan:

## 2011-02-18 NOTE — Assessment & Plan Note (Signed)
Dental abscess of LL molar.  Will treat acutely with clindamycin since allergic to PCN. Will give short term vicodin for pain control.  Referral for dentist made.  Given red flags.

## 2011-02-21 LAB — POCT URINALYSIS DIP (DEVICE)
Bilirubin Urine: NEGATIVE
Glucose, UA: NEGATIVE
Hgb urine dipstick: NEGATIVE
Ketones, ur: NEGATIVE
Nitrite: NEGATIVE
Operator id: 247071
Protein, ur: NEGATIVE
Specific Gravity, Urine: 1.015
Urobilinogen, UA: 0.2
pH: 7

## 2011-02-22 ENCOUNTER — Emergency Department (HOSPITAL_COMMUNITY)
Admission: EM | Admit: 2011-02-22 | Discharge: 2011-02-23 | Disposition: A | Discharge status: Home / Self Care | Payer: Self-pay | Attending: Emergency Medicine | Admitting: Emergency Medicine

## 2011-02-22 ENCOUNTER — Inpatient Hospital Stay (INDEPENDENT_AMBULATORY_CARE_PROVIDER_SITE_OTHER)
Admission: RE | Admit: 2011-02-22 | Discharge: 2011-02-22 | Disposition: A | Discharge status: Home / Self Care | Payer: Self-pay | Source: Ambulatory Visit | Attending: Family Medicine | Admitting: Family Medicine

## 2011-02-22 DIAGNOSIS — K4090 Unilateral inguinal hernia, without obstruction or gangrene, not specified as recurrent: Secondary | ICD-10-CM | POA: Insufficient documentation

## 2011-02-22 DIAGNOSIS — R110 Nausea: Secondary | ICD-10-CM | POA: Insufficient documentation

## 2011-02-22 DIAGNOSIS — I10 Essential (primary) hypertension: Secondary | ICD-10-CM | POA: Insufficient documentation

## 2011-02-22 DIAGNOSIS — R109 Unspecified abdominal pain: Secondary | ICD-10-CM

## 2011-02-22 DIAGNOSIS — R1031 Right lower quadrant pain: Secondary | ICD-10-CM | POA: Insufficient documentation

## 2011-02-22 LAB — HEPATIC FUNCTION PANEL
AST: 19
Albumin: 4
Alkaline Phosphatase: 74
Total Protein: 6.6

## 2011-02-22 LAB — I-STAT 8, (EC8 V) (CONVERTED LAB)
BUN: 12
Bicarbonate: 25.2 — ABNORMAL HIGH
Chloride: 106
HCT: 46
Hemoglobin: 15.6
Operator id: 279831
Sodium: 140

## 2011-02-22 LAB — CBC
MCV: 83.3
Platelets: 230
WBC: 5.6

## 2011-02-22 LAB — URINALYSIS, ROUTINE W REFLEX MICROSCOPIC
Bilirubin Urine: NEGATIVE
Bilirubin Urine: NEGATIVE
Glucose, UA: NEGATIVE
Glucose, UA: NEGATIVE mg/dL
Hgb urine dipstick: NEGATIVE
Ketones, ur: NEGATIVE
Ketones, ur: NEGATIVE mg/dL
Nitrite: NEGATIVE
Protein, ur: NEGATIVE
Specific Gravity, Urine: 1.028 (ref 1.005–1.030)
pH: 6.5 (ref 5.0–8.0)

## 2011-02-22 LAB — DIFFERENTIAL
Basophils Relative: 0
Eosinophils Absolute: 0.2
Lymphs Abs: 1.3
Neutro Abs: 3.7
Neutrophils Relative %: 66

## 2011-02-22 LAB — OCCULT BLOOD X 1 CARD TO LAB, STOOL: Fecal Occult Bld: NEGATIVE

## 2011-02-23 ENCOUNTER — Encounter (INDEPENDENT_AMBULATORY_CARE_PROVIDER_SITE_OTHER): Payer: Self-pay | Admitting: Surgery

## 2011-02-23 LAB — DIFFERENTIAL
Eosinophils Relative: 6 % — ABNORMAL HIGH (ref 0–5)
Lymphocytes Relative: 28 % (ref 12–46)
Lymphs Abs: 2.3 10*3/uL (ref 0.7–4.0)
Monocytes Absolute: 0.8 10*3/uL (ref 0.1–1.0)
Monocytes Relative: 10 % (ref 3–12)
Neutro Abs: 4.6 10*3/uL (ref 1.7–7.7)

## 2011-02-23 LAB — CBC
HCT: 44.4 % (ref 39.0–52.0)
Hemoglobin: 15.6 g/dL (ref 13.0–17.0)
MCHC: 35.1 g/dL (ref 30.0–36.0)
MCV: 81.6 fL (ref 78.0–100.0)
RDW: 12.2 % (ref 11.5–15.5)

## 2011-02-26 ENCOUNTER — Ambulatory Visit (INDEPENDENT_AMBULATORY_CARE_PROVIDER_SITE_OTHER): Payer: PRIVATE HEALTH INSURANCE | Admitting: Surgery

## 2011-02-26 ENCOUNTER — Telehealth: Payer: Self-pay | Admitting: Family Medicine

## 2011-02-26 ENCOUNTER — Encounter (INDEPENDENT_AMBULATORY_CARE_PROVIDER_SITE_OTHER): Payer: Self-pay | Admitting: Surgery

## 2011-02-26 VITALS — BP 128/82 | HR 64 | Temp 96.9°F | Resp 14 | Ht 66.0 in | Wt 155.8 lb

## 2011-02-26 DIAGNOSIS — K4020 Bilateral inguinal hernia, without obstruction or gangrene, not specified as recurrent: Secondary | ICD-10-CM

## 2011-02-26 HISTORY — DX: Bilateral inguinal hernia, without obstruction or gangrene, not specified as recurrent: K40.20

## 2011-02-26 NOTE — Telephone Encounter (Signed)
I will not refill pain meds without an office visit.

## 2011-02-26 NOTE — Telephone Encounter (Signed)
Wants to know status of his dental referral Also wants to know if he can get refill on pain meds - [facility]

## 2011-02-26 NOTE — Progress Notes (Signed)
Chief Complaint  Patient presents with  . Other    Eval RIH    HPI Adam [name] is a 45 y.o. male.   HPIThis is a pleasant gentleman referred by Dr. Fara Boros for evaluation of a possible right inguinal hernia. The patient been having pain for several weeks in his right groin. He has not noticed a bulge. The pain is described as sharp ring down to his testicle. He cannot relate any trauma or heavy lifting. The pain is intermittent. He has no tract symptoms. He is moving his bowels well and has no difficulty or  Past Medical History  Diagnosis Date  . Hypertension   . Chest pain   . Palpitations   . Dizziness and giddiness   . Esophageal reflux   . Backache, unspecified   . Pain in joint, pelvic region and thigh   . Pain in joint, hand   . Family history of stress   . Major depressive disorder, single episode, unspecified   . Abdominal  pain, other specified site   . Prostatitis, unspecified   . Panic disorder without agoraphobia   . Anxiety state, unspecified   . Unspecified cardiovascular disease   . RBBB (right bundle branch block)   . IBS (irritable bowel syndrome)   . Hiatal hernia   . Generalized headaches   . Visual disturbance   . Abdominal distention     Past Surgical History  Procedure Date  . Pyloric stenosis    . 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 Right arm     fracture--Right arm fracture s/p ORIF from roller blading.  . Esophagogastroduodenoscopy     Family History  Problem Relation Age of Onset  . Heart attack Mother 28    PTCA  and 3 stents  . Hodgkin's lymphoma Maternal Grandmother     Social History History  Substance Use Topics  . Smoking status: Never Smoker   . Smokeless tobacco: Never Used  . Alcohol Use: No    Allergies  Allergen Reactions  . Hctz (Hydrochlorothiazide)   . Penicillins     REACTION: Patient was told he was allergic but  reaction is not known.    Current Outpatient Prescriptions    Medication Sig Dispense Refill  . ALPRAZolam (XANAX) 0.5 MG tablet Take 0.5 mg by mouth as needed.        [name] aspirin 81 MG tablet Take 81 mg by mouth daily.        . cetirizine (ZYRTEC) 10 MG tablet Take one nightly for allergies       . clindamycin (CLEOCIN) 150 MG capsule Take 3 capsules (450 mg total) by mouth 3 (three) times daily.  90 capsule  0  . fluticasone (FLONASE) 50 MCG/ACT nasal spray 2 sprays by Nasal route daily.        [name] HYDROcodone-acetaminophen (NORCO) 5-325 MG per tablet Take 1 tablet by mouth every 6 (six) hours as needed for pain.  60 tablet  0  . metoprolol (TOPROL-XL) 50 MG 24 hr tablet Take 0.5 tablets (25 mg total) by mouth daily.  30 tablet  0  . omeprazole (PRILOSEC) 40 MG capsule Take 1 capsule (40 mg total) by mouth daily.  30 capsule  11  . Psyllium 100 % PACK Take 1 packet by mouth 3 (three) times daily with meals.  100 g  11  . ranitidine (ZANTAC) 300 MG tablet Take 1 tablet (300 mg total) by mouth at  bedtime as needed for heartburn.  30 tablet  6    Review of Systems Review of Systems  Constitutional: Negative for fever, chills and unexpected weight change.  HENT: Negative for hearing loss, congestion, sore throat, trouble swallowing and voice change.   Eyes: Negative for visual disturbance.  Respiratory: Negative for cough and wheezing.   Cardiovascular: Negative for chest pain, palpitations and leg swelling.  Gastrointestinal: Negative for nausea, vomiting, abdominal pain, diarrhea, constipation, blood in stool, abdominal distention, anal bleeding and rectal pain.  Genitourinary: Negative for hematuria and difficulty urinating.  Musculoskeletal: Negative for arthralgias.  Skin: Negative for rash and wound.  Neurological: Negative for seizures, syncope, weakness and headaches.  Hematological: Negative for adenopathy. Does not bruise/bleed easily.  Psychiatric/Behavioral: Negative for confusion.    Blood pressure 128/82, pulse 64, temperature 96.9 F  (36.1 C), temperature source Temporal, resp. rate 14, height 5\' 6"  (1.676 m), weight 155 lb 12.8 oz (70.67 kg).  Physical Exam Physical Exam  Constitutional: He is oriented to person, place, and time. He appears well-developed and well-nourished. No distress.  HENT:  Head: Normocephalic and atraumatic.  Right Ear: External ear normal.  Left Ear: External ear normal.  Nose: Nose normal.  Mouth/Throat: Oropharynx is clear and moist. No oropharyngeal exudate.  Eyes: Conjunctivae are normal. Pupils are equal, round, and reactive to light. Scleral icterus is present.  Neck: Normal range of motion. No thyromegaly present.  Cardiovascular: Normal rate, regular rhythm, normal heart sounds and intact distal pulses.   No murmur heard. Pulmonary/Chest: Effort normal and breath sounds normal. No respiratory distress. He has no wheezes.  Abdominal: Soft. Bowel sounds are normal. He exhibits no distension. There is no tenderness. There is no rebound.       He has a well-healed incision in his left upper quadrant. There is no umbilical hernia. There are bilateral reducible inguinal hernias. The left is greater in size than the right  Musculoskeletal: He exhibits no edema and no tenderness.  Lymphadenopathy:    He has no cervical adenopathy.  Neurological: He is alert and oriented to person, place, and time.  Skin: Skin is warm and dry. No rash noted. No erythema.  Psychiatric: His behavior is normal. Judgment normal.    Data Reviewed   Assessment    Patient with bilateral inguinal hernias    Plan    I discussed repair with him. I discussed both laparoscopic and open techniques. I would recommend a laparoscopic technique as is his bilateral. I also discussed the use of mesh. I discussed the risk of surgery which includes but is not limited to bleeding, infection, use of mesh, recurrence, injury to surrounding structures, nerve entrapment, chronic pain, etc. Likelihood of a good outcome as  expected. I also discussed postoperative recovery. Surgery will be scheduled       Melvenia Favela A 02/26/2011, 9:41 AM

## 2011-02-26 NOTE — Telephone Encounter (Signed)
Called pt. Referral faxed to the dental clinic (urgent). Pt said, that he is out of his pain medications. i told the pt that I don't know, if Dr.McGill would refill his pain meds. Lorenda Hatchet, [name]

## 2011-02-27 NOTE — Telephone Encounter (Signed)
Called pt and left message to return call. PLEASE tell pt needs OV for refill  Pain meds. Dental Clinic is aware that he needs appt and they will get in contact with him. Lorenda Hatchet, [name]

## 2011-02-28 ENCOUNTER — Other Ambulatory Visit: Payer: Self-pay | Admitting: Family Medicine

## 2011-02-28 DIAGNOSIS — I10 Essential (primary) hypertension: Secondary | ICD-10-CM

## 2011-02-28 MED ORDER — METOPROLOL SUCCINATE ER 50 MG PO TB24: 25.0000 mg | ORAL_TABLET | Freq: Every day | ORAL | Status: DC

## 2011-03-02 ENCOUNTER — Encounter: Payer: Self-pay | Admitting: Family Medicine

## 2011-03-02 ENCOUNTER — Ambulatory Visit (INDEPENDENT_AMBULATORY_CARE_PROVIDER_SITE_OTHER): Payer: Self-pay | Admitting: Family Medicine

## 2011-03-02 DIAGNOSIS — K4020 Bilateral inguinal hernia, without obstruction or gangrene, not specified as recurrent: Secondary | ICD-10-CM | POA: Insufficient documentation

## 2011-03-02 DIAGNOSIS — I10 Essential (primary) hypertension: Secondary | ICD-10-CM

## 2011-03-02 DIAGNOSIS — K047 Periapical abscess without sinus: Secondary | ICD-10-CM

## 2011-03-02 MED ORDER — IBUPROFEN 800 MG PO TABS: 800.0000 mg | ORAL_TABLET | Freq: Three times a day (TID) | ORAL | Status: AC | PRN

## 2011-03-02 MED ORDER — CLINDAMYCIN HCL 300 MG PO CAPS: 600.0000 mg | ORAL_CAPSULE | Freq: Three times a day (TID) | ORAL | Status: DC

## 2011-03-02 NOTE — Assessment & Plan Note (Signed)
Pt with dental appt 04/03/11 for tooth extraction.  Will repeat clindamycin x10 days.  Would expect pain to get better w/ abx treatment.  No need for narcotics since txt of pain is abx. Given infectious red flags to return for.

## 2011-03-02 NOTE — Patient Instructions (Addendum)
Concerns are that you're still having pain. I am sending in a second course of antibiotics for you to take for the next 14 days. This should help treat your dental pain. The only treatment for your hernia pain is going to be surgery. For your pain, you can take ibuprofen 800 mg 3 times a day for the next 10 days. I do not want to give you anything stronger as it could mask a bad complication of your hernias. I sent in a prescription for this along with your antibiotic. Keep calling your dentist and see if you can get seen before the end of next month. The metoprolol is waiting at the pharmacy for you. Please come back and see me in the next 2-3 months that we can check on your blood pressure. Please come back sooner if you start having fevers or chills, swelling, redness, or warmth over your left jaw or overall start having malaise.

## 2011-03-02 NOTE — Assessment & Plan Note (Signed)
Surgery scheduled 03/14/11.  Encouraged pt to take motrin 800mg  TID for pain.  Do not want to use narcotics and possibly mask the pain of acute incarceration. Pain will likely not be relieved until surgery.

## 2011-03-02 NOTE — Progress Notes (Signed)
S: Pt comes in today for pain.  HERNIA PAIN Surgery scheduled for 03/14/11 to repair this after being seen at urgent care.  Pain going down into testicles, which is not new-- was present when he saw the surgeon on Monday.  No redness, swelling, or warmth.  No changes in bowel habits, no blood in stool. Pain is not worse or different, just not getting better.    DENTAL PAIN 8/10, sensitive. No fever or chills. Has been taking hydrocodone 4/day (has 2 left)- aren't helping.  Has dental appt 11/27 for L-sided abscess.  Tried ibuprofen and tylenol, which didn't help; tried oragel which made it tingly but didn't help pain. Pain is achy, worst with eating.  No swelling or redness in his jaw. Pain is not worse or different, just not getting better.    ROS: Per HPI  History  Smoking status  . Never Smoker   Smokeless tobacco  . Never Used    O:  Filed Vitals:   03/02/11 1540  BP: 134/80  Pulse: 53  Temp: 98.2 F (36.8 C)    Gen: NAD HEENT: MMM, EOMI, no Lysander abscess or pus from left side of jaw, no erythema, edema or tenderness to palpation on exam, no cervical LAD CV: RRR, no murmur Pulm: CTA bilat, no wheezes or crackles Abd: soft, mild BLQ tenderness PT REFUSES GENITAL/INGUINAL EXAM Ext: Warm, no chronic skin changes, no edema   A/P: 46 y.o. male p/w dental and hernia pain -See problem list -f/u 2 months for BP check

## 2011-03-06 ENCOUNTER — Encounter (HOSPITAL_COMMUNITY): Payer: Self-pay | Admitting: Pharmacy Technician

## 2011-03-12 ENCOUNTER — Encounter (HOSPITAL_COMMUNITY): Payer: Self-pay | Attending: Surgery

## 2011-03-12 ENCOUNTER — Encounter (HOSPITAL_COMMUNITY): Payer: Self-pay

## 2011-03-12 DIAGNOSIS — K469 Unspecified abdominal hernia without obstruction or gangrene: Secondary | ICD-10-CM | POA: Insufficient documentation

## 2011-03-12 DIAGNOSIS — Z01812 Encounter for preprocedural laboratory examination: Secondary | ICD-10-CM | POA: Insufficient documentation

## 2011-03-12 LAB — SURGICAL PCR SCREEN
MRSA, PCR: NEGATIVE
Staphylococcus aureus: NEGATIVE

## 2011-03-12 LAB — BASIC METABOLIC PANEL
CO2: 30 mEq/L (ref 19–32)
Calcium: 9.8 mg/dL (ref 8.4–10.5)
Creatinine, Ser: 0.97 mg/dL (ref 0.50–1.35)
GFR calc non Af Amer: >90 mL/min (ref >90)
Glucose, Bld: 105 mg/dL — ABNORMAL HIGH (ref 70–99)
Sodium: 138 mEq/L (ref 135–145)

## 2011-03-12 LAB — CBC
MCH: 28.7 pg (ref 26.0–34.0)
MCHC: 34.5 g/dL (ref 30.0–36.0)
MCV: 83 fL (ref 78.0–100.0)
Platelets: 232 10*3/uL (ref 150–400)

## 2011-03-12 NOTE — Patient Instructions (Signed)
20 Adam [name]  03/12/2011   Your procedure is scheduled on:  03/14/2011  Report to [facility] at 1215 pm  Call this number if you have problems the morning of surgery: [phone]   Remember:   Do not eat food:After Midnight.  Do not drink clear liquids: 6 Hours before arrival midnight until 0845 am then nothing.  Take these medicines the morning of surgery with A SIP OF WATER: Metoprol   Do not wear jewelry, make-up or nail polish.  Do not wear lotions, powders, or perfumes.   Do not shave 48 hours prior to surgery.  Do not bring valuables to the hospital.  Contacts, dentures or bridgework may not be worn into surgery.  Leave suitcase in the car. After surgery it may be brought to your room.  For patients admitted to the hospital, checkout time is 11:00 AM the day of discharge.   Patients discharged the day of surgery will not be allowed to drive home.  Name and phone number of your driver: [phone]  Special Instructions: CHG Shower Use Special Wash: 1/2 bottle night before surgery and 1/2 bottle morning of surgery.   Please read over the following fact sheets that you were given: MRSA Information

## 2011-03-12 NOTE — Pre-Procedure Instructions (Signed)
EKG-02/08/2011-[place], and Chest 2 view x-ray on chart 02/08/2011

## 2011-03-14 ENCOUNTER — Encounter (HOSPITAL_COMMUNITY): Payer: Self-pay | Admitting: Anesthesiology

## 2011-03-14 ENCOUNTER — Encounter (HOSPITAL_COMMUNITY)
Admission: RE | Disposition: A | Discharge status: Home / Self Care | Payer: Self-pay | Source: Ambulatory Visit | Attending: Surgery

## 2011-03-14 ENCOUNTER — Inpatient Hospital Stay (HOSPITAL_COMMUNITY)
Admission: RE | Admit: 2011-03-14 | Discharge: 2011-03-15 | DRG: 352 | Disposition: A | Discharge status: Home / Self Care | Payer: Self-pay | Source: Ambulatory Visit | Attending: Surgery | Admitting: Surgery

## 2011-03-14 ENCOUNTER — Ambulatory Visit (HOSPITAL_COMMUNITY): Payer: Self-pay | Admitting: Anesthesiology

## 2011-03-14 ENCOUNTER — Encounter (HOSPITAL_COMMUNITY): Payer: Self-pay | Admitting: *Deleted

## 2011-03-14 DIAGNOSIS — Z01812 Encounter for preprocedural laboratory examination: Secondary | ICD-10-CM

## 2011-03-14 DIAGNOSIS — K4020 Bilateral inguinal hernia, without obstruction or gangrene, not specified as recurrent: Principal | ICD-10-CM | POA: Insufficient documentation

## 2011-03-14 DIAGNOSIS — K219 Gastro-esophageal reflux disease without esophagitis: Secondary | ICD-10-CM | POA: Diagnosis present

## 2011-03-14 DIAGNOSIS — I2510 Atherosclerotic heart disease of native coronary artery without angina pectoris: Secondary | ICD-10-CM | POA: Diagnosis present

## 2011-03-14 DIAGNOSIS — F329 Major depressive disorder, single episode, unspecified: Secondary | ICD-10-CM | POA: Diagnosis present

## 2011-03-14 DIAGNOSIS — F3289 Other specified depressive episodes: Secondary | ICD-10-CM | POA: Diagnosis present

## 2011-03-14 DIAGNOSIS — I10 Essential (primary) hypertension: Secondary | ICD-10-CM | POA: Diagnosis present

## 2011-03-14 DIAGNOSIS — F411 Generalized anxiety disorder: Secondary | ICD-10-CM | POA: Diagnosis present

## 2011-03-14 HISTORY — PX: INGUINAL HERNIA REPAIR: SHX194

## 2011-03-14 HISTORY — PX: HERNIA REPAIR: SHX51

## 2011-03-14 SURGERY — REPAIR, HERNIA, INGUINAL, BILATERAL, LAPAROSCOPIC
Anesthesia: General | Wound class: Clean

## 2011-03-14 MED ORDER — CIPROFLOXACIN IN D5W 400 MG/200ML IV SOLN
400.0000 mg | INTRAVENOUS | Status: AC
  Administered 2011-03-14: 400 mg via INTRAVENOUS

## 2011-03-14 MED ORDER — MIDAZOLAM HCL 5 MG/5ML IJ SOLN
INTRAMUSCULAR | Status: DC | PRN
  Administered 2011-03-14: 1 mg via INTRAVENOUS

## 2011-03-14 MED ORDER — PROMETHAZINE HCL 25 MG/ML IJ SOLN: 6.2500 mg | INTRAMUSCULAR | Status: DC | PRN

## 2011-03-14 MED ORDER — ONDANSETRON HCL 4 MG/2ML IJ SOLN
4.0000 mg | Freq: Four times a day (QID) | INTRAMUSCULAR | Status: DC | PRN
  Filled 2011-03-14: qty 2

## 2011-03-14 MED ORDER — BUPIVACAINE LIPOSOME 1.3 % IJ SUSP
20.0000 mL | INTRAMUSCULAR | Status: AC
  Filled 2011-03-14: qty 20

## 2011-03-14 MED ORDER — ACETAMINOPHEN 10 MG/ML IV SOLN
1000.0000 mg | Freq: Once | INTRAVENOUS | Status: AC
  Administered 2011-03-15: 1000 mg via INTRAVENOUS
  Filled 2011-03-14: qty 100

## 2011-03-14 MED ORDER — LACTATED RINGERS IV SOLN: INTRAVENOUS | Status: DC | PRN

## 2011-03-14 MED ORDER — ACETAMINOPHEN 650 MG RE SUPP
650.0000 mg | RECTAL | Status: DC | PRN
  Filled 2011-03-14: qty 1

## 2011-03-14 MED ORDER — ALPRAZOLAM 0.5 MG PO TABS
0.5000 mg | ORAL_TABLET | Freq: Every evening | ORAL | Status: DC | PRN
  Administered 2011-03-14: 0.5 mg via ORAL
  Filled 2011-03-14: qty 1

## 2011-03-14 MED ORDER — PROPOFOL 10 MG/ML IV EMUL
INTRAVENOUS | Status: DC | PRN
  Administered 2011-03-14: 200 mg via INTRAVENOUS

## 2011-03-14 MED ORDER — CIPROFLOXACIN IN D5W 400 MG/200ML IV SOLN
INTRAVENOUS | Status: AC
  Filled 2011-03-14: qty 200

## 2011-03-14 MED ORDER — MORPHINE SULFATE 4 MG/ML IJ SOLN
INTRAMUSCULAR | Status: AC
  Administered 2011-03-14: 2 mg via INTRAVENOUS
  Filled 2011-03-14: qty 1

## 2011-03-14 MED ORDER — BUPIVACAINE LIPOSOME 1.3 % IJ SUSP
INTRAMUSCULAR | Status: DC | PRN
  Administered 2011-03-14: 20 mL

## 2011-03-14 MED ORDER — FENTANYL CITRATE 0.05 MG/ML IJ SOLN
INTRAMUSCULAR | Status: AC
  Filled 2011-03-14: qty 2

## 2011-03-14 MED ORDER — CISATRACURIUM BESYLATE 2 MG/ML IV SOLN
INTRAVENOUS | Status: DC | PRN
  Administered 2011-03-14: 6 mg via INTRAVENOUS

## 2011-03-14 MED ORDER — SODIUM CHLORIDE 0.9 % IJ SOLN: 3.0000 mL | Freq: Two times a day (BID) | INTRAMUSCULAR | Status: DC

## 2011-03-14 MED ORDER — MORPHINE SULFATE 2 MG/ML IJ SOLN
2.0000 mg | INTRAMUSCULAR | Status: DC | PRN
  Filled 2011-03-14: qty 1

## 2011-03-14 MED ORDER — ONDANSETRON HCL 4 MG/2ML IJ SOLN
INTRAMUSCULAR | Status: DC | PRN
  Administered 2011-03-14: 4 mg via INTRAVENOUS

## 2011-03-14 MED ORDER — ACETAMINOPHEN 10 MG/ML IV SOLN
INTRAVENOUS | Status: AC
  Filled 2011-03-14: qty 100

## 2011-03-14 MED ORDER — OXYCODONE HCL 5 MG PO TABS
5.0000 mg | ORAL_TABLET | ORAL | Status: DC | PRN
  Administered 2011-03-14: 10 mg via ORAL
  Administered 2011-03-15: 5 mg via ORAL
  Filled 2011-03-14 (×2): qty 2
  Filled 2011-03-14: qty 1

## 2011-03-14 MED ORDER — KETOROLAC TROMETHAMINE 15 MG/ML IJ SOLN
15.0000 mg | Freq: Once | INTRAMUSCULAR | Status: AC
  Administered 2011-03-14: 15 mg via INTRAVENOUS
  Filled 2011-03-14: qty 1

## 2011-03-14 MED ORDER — PROMETHAZINE HCL 25 MG/ML IJ SOLN
12.5000 mg | Freq: Four times a day (QID) | INTRAMUSCULAR | Status: DC | PRN
  Filled 2011-03-14: qty 1

## 2011-03-14 MED ORDER — FENTANYL CITRATE 0.05 MG/ML IJ SOLN
25.0000 ug | INTRAMUSCULAR | Status: DC | PRN
  Administered 2011-03-14: 50 ug via INTRAVENOUS

## 2011-03-14 MED ORDER — LIDOCAINE HCL 1 % IJ SOLN
INTRAMUSCULAR | Status: DC | PRN
  Administered 2011-03-14: 40 mg via INTRADERMAL

## 2011-03-14 MED ORDER — SODIUM CHLORIDE 0.9 % IJ SOLN
3.0000 mL | INTRAMUSCULAR | Status: DC | PRN
  Administered 2011-03-14 – 2011-03-15 (×2): 3 mL via INTRAVENOUS

## 2011-03-14 MED ORDER — FENTANYL CITRATE 0.05 MG/ML IJ SOLN
INTRAMUSCULAR | Status: DC | PRN
  Administered 2011-03-14 (×5): 50 ug via INTRAVENOUS

## 2011-03-14 MED ORDER — SUCCINYLCHOLINE CHLORIDE 20 MG/ML IJ SOLN
INTRAMUSCULAR | Status: DC | PRN
  Administered 2011-03-14: 100 mg via INTRAVENOUS

## 2011-03-14 MED ORDER — HYDROCODONE-ACETAMINOPHEN 5-325 MG PO TABS: 1.0000 | ORAL_TABLET | Freq: Four times a day (QID) | ORAL | Status: DC | PRN

## 2011-03-14 MED ORDER — LACTATED RINGERS IV SOLN
INTRAVENOUS | Status: DC | PRN
  Administered 2011-03-14 (×2): via INTRAVENOUS

## 2011-03-14 MED ORDER — SODIUM CHLORIDE 0.9 % IR SOLN
Status: DC | PRN
  Administered 2011-03-14: 1000 mL

## 2011-03-14 MED ORDER — HYDROCODONE-ACETAMINOPHEN 5-325 MG PO TABS
ORAL_TABLET | ORAL | Status: AC
  Administered 2011-03-14: 1 via GASTROSTOMY
  Filled 2011-03-14: qty 1

## 2011-03-14 MED ORDER — LACTATED RINGERS IV SOLN
INTRAVENOUS | Status: DC
  Administered 2011-03-14: 500 mL via INTRAVENOUS
  Administered 2011-03-14: 1000 mL via INTRAVENOUS

## 2011-03-14 MED ORDER — ACETAMINOPHEN 325 MG PO TABS
650.0000 mg | ORAL_TABLET | ORAL | Status: DC | PRN
  Filled 2011-03-14: qty 2

## 2011-03-14 MED ORDER — SODIUM CHLORIDE 0.9 % IV SOLN: 250.0000 mL | INTRAVENOUS | Status: DC

## 2011-03-14 SURGICAL SUPPLY — 36 items
APL SKNCLS STERI-STRIP NONHPOA (GAUZE/BANDAGES/DRESSINGS) ×1
BANDAGE ADHESIVE 1X3 (GAUZE/BANDAGES/DRESSINGS) IMPLANT
BANDAID FLEXIBLE 1X3 (GAUZE/BANDAGES/DRESSINGS) ×2 IMPLANT
BENZOIN TINCTURE PRP APPL 2/3 (GAUZE/BANDAGES/DRESSINGS) ×2 IMPLANT
CLOSURE STERI STRIP 1/2 X4 (GAUZE/BANDAGES/DRESSINGS) ×2 IMPLANT
CLOTH BEACON ORANGE TIMEOUT ST (SAFETY) ×2 IMPLANT
COVER SURGICAL LIGHT HANDLE (MISCELLANEOUS) ×2 IMPLANT
DECANTER SPIKE VIAL GLASS SM (MISCELLANEOUS) IMPLANT
DEVICE SECURE STRAP 25 ABSORB (INSTRUMENTS) ×2 IMPLANT
DISSECT BALLN SPACEMKR + OVL (BALLOONS) ×4
DISSECTOR BALLN SPACEMKR + OVL (BALLOONS) ×2 IMPLANT
DISSECTOR BLUNT TIP ENDO 5MM (MISCELLANEOUS) IMPLANT
DRAPE LAPAROSCOPIC ABDOMINAL (DRAPES) ×2 IMPLANT
DRSG TEGADERM 4X4.75 (GAUZE/BANDAGES/DRESSINGS) ×2 IMPLANT
ELECT REM PT RETURN 9FT ADLT (ELECTROSURGICAL) ×2
ELECTRODE REM PT RTRN 9FT ADLT (ELECTROSURGICAL) ×1 IMPLANT
GLOVE BIOGEL PI IND STRL 7.0 (GLOVE) ×1 IMPLANT
GLOVE BIOGEL PI INDICATOR 7.0 (GLOVE) ×1
GLOVE SURG SIGNA 7.5 PF LTX (GLOVE) ×2 IMPLANT
GOWN STRL NON-REIN LRG LVL3 (GOWN DISPOSABLE) ×2 IMPLANT
GOWN STRL REIN XL XLG (GOWN DISPOSABLE) ×4 IMPLANT
KIT BASIN OR (CUSTOM PROCEDURE TRAY) ×2 IMPLANT
MESH ULTRAPRO 6X6 15CM15CM (Mesh General) ×4 IMPLANT
NEEDLE INSUFFLATION 14GA 120MM (NEEDLE) IMPLANT
PEN SKIN MARKING BROAD (MISCELLANEOUS) ×2 IMPLANT
SCISSORS LAP 5X35 DISP (ENDOMECHANICALS) IMPLANT
SET IRRIG TUBING LAPAROSCOPIC (IRRIGATION / IRRIGATOR) IMPLANT
SOLUTION ANTI FOG 6CC (MISCELLANEOUS) ×2 IMPLANT
STRIP CLOSURE SKIN 1/2X4 (GAUZE/BANDAGES/DRESSINGS) ×2 IMPLANT
SUT MNCRL AB 4-0 PS2 18 (SUTURE) ×2 IMPLANT
TACKER 5MM HERNIA 3.5CML NAB (ENDOMECHANICALS) IMPLANT
TOWEL OR 17X26 10 PK STRL BLUE (TOWEL DISPOSABLE) ×2 IMPLANT
TRAY FOLEY CATH 14FRSI W/METER (CATHETERS) ×2 IMPLANT
TRAY LAP CHOLE (CUSTOM PROCEDURE TRAY) ×2 IMPLANT
TROCAR CANNULA W/PORT DUAL 5MM (MISCELLANEOUS) ×4 IMPLANT
TUBING INSUFFLATION 10FT LAP (TUBING) ×2 IMPLANT

## 2011-03-14 NOTE — Op Note (Signed)
03/14/2011  2:39 PM  PATIENT:  Adam [name]  45 y.o. male  PRE-OPERATIVE DIAGNOSIS:  bilanteral hernias  POST-OPERATIVE DIAGNOSIS:  bilateral hernias  PROCEDURE:  Procedure(s): LAPAROSCOPIC BILATERAL INGUINAL HERNIA REPAIR  SURGEON:  Surgeon(s): Shelly Rubenstein, MD  PHYSICIAN ASSISTANT:   ASSISTANTS: none   ANESTHESIA:   local and general  EBL:   minimal BLOOD ADMINISTERED:none  DRAINS: none   LOCAL MEDICATIONS USED:  BUPIVICAINE 20CC  SPECIMEN:  No Specimen  DISPOSITION OF SPECIMEN:  N/A  COUNTS:  YES  TOURNIQUET:  * No tourniquets in log *  DICTATION: .Dragon Dictation The patient was brought to the operating room and identified as the correct patient. He was placed supine on the operating room table and general anesthesia was induced. His abdomen was prepped and draped in the usual sterile fashion. Using a #15 blade a small transverse incision was made at the lower edge of the umbilicus. This was carried down to the fascia which is then opened with scalpel. The rectus muscle was identified and elevated. A dissecting balloon was then passed underneath the rectus muscle manipulated to the pubis. The dissecting balloon was then insufflated under direct vision. The dissection of the preperitoneal space appeared to be achieved. The dissecting balloon was then removed and insufflation was begun with CO2. Next 25 mm ports placed patient's lower midline under direct vision. Dissection was then carried out in the right inguinal floor. The patient had a large direct inguinal hernia which was reduced. There was no evidence of indirect hernia. I then turned my attention toward the left groin. Again the testicle cord structure dissected out and again the patient had a direct hernia without evidence of indirect hernia. Next 2 separate pieces of 6" x 6" Prolene ultra pro mesh were brought onto the field. I placed both through the umbilical trocar and then opened him as an onlay over  the inguinal floor. Using the absorbable tacks, both pieces of mesh were then tacked to Cooper's ligament up the medial duodenal wall and then out laterally. Good coverage of both hernia defects and testicular cords appeared to be achieved. Hemostasis also appeared be achieved. The midline ports were removed and the preperitoneal space appeared to collapse appropriately. The port at the umbilicus and removed. I closed the fascia at the umbilicus with a figure-of-eight 0 Vicryl suture. All incisions were anesthetized with bupivacaine and then closed with 4-0 Monocryl subcuticular sutures. Steri-Strips and Band-Aids were applied. The patient tolerated well. All counts were correct at the end of the procedure. The patient was extubated in the operating room and taken in a stable condition to the recovery room.   PLAN OF CARE: Discharge to home after PACU  PATIENT DISPOSITION:  PACU - hemodynamically stable.   Delay start of Pharmacological VTE agent (>24hrs) due to surgical blood loss or risk of bleeding:  not applicable

## 2011-03-14 NOTE — Anesthesia Preprocedure Evaluation (Addendum)
Anesthesia Evaluation  Patient identified by MRN, date of birth, ID band Patient awake    Reviewed: Allergy & Precautions, H&P , NPO status , Patient's Chart, lab work & pertinent test results  Airway Mallampati: II TM Distance: >3 FB Neck ROM: Full    Dental No notable dental hx. (+) Teeth Intact   Pulmonary  clear to auscultation  Pulmonary exam normal       Cardiovascular hypertension, Pt. on home beta blockers neg cardio ROS - CAD Regular Normal    Neuro/Psych Negative Psych ROS   GI/Hepatic negative GI ROS, Neg liver ROS,   Endo/Other  Negative Endocrine ROS  Renal/GU negative Renal ROS  Genitourinary negative   Musculoskeletal negative musculoskeletal ROS (+)   Abdominal   Peds negative pediatric ROS (+)  Hematology negative hematology ROS (+)   Anesthesia Other Findings   Reproductive/Obstetrics negative OB ROS                          Anesthesia Physical Anesthesia Plan  ASA: II  Anesthesia Plan: General   Post-op Pain Management:    Induction: Intravenous  Airway Management Planned: Oral ETT  Additional Equipment:   Intra-op Plan:   Post-operative Plan: Extubation in OR  Informed Consent: I have reviewed the patients History and Physical, chart, labs and discussed the procedure including the risks, benefits and alternatives for the proposed anesthesia with the patient or authorized representative who has indicated his/her understanding and acceptance.     Plan Discussed with: CRNA  Anesthesia Plan Comments:         Anesthesia Quick Evaluation

## 2011-03-14 NOTE — H&P (Signed)
Adam [name]   02/26/2011 9:50 AM Office Visit  MRN: [id number]   Description: 45 year old male  Provider: Shelly Rubenstein, MD  Department: [facility]        Diagnoses     Bilateral inguinal hernia   - Primary    550.92      Reason for Visit     Other    Eval RIH        Vitals - Last Recorded       BP Pulse Temp(Src) Resp Ht Wt    128/82  64  96.9 F (36.1 C) (Temporal)  14  5\' 6"  (1.676 m)  155 lb 12.8 oz (70.67 kg)          BMI              25.15 kg/m2                 Progress Notes     Adam [name] A, MD  02/26/2011  9:46 AM  SignedChief Complaint   Patient presents with   .  Other       Eval RIH      HPI Adam [name] is [name] 45 y.o. male.   HPIThis is [name] pleasant gentleman referred by Dr. Fara [name] for evaluation of [name] possible right inguinal hernia. The patient been having pain for several weeks in his right groin. He has not noticed [name] bulge. The pain is described as sharp ring down to his testicle. He cannot relate any trauma or heavy lifting. The pain is intermittent. He has no tract symptoms. He is moving his bowels well and has no difficulty or    Past Medical History   Diagnosis  Date   .  Hypertension     .  Chest pain     .  Palpitations     .  Dizziness and giddiness     .  Esophageal reflux     .  Backache, unspecified     .  Pain in joint, pelvic region and thigh     .  Pain in joint, hand     .  Family history of stress     .  Major depressive disorder, single episode, unspecified     .  Abdominal  pain, other specified site     .  Prostatitis, unspecified     .  Panic disorder without agoraphobia     .  Anxiety state, unspecified     .  Unspecified cardiovascular disease     .  RBBB (right bundle branch block)     .  IBS (irritable bowel syndrome)     .  Hiatal hernia     .  Generalized headaches     .  Visual disturbance     .  Abdominal distention         Past Surgical History   Procedure  Date   .   Pyloric stenosis      .  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  Right arm         fracture--Right arm fracture s/p ORIF from roller blading.   .  Esophagogastroduodenoscopy         Family History   Problem  Relation  Age of Onset   .  Heart attack  Mother  73       PTCA  and 3  stents   .  Hodgkin's lymphoma  Maternal Grandmother        Social History History   Substance Use Topics   .  Smoking status:  Never Smoker    .  Smokeless tobacco:  Never Used   .  Alcohol Use:  No       Allergies   Allergen  Reactions   .  Hctz (Hydrochlorothiazide)     .  Penicillins         REACTION: Patient was told he was allergic but  reaction is not known.       Current Outpatient Prescriptions   Medication  Sig  Dispense  Refill   .  ALPRAZolam (XANAX) 0.5 MG tablet  Take 0.5 mg by mouth as needed.           [name]  aspirin 81 MG tablet  Take 81 mg by mouth daily.           .  cetirizine (ZYRTEC) 10 MG tablet  Take one nightly for allergies          .  clindamycin (CLEOCIN) 150 MG capsule  Take 3 capsules (450 mg total) by mouth 3 (three) times daily.   90 capsule   0   .  fluticasone (FLONASE) 50 MCG/ACT nasal spray  2 sprays by Nasal route daily.           [name]  HYDROcodone-acetaminophen (NORCO) 5-325 MG per tablet  Take 1 tablet by mouth every 6 (six) hours as needed for pain.   60 tablet   0   .  metoprolol (TOPROL-XL) 50 MG 24 hr tablet  Take 0.5 tablets (25 mg total) by mouth daily.   30 tablet   0   .  omeprazole (PRILOSEC) 40 MG capsule  Take 1 capsule (40 mg total) by mouth daily.   30 capsule   11   .  Psyllium 100 % PACK  Take 1 packet by mouth 3 (three) times daily with meals.   100 g   11   .  ranitidine (ZANTAC) 300 MG tablet  Take 1 tablet (300 mg total) by mouth at bedtime as needed for heartburn.   30 tablet   6      Review of Systems Review of Systems  Constitutional: Negative for fever, chills and unexpected weight change.  HENT: Negative  for hearing loss, congestion, sore throat, trouble swallowing and voice change.   Eyes: Negative for visual disturbance.  Respiratory: Negative for cough and wheezing.   Cardiovascular: Negative for chest pain, palpitations and leg swelling.  Gastrointestinal: Negative for nausea, vomiting, abdominal pain, diarrhea, constipation, blood in stool, abdominal distention, anal bleeding and rectal pain.  Genitourinary: Negative for hematuria and difficulty urinating.  Musculoskeletal: Negative for arthralgias.  Skin: Negative for rash and wound.  Neurological: Negative for seizures, syncope, weakness and headaches.  Hematological: Negative for adenopathy. Does not bruise/bleed easily.  Psychiatric/Behavioral: Negative for confusion.    Blood pressure 128/82, pulse 64, temperature 96.9 F (36.1 C), temperature source Temporal, resp. rate 14, height 5\' 6"  (1.676 m), weight 155 lb 12.8 oz (70.67 kg).   Physical Exam Physical Exam  Constitutional: He is oriented to person, place, and time. He appears well-developed and well-nourished. No distress.  HENT:   Head: Normocephalic and atraumatic.  Right Ear: External ear normal.  Left Ear: External ear normal.   Nose: Nose normal.   Mouth/Throat: Oropharynx is clear and moist. No oropharyngeal exudate.  Eyes: Conjunctivae  are normal. Pupils are equal, round, and reactive to light. Scleral icterus is present.  Neck: Normal range of motion. No thyromegaly present.  Cardiovascular: Normal rate, regular rhythm, normal heart sounds and intact distal pulses.    No murmur heard. Pulmonary/Chest: Effort normal and breath sounds normal. No respiratory distress. He has no wheezes.  Abdominal: Soft. Bowel sounds are normal. He exhibits no distension. There is no tenderness. There is no rebound.       He has [name] well-healed incision in his left upper quadrant. There is no umbilical hernia. There are bilateral reducible inguinal hernias. The left is greater in  size than the right  Musculoskeletal: He exhibits no edema and no tenderness.  Lymphadenopathy:    He has no cervical adenopathy.  Neurological: He is alert and oriented to person, place, and time.  Skin: Skin is warm and dry. No rash noted. No erythema.  Psychiatric: His behavior is normal. Judgment normal.    Data Reviewed     Assessment Patient with bilateral inguinal hernias   Plan   I discussed repair with him. I discussed both laparoscopic and open techniques. I would recommend [name] laparoscopic technique as is his bilateral. I also discussed the use of mesh. I discussed the risk of surgery which includes but is not limited to bleeding, infection, use of mesh, recurrence, injury to surrounding structures, nerve entrapment, chronic pain, etc. Likelihood of [name] good outcome as expected. I also discussed postoperative recovery. Surgery will be scheduled       Adam [name] 02/26/2011, 9:41 AM                Not recorded              Level of Service     PR OFFICE CONSULTATION,LEVEL III [82956]      Follow-up and Disposition     Return for schedule for surgery.       Routing History Recorded        All Flowsheet Templates (all recorded)     Encounter Vitals Flowsheet    Custom Formula Data Flowsheet    Anthropometrics Flowsheet               Referring Provider          Jacquelyn McGill       All Charges for This Encounter       Code Description Service Date Service Provider Modifiers Quantity    [phone] PR OFFICE CONSULTATION,LEVEL III 02/26/2011 Adam Rubenstein, MD   1        Other Encounter Related Information     Allergies & Medications         Problem List         History         Patient-Entered Questionnaires     No data filed

## 2011-03-14 NOTE — Interval H&P Note (Signed)
History and Physical Interval Note:   03/14/2011   11:24 AM   Adam [name]  has presented today for surgery, with the diagnosis of bilanteral hernias  The various methods of treatment have been discussed with the patient and family. After consideration of risks, benefits and other options for treatment, the patient has consented to  Procedure(s): LAPAROSCOPIC BILATERAL INGUINAL HERNIA REPAIR as a surgical intervention .  The patients' history has been reviewed, patient examined, no change in status, stable for surgery.  I have reviewed the patients' chart and labs.  Questions were answered to the patient's satisfaction.     Abigail Miyamoto A  MD

## 2011-03-14 NOTE — Anesthesia Postprocedure Evaluation (Signed)
  Anesthesia Post-op Note  Patient: Adam [name]  Procedure(s) Performed:  LAPAROSCOPIC BILATERAL INGUINAL HERNIA REPAIR - with mesh  Patient Location: PACU  Anesthesia Type: General  Level of Consciousness: awake and alert   Airway and Oxygen Therapy: Patient Spontanous Breathing  Post-op Pain: mild  Post-op Assessment: Post-op Vital signs reviewed, Patient's Cardiovascular Status Stable, Respiratory Function Stable, Patent Airway and No signs of Nausea or vomiting  Post-op Vital Signs: stable  Complications: No apparent anesthesia complications

## 2011-03-14 NOTE — Transfer of Care (Signed)
Immediate Anesthesia Transfer of Care Note  Patient: Adam [name]  Procedure(s) Performed:  LAPAROSCOPIC BILATERAL INGUINAL HERNIA REPAIR - with mesh  Patient Location: PACU  Anesthesia Type: General  Level of Consciousness: awake, oriented and patient cooperative  Airway & Oxygen Therapy: Patient Spontanous Breathing and Patient connected to face mask oxygen  Post-op Assessment: Report given to PACU RN and Post -op Vital signs reviewed and stable  Post vital signs: Reviewed and stable  Complications: No apparent anesthesia complications

## 2011-03-14 NOTE — Progress Notes (Signed)
Dr Daphine Deutscher paged at pt request pt given iv tordal and xanax.  Pain unrelieved rates pain at 10.  Pt refusing to take any more pain meds.  States that this something is wrong and he needs someone to come see him as he feels his right lower abd from incision to groin is hard and swollen.  Nurse assessed and found the area to slightly firm but no increased swelling or firmness as pt states.  Discussed with DrMartin pt was afebrile not tachycardic bp slightly elevated.  Discussed pain level and pt refusal to continue with iv pain meds.  Pt Urinary output of 300 cc and dk in color reported to MD.  Order received to give pt IV Tylenol.

## 2011-03-14 NOTE — Progress Notes (Signed)
In to see pt after talking with Dr.Martin about pt pain and concerns.  Discussed orders with patient.  Patient states he did not want to have iv Tylenol and would not take med.  Pt encouraged to try some form of pain med all options pt has discussed with pt and wife.  Pt refuses all pain meds although he did let nurse to refill ice pack.  Discussed we will readdress issues as needed ie unable to void, tachycardia, or fever.  Pt states understanding.  Family at bedside

## 2011-03-15 ENCOUNTER — Ambulatory Visit (HOSPITAL_COMMUNITY): Payer: Self-pay

## 2011-03-15 MED ORDER — OXYCODONE-ACETAMINOPHEN 10-325 MG PO TABS: 1.0000 | ORAL_TABLET | ORAL | Status: AC | PRN

## 2011-03-15 MED ORDER — IOHEXOL 300 MG/ML  SOLN
100.0000 mL | Freq: Once | INTRAMUSCULAR | Status: AC | PRN
  Administered 2011-03-15: 100 mL via INTRAVENOUS

## 2011-03-15 NOTE — Progress Notes (Signed)
  Adam [name] 45 y.o.  Body mass index is 25.02 kg/(m^2).  Patient Active Problem List  Diagnoses  . DEPRESSION, MAJOR  . ANXIETY  . PANIC ATTACKS  . UNSPECIFIED CONDITION OF BRAIN  . UNSPECIFIED VISUAL DISTURBANCE  . ESSENTIAL HYPERTENSION, BENIGN  . ALLERGIC RHINITIS, SEASONAL  . GASTROESOPHAGEAL REFLUX, NO ESOPHAGITIS  . PROSTATITIS NOS  . LOW BACK PAIN, CHRONIC  . PALPITATIONS  . CHEST PAIN  . HYPOKALEMIA, HX OF  . BACK PAIN, CHRONIC  . Shoulder pain, bilateral  . Orthopnea  . Abdominal pain  . Fatigue  . Shortness of breath  . Dental abscess  . Bilateral inguinal hernia  . Inguinal hernia, bilateral    Allergies  Allergen Reactions  . Hctz (Hydrochlorothiazide) Other (See Comments)    Unknown reaction per patient  . Penicillins     REACTION: Patient was told he was allergic but  reaction is not known.    Past Surgical History  Procedure Date  . Pyloric stenosis    . 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 Right arm     fracture--Right arm fracture s/p ORIF from roller blading.  . Esophagogastroduodenoscopy   . Fracture surgery 2000    left arm   MCGILL,JACQUELYN, MD No diagnosis found.  Patient seen and examined.  He is having disproportionate pain to his physical findings or vitals.  His abdomen is flat.  There is no ecchymosis and the penis is not swollen.  Patient complains that he cannot lift his leg for the pain and that pain is running down the leg.  It doesn't seem to be lateral pain or burning pain.  Discussed with Dr. Magnus Ivan and case went very well.  No anticipated complications  He is not able to go home at present.  Will get CBC and pelvic CT  Matt B. Daphine Deutscher, MD, [facility] [phone] beeper [phone]  03/15/2011 8:48 AM

## 2011-03-15 NOTE — Discharge Summary (Signed)
Patient admitted for pain management.  Now better.  Ct. Scan negative

## 2011-03-16 ENCOUNTER — Ambulatory Visit: Payer: Self-pay | Admitting: Cardiology

## 2011-03-19 ENCOUNTER — Encounter (HOSPITAL_COMMUNITY): Payer: Self-pay | Admitting: Surgery

## 2011-03-19 ENCOUNTER — Encounter (INDEPENDENT_AMBULATORY_CARE_PROVIDER_SITE_OTHER): Payer: Self-pay | Admitting: Surgery

## 2011-03-20 NOTE — Discharge Summary (Signed)
NAME:  Adam [name], Adam [name] NO.:  [id number]  MEDICAL RECORD NO.:  [id number]  LOCATION:  [id number]                         FACILITY:  [facility]  PHYSICIAN:  Abigail Miyamoto, M.D. DATE OF BIRTH:  [birth year]  DATE OF ADMISSION:  03/14/2011 DATE OF DISCHARGE:  03/15/2011                              DISCHARGE SUMMARY   HISTORY:  This is a 45 year old gentleman who was admitted for elective laparoscopic bilateral inguinal hernia repair with mesh.  There was plan for him to go home the day of surgery, however, secondary to discomfort and it being late in the day he was admitted to the hospital.  The next morning, he was doing well.  His pain was moderately well controlled. Dr. Luretha Murphy saw him for me and ordered a CAT scan of the abdomen and pelvis which was unremarkable.  By the time I saw him, he was almost pain-free and a decision was made to discharge the patient home.  DISCHARGE DIET:  Regular.  DISCHARGE ACTIVITY:  He is to do no heavy lifting greater than 20 pounds until he sees [place] back in the office.  DISCHARGE FOLLOWUP:  He will follow up in my office in 1 week post discharge.     Abigail Miyamoto, M.D.     DB/MEDQ  D:  03/19/2011  T:  03/20/2011  Job:  [id number]

## 2011-03-22 ENCOUNTER — Other Ambulatory Visit (INDEPENDENT_AMBULATORY_CARE_PROVIDER_SITE_OTHER): Payer: Self-pay | Admitting: Surgery

## 2011-03-22 ENCOUNTER — Telehealth (INDEPENDENT_AMBULATORY_CARE_PROVIDER_SITE_OTHER): Payer: Self-pay

## 2011-03-22 DIAGNOSIS — K4020 Bilateral inguinal hernia, without obstruction or gangrene, not specified as recurrent: Secondary | ICD-10-CM

## 2011-03-22 MED ORDER — HYDROCODONE-ACETAMINOPHEN 5-325 MG PO TABS: ORAL_TABLET | ORAL | Status: DC

## 2011-03-22 NOTE — Telephone Encounter (Signed)
Called protocol Vicodin in to [facility] [address]  Pt is aware.

## 2011-03-28 ENCOUNTER — Ambulatory Visit (INDEPENDENT_AMBULATORY_CARE_PROVIDER_SITE_OTHER): Payer: Self-pay | Admitting: Psychology

## 2011-03-28 ENCOUNTER — Encounter: Payer: Self-pay | Admitting: Psychology

## 2011-03-28 DIAGNOSIS — F39 Unspecified mood [affective] disorder: Secondary | ICD-10-CM

## 2011-03-28 NOTE — Assessment & Plan Note (Signed)
Report of mood is anxious and irritable.  Affect today was within normal limits.  Speech was normal in rate and rhythm.  Thoughts were clear and goal directed.  Attention and concentration seemed within normal limits.  Did not appear to be responding to internal stimuli.  Denied auditory and visual hallucinations.  No evidence of delusions.  Denied suicidal ideation.  Insight appears above average.  Age of onset of mood disturbance was childhood.  Family history for mental health issues is strong.  Frequency of symptoms seems high with intensity lessening over the years Sarver thinks secondary to age).  There is impairment in family function.  Did not assess work function.  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  While Elim thinks the anxiety is primary, the treatment team believes that mood (irritability / anger) is the root of the issue.  If this is adequately addressed, the anxiety should dissipate.  The treatment team was cognizant of his experience with the psychiatrist where he felt overwhelmed with the number of diagnoses and recommended medications.  We adopted a go-slow approach where Markeis was encouraged to think about the information we gave him today and consider how important it is for him to get treatment.  We encouraged him to think not only about pharmacology but therapy as well.  Family therapy (using a systems approach) would likely yield the best outcome.  I told him I would look into resources for this.  His family system is large and complex.  Financial / insurance resources are limited.  Individual therapy would be useful as well.  Tatsuya exhibited ambivalence about this.  His insight and work that he has done already (seemingly on his own) were highlighted as strengths.    See patient instructions for further plan.

## 2011-03-28 NOTE — Patient Instructions (Signed)
Please schedule a follow-up for: December 19th at 10:00. Dr. Kathrynn Running and Pascal Lux discussed that you would probably benefit from a medication to manage your mood better (give you better emotional control).   We also think therapy would be a wonderful part of your treatment.  You have already done a lot of work on your own and could benefit from some help. We heard you say that anxiety is the thing that bothers you most.  That is understandable - especially given the stress you have.  We do not think this is the main problem.  By treating the main problem (your mood), we think your anxiety will get better. You need to call [phone] if you need to cancel.
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
## 2011-04-02 ENCOUNTER — Ambulatory Visit (INDEPENDENT_AMBULATORY_CARE_PROVIDER_SITE_OTHER): Payer: Self-pay | Admitting: Surgery

## 2011-04-02 ENCOUNTER — Encounter (INDEPENDENT_AMBULATORY_CARE_PROVIDER_SITE_OTHER): Payer: Self-pay | Admitting: Surgery

## 2011-04-02 VITALS — BP 122/86 | HR 72 | Temp 97.2°F | Resp 18 | Ht 66.0 in | Wt 154.2 lb

## 2011-04-02 DIAGNOSIS — Z09 Encounter for follow-up examination after completed treatment for conditions other than malignant neoplasm: Secondary | ICD-10-CM

## 2011-04-02 NOTE — Progress Notes (Signed)
Subjective:     Patient ID: Adam [name], male   DOB: December 07, 1965, 45 y.o.   MRN: [id number]  HPI  He is here for his first postoperative visit status post bilateral laparoscopic inguinal hernia repair with mesh. He is still having discomfort on the right side but much less on the left. He has no other complaints. Review of Systems     Objective:   Physical Exam On exam, his incisions are well healed and there is no evidence of recurrence    Assessment:     Patient status post bilateral laparoscopic inguinal hernia repair with mesh.    Plan:     He may now resume normal activity. Because of his discomfort, I will see him back in one month.

## 2011-04-03 ENCOUNTER — Telehealth (INDEPENDENT_AMBULATORY_CARE_PROVIDER_SITE_OTHER): Payer: Self-pay | Admitting: General Surgery

## 2011-04-03 NOTE — Telephone Encounter (Signed)
The patient is requesting a refill on his vicodin, has allready had a protocol refill. States he is still having pain into his right testicle.

## 2011-04-25 ENCOUNTER — Ambulatory Visit (INDEPENDENT_AMBULATORY_CARE_PROVIDER_SITE_OTHER): Payer: Self-pay | Admitting: Psychology

## 2011-04-25 DIAGNOSIS — F39 Unspecified mood [affective] disorder: Secondary | ICD-10-CM

## 2011-04-25 NOTE — Assessment & Plan Note (Signed)
Izeah does not appear to be struggling with his mood today.  Affect is within normal limits.  He is talking positively about his family.  He is very talkative and not very reflective.  Utilized some MI techniques around therapy (medication was a closed door).  He accepted referrals for family therapy and stated that he thought this could be helpful.  Finances may be a barrier.  Gave him [employer] [facility] the [place], [address] [facility] and the [facility] psychology clinic.  Recommended Family Services first.    Wojciech does not need to be seen back in [facility] unless he is interested in taking a medication to help manage his mood.  Interestingly, he says he is not willing to take medicine for mood but he is willing to take an anxiolytic according to his last report.  Ideally, he would treat his mood issue first and the anxiety would attenuate.

## 2011-04-25 NOTE — Progress Notes (Signed)
Leshawn presents for follow-up.  He does not remember our plan from our initial visit.  Reminded him that he was going to think about his mood and anxiety and return to discuss treatment options such as medication and therapy.  He said he has no interest in medicine and he expressed a fair amount of ambivalence about therapy.  He thinks it might be helpful but there is the issue about feeling judged as well as other things.

## 2011-05-02 ENCOUNTER — Ambulatory Visit: Payer: Self-pay | Admitting: Family Medicine

## 2011-05-03 ENCOUNTER — Ambulatory Visit: Payer: Self-pay | Admitting: Family Medicine

## 2011-05-07 ENCOUNTER — Encounter (INDEPENDENT_AMBULATORY_CARE_PROVIDER_SITE_OTHER): Payer: Self-pay | Admitting: Surgery

## 2011-05-09 ENCOUNTER — Encounter (HOSPITAL_COMMUNITY): Payer: Self-pay | Admitting: Emergency Medicine

## 2011-05-09 ENCOUNTER — Emergency Department (INDEPENDENT_AMBULATORY_CARE_PROVIDER_SITE_OTHER)
Admission: EM | Admit: 2011-05-09 | Discharge: 2011-05-09 | Disposition: A | Discharge status: Home / Self Care | Payer: Self-pay | Source: Home / Self Care | Attending: Family Medicine | Admitting: Family Medicine

## 2011-05-09 DIAGNOSIS — K219 Gastro-esophageal reflux disease without esophagitis: Secondary | ICD-10-CM

## 2011-05-09 MED ORDER — RANITIDINE HCL 150 MG PO TABS: 150.0000 mg | ORAL_TABLET | Freq: Two times a day (BID) | ORAL | Status: DC

## 2011-05-09 NOTE — ED Notes (Signed)
Sharp, stabbing pain in center of epigastric area.  No bowel movement in a week, reports this is normal.  Feels like food gets stuck in esophagus.  Complaints of nausea.

## 2011-05-09 NOTE — ED Provider Notes (Signed)
History     CSN: [id number]  Arrival date & time 05/09/11  1522   First MD Initiated Contact with Patient 05/09/11 1620      Chief Complaint  Patient presents with  . Abdominal Pain    (Consider location/radiation/quality/duration/timing/severity/associated sxs/prior treatment) HPI Comments: Adam [name] presents for evaluation of abdominal pain worsening over the last day. He reports a long hx of chronic abdominal problems including irritable bowel syndrome diagnosed by GI several years ago; GERD (untreated). He denies any vomiting or diarrhea. He is eating and drinking well but reports long hx of early satiety.  Patient is a 46 y.o. male presenting with abdominal pain.  Abdominal Pain The primary symptoms of the illness include abdominal pain. The primary symptoms of the illness do not include fever, nausea, vomiting or diarrhea. The current episode started yesterday. The onset of the illness was sudden.  The patient has not had a change in bowel habit. Additional symptoms associated with the illness include heartburn. Symptoms associated with the illness do not include chills.    Past Medical History  Diagnosis Date  . Chest pain   . Palpitations   . Dizziness and giddiness   . Backache, unspecified   . Pain in joint, pelvic region and thigh   . Pain in joint, hand   . Family history of stress   . Abdominal  pain, other specified site   . Prostatitis, unspecified   . Anxiety state, unspecified   . Unspecified cardiovascular disease   . IBS (irritable bowel syndrome)   . Visual disturbance   . Abdominal distention   . Esophageal reflux     no meds. per pt.  . Generalized headaches   . Major depressive disorder, single episode, unspecified   . Panic disorder without agoraphobia   . Hypertension   . Coronary artery disease     pt. states Right Bundle Branch Block  . RBBB (right bundle branch block)   . Hiatal hernia     pt. denies nerve/mucscle disease    Past Surgical  History  Procedure Date  . Pyloric stenosis    . 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 Right arm     fracture--Right arm fracture s/p ORIF from roller blading.  . Esophagogastroduodenoscopy   . Fracture surgery 2000    left arm  . Inguinal hernia repair 03/14/2011    Procedure: LAPAROSCOPIC BILATERAL INGUINAL HERNIA REPAIR;  Surgeon: Shelly Rubenstein, MD;  Location: [facility];  Service: General;  Laterality: N/A;  with mesh  . Hernia repair 03/14/2011    BIH    Family History  Problem Relation Age of Onset  . Heart attack Mother 55    PTCA  and 3 stents  . Heart disease Mother   . Hodgkin's lymphoma Maternal Grandmother   . Cancer Maternal Grandmother     hodgkin  . Cancer Maternal Aunt     pt unaware of what kind  . Cancer Maternal Uncle     pt unaware of what kind    History  Substance Use Topics  . Smoking status: Never Smoker   . Smokeless tobacco: Never Used  . Alcohol Use: No      Review of Systems  Constitutional: Negative for fever and chills.  Eyes: Negative.   Respiratory: Negative.   Cardiovascular: Negative.   Gastrointestinal: Positive for heartburn and abdominal pain. Negative for nausea, vomiting and diarrhea.  Genitourinary: Negative.   Musculoskeletal: Negative.  Skin: Negative.   Neurological: Negative.     Allergies  Hctz and Penicillins  Home Medications   Current Outpatient Rx  Name Route Sig Dispense Refill  . ALPRAZOLAM 0.5 MG PO TABS Oral Take 0.5 mg by mouth 2 (two) times daily as needed. anxiety    . ASPIRIN 81 MG PO TABS Oral Take 81 mg by mouth daily.     [name] CETIRIZINE HCL 10 MG PO TABS Oral Take 10 mg by mouth daily as needed. for allergies    . FLUTICASONE PROPIONATE 50 MCG/ACT NA SUSP Nasal Place 2 sprays into the nose daily.     [name] HYDROCODONE-ACETAMINOPHEN 5-325 MG PO TABS  Take 1-2 tablets every 4-6 hours as needed for pain. 30 tablet 0  . METOPROLOL SUCCINATE ER 50 MG PO TB24 Oral Take 25 mg by  mouth every morning.      Carma Leaven M PLUS PO TABS Oral Take 1 tablet by mouth every 7 (seven) days.      . PSYLLIUM 100 % PO PACK Oral Take 1 packet by mouth 3 (three) times daily with meals. 100 g 11  . RANITIDINE HCL 150 MG PO TABS Oral Take 1 tablet (150 mg total) by mouth 2 (two) times daily. 30 tablet 0    BP 124/80  Pulse 71  Temp(Src) 98.7 F (37.1 C) (Oral)  Resp 16  SpO2 99%  Physical Exam  Nursing note and vitals reviewed. Constitutional: He is oriented to person, place, and time. He appears well-developed and well-nourished.  HENT:  Head: Normocephalic and atraumatic.  Eyes: EOM are normal.  Neck: Normal range of motion.  Pulmonary/Chest: Effort normal.  Abdominal: Soft. Normal appearance and bowel sounds are normal. There is tenderness in the epigastric area, periumbilical area and left lower quadrant. There is no rigidity, no rebound, no guarding and negative Murphy's sign. Hernia confirmed negative in the right inguinal area and confirmed negative in the left inguinal area.  Genitourinary: Testes normal and penis normal. Circumcised.  Musculoskeletal: Normal range of motion.  Neurological: He is alert and oriented to person, place, and time.  Skin: Skin is warm and dry.  Psychiatric: His behavior is normal.    ED Course  Procedures (including critical care time)  Labs Reviewed - No data to display No results found.   1. GERD (gastroesophageal reflux disease)       MDM  Follow up with Family Practice on Monday as scheduled. Take medication (ranitidine) as directed.         Richardo Priest, MD 05/09/11 [phone]

## 2011-05-14 ENCOUNTER — Encounter: Payer: Self-pay | Admitting: Family Medicine

## 2011-05-14 ENCOUNTER — Ambulatory Visit (INDEPENDENT_AMBULATORY_CARE_PROVIDER_SITE_OTHER): Payer: Self-pay | Admitting: Family Medicine

## 2011-05-14 ENCOUNTER — Encounter (INDEPENDENT_AMBULATORY_CARE_PROVIDER_SITE_OTHER): Payer: Self-pay | Admitting: Surgery

## 2011-05-14 VITALS — BP 127/87 | HR 78 | Temp 98.7°F | Ht 66.0 in | Wt 151.0 lb

## 2011-05-14 DIAGNOSIS — H9190 Unspecified hearing loss, unspecified ear: Secondary | ICD-10-CM

## 2011-05-14 DIAGNOSIS — H9209 Otalgia, unspecified ear: Secondary | ICD-10-CM

## 2011-05-14 DIAGNOSIS — R519 Headache, unspecified: Secondary | ICD-10-CM | POA: Insufficient documentation

## 2011-05-14 DIAGNOSIS — H9202 Otalgia, left ear: Secondary | ICD-10-CM

## 2011-05-14 DIAGNOSIS — R51 Headache: Secondary | ICD-10-CM

## 2011-05-14 DIAGNOSIS — R42 Dizziness and giddiness: Secondary | ICD-10-CM

## 2011-05-14 DIAGNOSIS — I10 Essential (primary) hypertension: Secondary | ICD-10-CM

## 2011-05-14 MED ORDER — MECLIZINE HCL 25 MG PO TABS: 25.0000 mg | ORAL_TABLET | Freq: Three times a day (TID) | ORAL | Status: AC | PRN

## 2011-05-14 NOTE — Assessment & Plan Note (Signed)
Unclear etiology at this time. No red flags on history or exam. Unlikely to be neurological in nature given normal physical exam. Could consider diagnosis such as cluster headache versus tension headache versus atypical migraine without aura. Will continue to monitor these. Right-sided headache could also be been caused by need for tooth extraction, which will occur 06/20/11. Will have patient followup after dental procedure to see if this has given him any relief.

## 2011-05-14 NOTE — Assessment & Plan Note (Signed)
Blood pressure at goal. Continue current treatment.  BP Readings from Last 3 Encounters:  05/14/11 127/87  05/09/11 124/80  04/02/11 122/86

## 2011-05-14 NOTE — Assessment & Plan Note (Addendum)
With patient's history, question orthostatic hypotension. However, orthostatic vital signs within normal limits while in clinic. Will try meclizine, although with description this is less likely to be vertigo. No red flags. No focal neurological symptoms. Continue to monitor. If this continues to be an issue, could try decreasing Toprol to 25 mg.

## 2011-05-14 NOTE — Assessment & Plan Note (Signed)
Left and right TM normal on physical exam. Small hemostatic abrasion in left ear canal from patient using bobby pin to clean ears. Discouraged patient from doing this. Encouraged him to put nothing in his ear. Causes of ear pain could include TMJ versus dental causes versus other. Patient has followup visit with dentist in one month. We will see if  dental work helps with your pain.

## 2011-05-14 NOTE — Progress Notes (Signed)
S: Pt comes in today for multiple complaints.  LEFT EAR PAIN Has been ongoing for months.  Described as a dull/achy pain that is "deep down."  He has not noticed any drainage, no fever or chills, no nausea or vomiting.  There is associated dizziness, which is described more as lightheadedness rather than vertigo or the room spinning. Nothing seems to make it better or worse. He has tried cleaning his ears out with peroxide and a bobby-pin, neither of which have helped.  He feels like his hearing is not as good out of his left ear and occasionally hears "bubbling."  DIZZINESS This has been occurring for longer than that your pain. He describes the dizziness proceed headache (see below for more detail). He reports dizziness mostly with standing or moving his head too quickly. He states that the only thing that makes his dizziness better is time. He does not seem to think that it is helped with lying down. He describes it more as a lightheadedness rather than a vertigo with the room spinning. He has not had any syncopal episodes but reports feeling presyncopal. He states that these presyncopal episodes or better with sitting. He has not had any falls, head trauma. He has not noticed any numbness, tingling, or weakness.  HEADACHE Patient states that he has a constant "light" headache on the right side of his head, which he describes as achy. This has been going on for months to a year. However, over the past 2-3 weeks, he has had a worsening headache on the right side and back of his head. He describes this pain as a stabbing and sharp pain. He states it has also started to be a stabbing pain in the back of his eyes. He reports that his headaches get better on their own. He's tried aspirin, Motrin, and Tylenol, none of which help. He states that when he has his headache, it is made worse with bright lights. However, bright lights do not seem to bring them on. He reports no phonophobia. He is no associated  nausea or vomiting. He often has associated dizziness. He has not noticed any vision changes. He does not seem to have an aura prior to the headache starting.   HYPERTENSION BP: 137/93 Meds: Toprol XL 50mg  Taking meds: Yes     # of doses missed/week: 2 Symptoms: Headache: Yes : see below Dizziness: Yes : see below Vision changes: No SOB:  No Chest pain: No LE swelling: No Tobacco use: No   ROS: Per HPI  History  Smoking status  . Never Smoker   Smokeless tobacco  . Never Used    O:  Filed Vitals:   05/14/11 1347  BP: 137/93  Pulse: 70  Temp: 98.7 F (37.1 C)    Gen: NAD HEENT: MMM, no pharyngeal erythema or exudate, TMS nml bilaterally, small hemostatic abrasion in left ear canal, no papilledema bilaterally, no cervical LAD CV: RRR, no murmur Pulm: CTA bilat, no wheezes or crackles Ext: Warm, no chronic skin changes, no edema Neuro: Alert and oriented, appropriate, CN 2-12 intact, fundoscopic exam nml   Hearing test: normal Orthostatic VS: WNL   A/P: 46 y.o. male p/w ear pain, dizziness, headache -See problem list -f/u in 2 months

## 2011-05-14 NOTE — Patient Instructions (Addendum)
It was good to see you today. For your ear pain, dizziness, and headaches, we will try a medicine called meclizine.  I am also giving you some information on TMJ.  Try to do things like avoiding gum or frequent chewing and see if this doesn't help the ear pain.  If decreasing chewing seems to help, there are many over the counter mouth guards you can use if you grind your teeth at night. Your ear pain could be caused by dental problems, so we'll see it gets any better after you see the dentist.  Come back and see me in the next few months to see how things are going.

## 2011-06-06 ENCOUNTER — Ambulatory Visit: Payer: Self-pay | Admitting: Family Medicine

## 2011-06-08 ENCOUNTER — Other Ambulatory Visit: Payer: Self-pay | Admitting: Family Medicine

## 2011-06-18 ENCOUNTER — Ambulatory Visit: Payer: Self-pay | Admitting: Family Medicine

## 2011-06-19 ENCOUNTER — Encounter: Payer: Self-pay | Admitting: Internal Medicine

## 2011-06-19 ENCOUNTER — Ambulatory Visit (INDEPENDENT_AMBULATORY_CARE_PROVIDER_SITE_OTHER): Payer: Self-pay | Admitting: Internal Medicine

## 2011-06-19 VITALS — BP 118/70 | HR 67 | Ht 66.0 in | Wt 154.0 lb

## 2011-06-19 DIAGNOSIS — K589 Irritable bowel syndrome without diarrhea: Secondary | ICD-10-CM
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
## 2011-06-19 NOTE — Assessment & Plan Note (Signed)
Recurrent problems he is describing are consistent with symptoms she has had over the years. I'm going to have him try an increased dose of MiraLax twice a day, with intermittent Dulcolax one to 2. Hopefully with better defecation pattern things will improve though I suspect he will not have complete resolution of his symptoms. If this is unsuccessful he can try and anti-spasmodic perhaps, something like dicyclomine, and consider a probiotic or perhaps even a round of antibiotics though it would probably be difficult to get him something like Xifaxan to treat gas bloat given that he has no insurance or indication coverage. I reassured him that there is nothing serious wrong with respect to his health as best I can tell. I do not think further endoscopic evaluation is indicated at this time nor do I think laboratory as testing is indicated for these chronic recurrent symptoms consistent with IBS.

## 2011-06-19 NOTE — Patient Instructions (Signed)
Please purchase Miralax over-the-counter and take 17 grams twice a day in 6-8 ounces of liquid. Purchase Dulcolax over-the-counter and take 1-2 tablets as needed, if you are not having a bowel movement about every other day then you may add in the Dulcolax as needed. You have been given a Irritable Bowel Syndrome handout to read and follow.

## 2011-06-27 ENCOUNTER — Ambulatory Visit (INDEPENDENT_AMBULATORY_CARE_PROVIDER_SITE_OTHER): Payer: Self-pay | Admitting: Family Medicine

## 2011-06-27 ENCOUNTER — Encounter: Payer: Self-pay | Admitting: Family Medicine

## 2011-06-27 VITALS — BP 138/84 | HR 99 | Ht 66.0 in | Wt 155.0 lb

## 2011-06-27 DIAGNOSIS — K589 Irritable bowel syndrome without diarrhea: Secondary | ICD-10-CM

## 2011-06-27 DIAGNOSIS — F39 Unspecified mood [affective] disorder: Secondary | ICD-10-CM

## 2011-06-27 DIAGNOSIS — R42 Dizziness and giddiness: Secondary | ICD-10-CM

## 2011-06-27 NOTE — Assessment & Plan Note (Signed)
Likely contributing to patient's dizziness/ "out of body" feeling.  Rec'ed pt see Dr. Pascal Lux for CBT, or at least help with managing his body's reaction to stress (breathing techniques during episodes, etc).

## 2011-06-27 NOTE — Assessment & Plan Note (Signed)
Things that I do NOT think this is include seizure, stroke/TIA, brain tumor/bleed, orthostasis, vertigo.  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  Encouraged pt to see Dr. Pascal Lux for CBT.  Will have pt come in frequently to check on the status of his symptoms and continue reassuring him.  Could consider adding SSRI in the future to help treat likely underlying anxiety and/or depression, but pt would like to wait on this since he does not like taking medications every day.  Will d/c meclizine since it is not helping.

## 2011-06-27 NOTE — Patient Instructions (Addendum)
It was good to see you today. The good news is, I do not think that your dizziness and other symptoms are anything bad or dangerous.  However, they are annoying. I recommend that you meet with our psychologist, Dr. Spero Geralds, for help dealing with your body's reaction to stress.  You can schedule an appointment with her by calling her directly at [phone]. We do have the option of starting a medicine to help deal with your underlying anxiety, which may be contributing to this problem.  I am ok with waiting to start a medicine, since you would prefer not to at this time.   We have decided that you should come back to see me again in 2-4 weeks.

## 2011-06-27 NOTE — Assessment & Plan Note (Signed)
No doing what GI rec'ed, but is not complaining about this at this time.  No changes right now.

## 2011-06-27 NOTE — Progress Notes (Signed)
S: Pt comes in today for follow up.  ABDOMINAL PAIN Seen by GI on 2/12, diagnosis is IBS, was started on MiraLax BID and was told to add dolculax 1-2 caps daily if not having BM qod.  Next steps would be to add dicyclomine/anti-spasmodic vs probiotic vs antibiotic (ex xifaxan).    Only taking miralax sometimes.  Last BM today.  No diarrhea, not hard, didn't have to strain.    DIZZINESS/NEURO Has dizzy "spells" that last for seconds to minutes.  Meclizine has not helped or made a difference.  May happen multiple times per day for several days and then will have periods of days to weeks without any episodes.  Has been occuring for years.  Only really concerns him when it happens when he is driving.  Most recent episode was yesterday.  He feels like he is lost and confused when they occur, feels like he "might be dying" and feels like an out of body experience.  Face becomes warm and numb, but no numbness/tingling/weakness anywhere else.  No shaking or seizure-like activity.  No forgetfulness or black out periods.  No syncope.  Just gets confused and forgets what he's doing or where he's going.  No vision changes, speech changes, or difficulty swallowing.  No palpitations, heart racing, chest pain, SOB, or diaphoresis.  He says that these feel different than when he has a panic attack.  Not affected by position-- sitting or standing does not bring episodes on and does not help episodes resolve.  Only Psych med is ativan 0.5mg  po qhs PRN, which he states he does not take every night.  He also complains of being very forgetful and losing his train of thought when having conversations more easily than before.   ROS: Per HPI  History  Smoking status  . Never Smoker   Smokeless tobacco  . Never Used    O:  Filed Vitals:   06/27/11 1352  BP: 138/84  Pulse: 99    Gen: NAD CV: RRR, no murmur Pulm: CTA bilat, no wheezes or crackles Abd: soft, NT Ext: Warm, no chronic skin changes, no edema Neuro:  A&Ox3, CN 2-12 intact, strength 5/5 throughout, sensation grossly intact throughout; EOMI, PERRLA   A/P: 46 y.o. male p/w dizziness, abd pain -See problem list -f/u in 2-4 weeks

## 2011-07-13 ENCOUNTER — Ambulatory Visit (INDEPENDENT_AMBULATORY_CARE_PROVIDER_SITE_OTHER): Payer: Self-pay | Admitting: *Deleted

## 2011-07-13 DIAGNOSIS — Z111 Encounter for screening for respiratory tuberculosis: Secondary | ICD-10-CM

## 2011-07-16 ENCOUNTER — Ambulatory Visit (INDEPENDENT_AMBULATORY_CARE_PROVIDER_SITE_OTHER): Payer: Self-pay | Admitting: *Deleted

## 2011-07-16 DIAGNOSIS — IMO0001 Reserved for inherently not codable concepts without codable children: Secondary | ICD-10-CM

## 2011-07-16 DIAGNOSIS — Z111 Encounter for screening for respiratory tuberculosis: Secondary | ICD-10-CM

## 2011-07-16 LAB — TB SKIN TEST
Induration: 0
TB Skin Test: NEGATIVE mm

## 2011-07-21 ENCOUNTER — Emergency Department (INDEPENDENT_AMBULATORY_CARE_PROVIDER_SITE_OTHER)
Admission: EM | Admit: 2011-07-21 | Discharge: 2011-07-21 | Disposition: A | Discharge status: Home / Self Care | Payer: Self-pay | Source: Home / Self Care | Attending: Family Medicine | Admitting: Family Medicine

## 2011-07-21 ENCOUNTER — Encounter (HOSPITAL_COMMUNITY): Payer: Self-pay

## 2011-07-21 DIAGNOSIS — J069 Acute upper respiratory infection, unspecified: Secondary | ICD-10-CM

## 2011-07-21 MED ORDER — GUAIFENESIN-CODEINE 100-10 MG/5ML PO SYRP: 10.0000 mL | ORAL_SOLUTION | Freq: Three times a day (TID) | ORAL | Status: AC | PRN

## 2011-07-21 MED ORDER — AZITHROMYCIN 250 MG PO TABS: ORAL_TABLET | ORAL | Status: AC

## 2011-07-21 NOTE — ED Provider Notes (Signed)
History     CSN: [id number]  Arrival date & time 07/21/11  0906   First MD Initiated Contact with Patient 07/21/11 [id number]      Chief Complaint  Patient presents with  . Sore Throat    (Consider location/radiation/quality/duration/timing/severity/associated sxs/prior treatment) Patient is a 46 y.o. male presenting with pharyngitis. The history is provided by the patient.  Sore Throat This is a new problem. The current episode started more than 2 days ago. The problem has not changed since onset.Pertinent negatives include no chest pain and no abdominal pain. The symptoms are aggravated by swallowing.    Past Medical History  Diagnosis Date  . Palpitations   . Family history of stress   . Prostatitis, unspecified   . Anxiety state, unspecified   . Unspecified cardiovascular disease   . IBS (irritable bowel syndrome)   . Visual disturbance   . Esophageal reflux     no meds. per pt.  . Generalized headaches   . Major depressive disorder, single episode, unspecified   . Panic disorder without agoraphobia   . Hypertension   . Coronary artery disease     pt. states Right Bundle Branch Block  . RBBB (right bundle branch block)   . Hiatal hernia     pt. denies nerve/mucscle disease  . Sleep apnea     Past Surgical History  Procedure Date  . Pyloric stenosis    . 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 Right arm     fracture--Right arm fracture s/p ORIF from roller blading.  . Esophagogastroduodenoscopy 01/03/2005    normal  . Fracture surgery 2000    left arm  . Inguinal hernia repair 03/14/2011    Procedure: LAPAROSCOPIC BILATERAL INGUINAL HERNIA REPAIR;  Surgeon: Shelly Rubenstein, MD;  Location: [facility];  Service: General;  Laterality: N/A;  with mesh  . Hernia repair 03/14/2011    BIH  . Colonoscopy 07/26/2005    normal    Family History  Problem Relation Age of Onset  . Heart attack Mother 68    PTCA  and 3 stents  . Heart  disease Mother   . Hodgkin's lymphoma Maternal Grandmother   . Cancer Maternal Aunt     pt unaware of what kind  . Cancer Maternal Uncle     pt unaware of what kind  . Pyloric stenosis Daughter 62    X2  . Anxiety disorder    . Bipolar disorder Daughter     History  Substance Use Topics  . Smoking status: Never Smoker   . Smokeless tobacco: Never Used  . Alcohol Use: No      Review of Systems  Constitutional: Negative.   HENT: Positive for congestion and rhinorrhea.   Respiratory: Positive for cough.   Cardiovascular: Negative for chest pain.  Gastrointestinal: Negative for abdominal pain.    Allergies  Hctz and Penicillins  Home Medications   Current Outpatient Rx  Name Route Sig Dispense Refill  . ALPRAZOLAM 0.5 MG PO TABS Oral Take 0.5 mg by mouth 2 (two) times daily as needed. anxiety    . ASPIRIN 81 MG PO TABS Oral Take 81 mg by mouth daily.     . AZITHROMYCIN 250 MG PO TABS  Take as directed on pack 6 each 0  . CETIRIZINE HCL 10 MG PO TABS Oral Take 10 mg by mouth daily as needed. for allergies    . FLUTICASONE PROPIONATE 50  MCG/ACT NA SUSP Nasal Place 2 sprays into the nose daily.     . GUAIFENESIN-CODEINE 100-10 MG/5ML PO SYRP Oral Take 10 mLs by mouth 3 (three) times daily as needed for cough. 180 mL 0  . METOPROLOL SUCCINATE ER 50 MG PO TB24 Oral Take 25 mg by mouth every morning.      Carma Leaven M PLUS PO TABS Oral Take 1 tablet by mouth every 7 (seven) days.      [name] RANITIDINE HCL 150 MG PO TABS Oral Take 1 tablet (150 mg total) by mouth 2 (two) times daily. 30 tablet 0    BP 140/97  Pulse 67  Temp(Src) 97.4 F (36.3 C) (Oral)  Resp 16  SpO2 98%  Physical Exam  Nursing note and vitals reviewed. Constitutional: He is oriented to person, place, and time. He appears well-developed and well-nourished.  HENT:  Head: Normocephalic.  Right Ear: External ear normal.  Left Ear: External ear normal.  Mouth/Throat: Oropharynx is clear and moist.  Eyes:  Pupils are equal, round, and reactive to light.  Neck: Normal range of motion.  Pulmonary/Chest: Breath sounds normal.  Neurological: He is alert and oriented to person, place, and time.  Skin: Skin is warm and dry.    ED Course  Procedures (including critical care time)  Labs Reviewed - No data to display No results found.   1. URI (upper respiratory infection)       MDM          Linna Hoff, MD 07/21/11 [id number]

## 2011-07-21 NOTE — Discharge Instructions (Signed)
Drink plenty of fluids as discussed, use medicine as prescribed, and mucinex or delsym for cough. Return or see your doctor if further problems °

## 2011-07-21 NOTE — ED Notes (Signed)
PT states that he has had a sore throat for about 4 days, pt states he is not having any pain but he is having body aches.

## 2011-08-09 ENCOUNTER — Ambulatory Visit: Payer: Self-pay

## 2011-08-09 VITALS — BP 130/87 | HR 76 | Temp 98.2°F | Ht 66.0 in | Wt 154.0 lb

## 2011-08-09 DIAGNOSIS — R059 Cough, unspecified: Secondary | ICD-10-CM | POA: Insufficient documentation

## 2011-08-09 DIAGNOSIS — J45901 Unspecified asthma with (acute) exacerbation: Secondary | ICD-10-CM

## 2011-08-09 DIAGNOSIS — J069 Acute upper respiratory infection, unspecified: Secondary | ICD-10-CM

## 2011-08-09 DIAGNOSIS — R05 Cough: Secondary | ICD-10-CM

## 2011-08-09 MED ORDER — PREDNISONE 50 MG PO TABS: ORAL_TABLET | ORAL | Status: AC

## 2011-08-09 MED ORDER — ALBUTEROL SULFATE HFA 108 (90 BASE) MCG/ACT IN AERS: 2.0000 | INHALATION_SPRAY | Freq: Four times a day (QID) | RESPIRATORY_TRACT | Status: DC | PRN

## 2011-08-09 NOTE — Patient Instructions (Signed)
Asthma Attack Prevention HOW CAN ASTHMA BE PREVENTED? Currently, there is no way to prevent asthma from starting. However, you can take steps to control the disease and prevent its symptoms after you have been diagnosed. Learn about your asthma and how to control it. Take an active role to control your asthma by working with your caregiver to create and follow an asthma action plan. An asthma action plan guides you in taking your medicines properly, avoiding factors that make your asthma worse, tracking your level of asthma control, responding to worsening asthma, and seeking emergency care when needed. To track your asthma, keep records of your symptoms, check your peak flow number using a peak flow meter (handheld device that shows how well air moves out of your lungs), and get regular asthma checkups.  Other ways to prevent asthma attacks include:  Use medicines as your caregiver directs.   Identify and avoid things that make your asthma worse (as much as you can).   Keep track of your asthma symptoms and level of control.   Get regular checkups for your asthma.   With your caregiver, write a detailed plan for taking medicines and managing an asthma attack. Then be sure to follow your action plan. Asthma is an ongoing condition that needs regular monitoring and treatment.   Identify and avoid asthma triggers. A number of outdoor allergens and irritants (pollen, mold, cold air, air pollution) can trigger asthma attacks. Find out what causes or makes your asthma worse, and take steps to avoid those triggers (see below).   Monitor your breathing. Learn to recognize warning signs of an attack, such as slight coughing, wheezing or shortness of breath. However, your lung function may already decrease before you notice any signs or symptoms, so regularly measure and record your peak airflow with a home peak flow meter.   Identify and treat attacks early. If you act quickly, you're less likely to have  a severe attack. You will also need less medicine to control your symptoms. When your peak flow measurements decrease and alert you to an upcoming attack, take your medicine as instructed, and immediately stop any activity that may have triggered the attack. If your symptoms do not improve, get medical help.   Pay attention to increasing quick-relief inhaler use. If you find yourself relying on your quick-relief inhaler (such as albuterol), your asthma is not under control. See your caregiver about adjusting your treatment.  IDENTIFY AND CONTROL FACTORS THAT MAKE YOUR ASTHMA WORSE A number of common things can set off or make your asthma symptoms worse (asthma triggers). Keep track of your asthma symptoms for several weeks, detailing all the environmental and emotional factors that are linked with your asthma. When you have an asthma attack, go back to your asthma diary to see which factor, or combination of factors, might have contributed to it. Once you know what these factors are, you can take steps to control many of them.  Allergies: If you have allergies and asthma, it is important to take asthma prevention steps at home. Asthma attacks (worsening of asthma symptoms) can be triggered by allergies, which can cause temporary increased inflammation of your airways. Minimizing contact with the substance to which you are allergic will help prevent an asthma attack. Animal Dander:   Some people are allergic to the flakes of skin or dried saliva from animals with fur or feathers. Keep these pets out of your home.   If you can't keep a pet outdoors, keep the   pet out of your bedroom and other sleeping areas at all times, and keep the door closed.   Remove carpets and furniture covered with cloth from your home. If that is not possible, keep the pet away from fabric-covered furniture and carpets.  Dust Mites:  Many people with asthma are allergic to dust mites. Dust mites are tiny bugs that are found in  every home, in mattresses, pillows, carpets, fabric-covered furniture, bedcovers, clothes, stuffed toys, fabric, and other fabric-covered items.   Cover your mattress in a special dust-proof cover.   Cover your pillow in a special dust-proof cover, or wash the pillow each week in hot water. Water must be hotter than 130 F to kill dust mites. Cold or warm water used with detergent and bleach can also be effective.   Wash the sheets and blankets on your bed each week in hot water.   Try not to sleep or lie on cloth-covered cushions.   Call ahead when traveling and ask for a smoke-free hotel room. Bring your own bedding and pillows, in case the hotel only supplies feather pillows and down comforters, which may contain dust mites and cause asthma symptoms.   Remove carpets from your bedroom and those laid on concrete, if you can.   Keep stuffed toys out of the bed, or wash the toys weekly in hot water or cooler water with detergent and bleach.  Cockroaches:  Many people with asthma are allergic to the droppings and remains of cockroaches.   Keep food and garbage in closed containers. Never leave food out.   Use poison baits, traps, powders, gels, or paste (for example, boric acid).   If a spray is used to kill cockroaches, stay out of the room until the odor goes away.  Indoor Mold:  Fix leaky faucets, pipes, or other sources of water that have mold around them.   Clean moldy surfaces with a cleaner that has bleach in it.  Pollen and Outdoor Mold:  When pollen or mold spore counts are high, try to keep your windows closed.   Stay indoors with windows closed from late morning to afternoon, if you can. Pollen and some mold spore counts are highest at that time.   Ask your caregiver whether you need to take or increase anti-inflammatory medicine before your allergy season starts.  Irritants:   Tobacco smoke is an irritant. If you smoke, ask your caregiver how you can quit. Ask family  members to quit smoking, too. Do not allow smoking in your home or car.   If possible, do not use a wood-burning stove, kerosene heater, or fireplace. Minimize exposure to all sources of smoke, including incense, candles, fires, and fireworks.   Try to stay away from strong odors and sprays, such as perfume, talcum powder, hair spray, and paints.   Decrease humidity in your home and use an indoor air cleaning device. Reduce indoor humidity to below 60 percent. Dehumidifiers or central air conditioners can do this.   Try to have someone else vacuum for you once or twice a week, if you can. Stay out of rooms while they are being vacuumed and for a short while afterward.   If you vacuum, use a dust mask from a hardware store, a double-layered or microfilter vacuum cleaner bag, or a vacuum cleaner with a HEPA filter.   Sulfites in foods and beverages can be irritants. Do not drink beer or wine, or eat dried fruit, processed potatoes, or shrimp if they cause asthma   symptoms.   Cold air can trigger an asthma attack. Cover your nose and mouth with a scarf on cold or windy days.   Several health conditions can make asthma more difficult to manage, including runny nose, sinus infections, reflux disease, psychological stress, and sleep apnea. Your caregiver will treat these conditions, as well.   Avoid close contact with people who have a cold or the flu, since your asthma symptoms may get worse if you catch the infection from them. Wash your hands thoroughly after touching items that may have been handled by people with a respiratory infection.   Get a flu shot every year to protect against the flu virus, which often makes asthma worse for days or weeks. Also get a pneumonia shot once every five to 10 years.  Drugs:  Aspirin and other painkillers can cause asthma attacks. 10% to 20% of people with asthma have sensitivity to aspirin or a group of painkillers called non-steroidal anti-inflammatory drugs  (NSAIDS), such as ibuprofen and naproxen. These drugs are used to treat pain and reduce fevers. Asthma attacks caused by any of these medicines can be severe and even fatal. These drugs must be avoided in people who have known aspirin sensitive asthma. Products with acetaminophen are considered safe for people who have asthma. It is important that people with aspirin sensitivity read labels of all over-the-counter drugs used to treat pain, colds, coughs, and fever.   Beta blockers and ACE inhibitors are other drugs which you should discuss with your caregiver, in relation to your asthma.  ALLERGY SKIN TESTING  Ask your asthma caregiver about allergy skin testing or blood testing (RAST test) to identify the allergens to which you are sensitive. If you are found to have allergies, allergy shots (immunotherapy) for asthma may help prevent future allergies and asthma. With allergy shots, small doses of allergens (substances to which you are allergic) are injected under your skin on a regular schedule. Over a period of time, your body may become used to the allergen and less responsive with asthma symptoms. You can also take measures to minimize your exposure to those allergens. EXERCISE  If you have exercise-induced asthma, or are planning vigorous exercise, or exercise in cold, humid, or dry environments, prevent exercise-induced asthma by following your caregiver's advice regarding asthma treatment before exercising. Document Released: 04/11/2009 Document Revised: 04/12/2011 Document Reviewed: 04/11/2009 [employer] Patient Information 2012 [employer]. 

## 2011-08-09 NOTE — Assessment & Plan Note (Addendum)
Highly consistent with asthma exacerbation with component of cold induced bronchospasm. Unclear if this is allergic/viral induced or both. We'll clinically treat with glucocorticoids. Depo-Medrol 80 in clinic x1. Will place patient on 4 day course of outpatient prednisone starting April 5. Will also give patient a when necessary albuterol inhaler. Discussed with patient limited use of albuterol only as needed as sympathetic effects can exacerbate anxiety. Also discussed mildly similar effects with prednisone. Patient was aware of this and says that he has used albuterol and steroid in the past and only use these sparingly.  Peak Flows: [id number] Predicted: 533  Will need outpatient PFTs in 4-8 weeks for confirmatory diagnosis.

## 2011-08-09 NOTE — Progress Notes (Unsigned)
  Subjective:    Patient ID: Adam [name], male    DOB: 03-Feb-1966, 46 y.o.   MRN: [id number]  HPI URI Symptoms Onset: 2 days  Description: cough, SOB, chest tightness ass'd with breathing in cold air and pollen Modifying factors: Had similar episode of this 1 month ago that lasted 1 week. Pt w/ ?PMHx/o asthma. Pt is a non-smoker.   Symptoms Nasal discharge: mild  Fever: no Sore throat: mild Cough: yes Wheezing: yes Ear pain: no GI symptoms: no Sick contacts: no  Red Flags  Stiff neck: no Dyspnea: mild Rash: no Swallowing difficulty: no  Sinusitis Risk Factors Headache/face pain: no Double sickening: no tooth pain: no  Allergy Risk Factors Sneezing: no Itchy scratchy throat: yes Seasonal symptoms: yes  Flu Risk Factors Headache: no muscle aches: no severe fatigue: no  Review of Systems See HPI, otherwise ROS negative     Objective:   Physical Exam Gen: up in chair, NAD HEENT: NCAT, EOMI, TMs clear bilaterally, +nasal erythema, rhinorrhea bilaterally, + post oropharyngeal erythema  CV: RRR, no murmurs auscultated PULM: good overall air movement, + pain with deep inspiration, + expiratory wheezes diffusely ABD: S/NT/+ bowel sounds  EXT: 2+ peripheral pulses   Assessment & Plan:

## 2011-08-13 DIAGNOSIS — J069 Acute upper respiratory infection, unspecified: Secondary | ICD-10-CM | POA: Insufficient documentation

## 2011-08-13 NOTE — Assessment & Plan Note (Addendum)
Viral induced asthma exacerbation. WIll place on short course of prednisone for this. Rx also given for albuterol. Overall dx of true asthma is currently unclear, so will defer on formal labeling of this. Discussed activating SEs of both prednisone and albuterol in setting of anxiety. Discussed resp red flags at length. Follow up with PCP prn.

## 2011-08-28 ENCOUNTER — Encounter: Payer: Self-pay | Admitting: Pharmacist

## 2011-08-28 ENCOUNTER — Ambulatory Visit (INDEPENDENT_AMBULATORY_CARE_PROVIDER_SITE_OTHER): Payer: Self-pay | Admitting: Pharmacist

## 2011-08-28 VITALS — BP 138/87 | HR 69 | Ht 66.0 in | Wt 154.8 lb

## 2011-08-28 DIAGNOSIS — R0602 Shortness of breath: Secondary | ICD-10-CM

## 2011-08-28 DIAGNOSIS — F410 Panic disorder [episodic paroxysmal anxiety] without agoraphobia: Secondary | ICD-10-CM

## 2011-08-28 MED ORDER — BECLOMETHASONE DIPROPIONATE 80 MCG/ACT IN AERS: 2.0000 | INHALATION_SPRAY | RESPIRATORY_TRACT | Status: DC | PRN

## 2011-08-28 NOTE — Patient Instructions (Signed)
Near Normal Lung function test today.  Pick up QVAR from [facility] when Rx available - start 2 inhalations twice daily.  Consider restarting allergy - antihistamine pill daily for allergy symptoms.  Next visit with Dr. Fara Boros

## 2011-08-28 NOTE — Progress Notes (Signed)
  Subjective:    Patient ID: Adam [name], male    DOB: [birth year], 46 y.o.   MRN: [id number]  HPI Patient presents with wife in good spirits for spirometry test. Patient states that he has had chronic breathing difficulties more most of his adulthood. He describes his symptoms as follows: easily becomes short of breath, feels like a weight is on his chest constantly. His symptoms are worse in the spring during allergy season.  He is exposed to cigarette smoke from the spouse.   He recently visited urgent care on 3/16 for URI Sx and was prescribed Azithromycin and cough medicine. Shortly thereafter on 4/4, he was seen at clinic for respiratory symptoms of SOB, cough, chest tightness.   He has congestions and feels like he is being "smothered".   Review of Systems     Objective:   Physical Exam See Documentation Flowsheet (discrete results - PFTs) for complete Spirometry results. Patient provided good effort while attempting spirometry.   Lung Age = normal Albuterol Neb  Lot# [id number]     Exp. 03/2013      Assessment & Plan:  Spirometry evaluation with Pre Bronchodilator (taken Albuterol at home this AM) reveals normal lung function.  Patient has been experiencing chest tightness and intermittent dyspnea for most of his adulthood and taking albuterol inhaler as needed for wheezing.  He has difficulty affording medications including the cost of inhaled steroids.  Will attempt to obtain inhaled steroid from [facility].  QVAR 2 puffs BID called in to [facility].  Continue current treatment plan at this time.  Reviewed results of pulmonary function tests.  Pt verbalized understanding of results.    F/U Clinic visit with Dr. Fara Boros to reevaluate symptoms after starting inhaled steroid.  Total time in face to face counseling 40 minutes.  Patient seen with Tana Conch, PharmD, Pharmacy Resident. [name]

## 2011-08-28 NOTE — Assessment & Plan Note (Signed)
Patient on beta blocker and alprazolam for panic attacks.    Unknown if alternative agent including SSRI could be useful and decrease need for beta-blocker in this patient with lung sx.

## 2011-08-28 NOTE — Assessment & Plan Note (Addendum)
Spirometry evaluation with Pre Bronchodilator (taken Albuterol at home this AM) reveals normal lung function.  Patient has been experiencing chest tightness and intermittent dyspnea for most of his adulthood and taking albuterol inhaler as needed for wheezing.  He has difficulty affording medications including the cost of inhaled steroids.  Will attempt to obtain inhaled steroid from [facility].  QVAR 2 puffs BID called in to [facility].  Continue current treatment plan at this time.  Reviewed results of pulmonary function tests.  Pt verbalized understanding of results.    F/U Clinic visit with Dr. Fara Boros to reevaluate symptoms after starting inhaled steroid.  Total time in face to face counseling 40 minutes.  Patient seen with Tana Conch, PharmD, Pharmacy Resident.

## 2011-08-28 NOTE — Progress Notes (Signed)
  Subjective:    Patient ID: Adam [name], male    DOB: [birth year], 46 y.o.   MRN: [id number]  HPI Reviewed and agree with Dr. Macky Lower management.    Review of Systems     Objective:   Physical Exam        Assessment & Plan:

## 2011-09-19 ENCOUNTER — Ambulatory Visit: Payer: Self-pay | Admitting: Family Medicine

## 2011-10-17 ENCOUNTER — Encounter: Payer: Self-pay | Admitting: Family Medicine

## 2011-10-17 ENCOUNTER — Ambulatory Visit (INDEPENDENT_AMBULATORY_CARE_PROVIDER_SITE_OTHER): Payer: Self-pay | Admitting: Family Medicine

## 2011-10-17 VITALS — BP 134/79 | HR 80 | Ht 66.0 in | Wt 149.0 lb

## 2011-10-17 DIAGNOSIS — R5383 Other fatigue: Secondary | ICD-10-CM

## 2011-10-17 DIAGNOSIS — R5381 Other malaise: Secondary | ICD-10-CM

## 2011-10-17 LAB — CBC
HCT: 44.8 % (ref 39.0–52.0)
Hemoglobin: 15.1 g/dL (ref 13.0–17.0)
MCH: 28.3 pg (ref 26.0–34.0)
MCHC: 33.7 g/dL (ref 30.0–36.0)
MCV: 83.9 fL (ref 78.0–100.0)
Platelets: 282 10*3/uL (ref 150–400)
RBC: 5.34 MIL/uL (ref 4.22–5.81)
RDW: 13.7 % (ref 11.5–15.5)
WBC: 7.7 10*3/uL (ref 4.0–10.5)

## 2011-10-17 LAB — BASIC METABOLIC PANEL
BUN: 25 mg/dL — ABNORMAL HIGH (ref 6–23)
CO2: 26 mEq/L (ref 19–32)
Calcium: 9.9 mg/dL (ref 8.4–10.5)
Chloride: 106 mEq/L (ref 96–112)
Creat: 1.24 mg/dL (ref 0.50–1.35)
Glucose, Bld: 90 mg/dL (ref 70–99)
Potassium: 4.3 mEq/L (ref 3.5–5.3)
Sodium: 143 mEq/L (ref 135–145)

## 2011-10-17 NOTE — Patient Instructions (Signed)
Thank you for coming in today. I am worried about Sleep Apnea as the cause of your fatigue.  We will also test your iron and thyroid.  Please follow up with your doctor in a few weeks.  Call or go to the emergency room if you get worse, have trouble breathing, have chest pains, or palpitations.

## 2011-10-17 NOTE — Assessment & Plan Note (Signed)
I am concerned about sleep apnea as a cause of this gentleman fatigue. He has daytime sleepiness and often awakes at night feeling as though he cannot catch his breath.  Plan for split-night sleep study, CBC, TSH, basic metabolic panel. Follow up with primary care doctor in a few weeks. Discussed warning signs or symptoms. Please see discharge instructions. Patient expresses understanding.

## 2011-10-17 NOTE — Progress Notes (Signed)
Adam [name] is a 46 y.o. male who presents to [facility] today for significant fatigue for months. Patient notes excessive daytime sleepiness and difficulty sleeping. He often wakes at night feeling as though he is being smothered and he cannot catch his breath. He has never been worked up for sleep apnea. He has had spirometry which was relatively normal recently.  He denies any chest pains or palpitations. He does describe occasional shortness of breath that albuterol does not help.   PMH: Reviewed significant for anxiety and hypertension History  Substance Use Topics  . Smoking status: Never Smoker   . Smokeless tobacco: Never Used  . Alcohol Use: No   ROS as above  Medications reviewed. Current Outpatient Prescriptions  Medication Sig Dispense Refill  . ALPRAZolam (XANAX) 0.5 MG tablet Take 0.5 mg by mouth 2 (two) times daily as needed. anxiety      . metoprolol (TOPROL-XL) 50 MG 24 hr tablet Take 25 mg by mouth every morning.        [name] albuterol (PROVENTIL HFA;VENTOLIN HFA) 108 (90 BASE) MCG/ACT inhaler Inhale 2 puffs into the lungs every 6 (six) hours as needed for wheezing.  1 Inhaler  0  . aspirin 81 MG tablet Take 81 mg by mouth daily.       . beclomethasone (QVAR) 80 MCG/ACT inhaler Inhale 2 puffs into the lungs as needed.  1 Inhaler  11  . cetirizine (ZYRTEC) 10 MG tablet Take 10 mg by mouth daily as needed. for allergies      . fluticasone (FLONASE) 50 MCG/ACT nasal spray Place 2 sprays into the nose daily.       . Multiple Vitamins-Minerals (MULTIVITAMINS THER. W/MINERALS) TABS Take 1 tablet by mouth every 7 (seven) days.        . ranitidine (ZANTAC) 150 MG tablet Take 150 mg by mouth 2 (two) times daily as needed.      [name] DISCONTD: metoprolol (TOPROL-XL) 50 MG 24 hr tablet Take 0.5 tablets (25 mg total) by mouth daily.  30 tablet  5    Exam:  BP 134/79  Pulse 80  Ht 5\' 6"  (1.676 m)  Wt 149 lb (67.586 kg)  BMI 24.05 kg/m2  SpO2 98% Gen: Well NAD HEENT: EOMI,  MMM,  normal-appearing soft palate and tongue Lungs: CTABL Nl WOB Heart: RRR no MRG Exts: Non edematous BL  LE, warm and well perfused.   No results found for this or any previous visit (from the past 72 hour(s)).

## 2011-10-18 LAB — TSH: TSH: 1.093 u[IU]/mL (ref 0.350–4.500)

## 2011-11-12 ENCOUNTER — Ambulatory Visit (HOSPITAL_BASED_OUTPATIENT_CLINIC_OR_DEPARTMENT_OTHER): Payer: Self-pay

## 2012-02-04 ENCOUNTER — Encounter (HOSPITAL_COMMUNITY): Payer: Self-pay | Admitting: Emergency Medicine

## 2012-02-04 ENCOUNTER — Emergency Department (INDEPENDENT_AMBULATORY_CARE_PROVIDER_SITE_OTHER)
Admission: EM | Admit: 2012-02-04 | Discharge: 2012-02-04 | Disposition: A | Discharge status: Nursing Home (Includes ICF) | Payer: Self-pay | Source: Home / Self Care | Attending: Emergency Medicine | Admitting: Emergency Medicine

## 2012-02-04 ENCOUNTER — Encounter (HOSPITAL_COMMUNITY): Payer: Self-pay | Admitting: *Deleted

## 2012-02-04 ENCOUNTER — Emergency Department (HOSPITAL_COMMUNITY)
Admission: EM | Admit: 2012-02-04 | Discharge: 2012-02-04 | Disposition: A | Discharge status: Home / Self Care | Payer: Self-pay | Attending: Emergency Medicine | Admitting: Emergency Medicine

## 2012-02-04 ENCOUNTER — Emergency Department (HOSPITAL_COMMUNITY): Payer: Self-pay

## 2012-02-04 DIAGNOSIS — R0602 Shortness of breath: Secondary | ICD-10-CM | POA: Insufficient documentation

## 2012-02-04 DIAGNOSIS — I200 Unstable angina: Secondary | ICD-10-CM

## 2012-02-04 DIAGNOSIS — I2510 Atherosclerotic heart disease of native coronary artery without angina pectoris: Secondary | ICD-10-CM | POA: Insufficient documentation

## 2012-02-04 DIAGNOSIS — I10 Essential (primary) hypertension: Secondary | ICD-10-CM | POA: Insufficient documentation

## 2012-02-04 DIAGNOSIS — R079 Chest pain, unspecified: Secondary | ICD-10-CM | POA: Insufficient documentation

## 2012-02-04 DIAGNOSIS — R42 Dizziness and giddiness: Secondary | ICD-10-CM | POA: Insufficient documentation

## 2012-02-04 LAB — CBC WITH DIFFERENTIAL/PLATELET
Eosinophils Absolute: 0.1 10*3/uL (ref 0.0–0.7)
Eosinophils Relative: 1 % (ref 0–5)
HCT: 44.1 % (ref 39.0–52.0)
Lymphocytes Relative: 13 % (ref 12–46)
Lymphs Abs: 1.5 10*3/uL (ref 0.7–4.0)
MCH: 28.6 pg (ref 26.0–34.0)
MCV: 83.1 fL (ref 78.0–100.0)
Monocytes Absolute: 0.8 10*3/uL (ref 0.1–1.0)
Monocytes Relative: 8 % (ref 3–12)
RBC: 5.31 MIL/uL (ref 4.22–5.81)
WBC: 11.1 10*3/uL — ABNORMAL HIGH (ref 4.0–10.5)

## 2012-02-04 LAB — COMPREHENSIVE METABOLIC PANEL
ALT: 27 U/L (ref 0–53)
BUN: 17 mg/dL (ref 6–23)
CO2: 25 mEq/L (ref 19–32)
Calcium: 9.5 mg/dL (ref 8.4–10.5)
Creatinine, Ser: 1.15 mg/dL (ref 0.50–1.35)
GFR calc Af Amer: 86 mL/min — ABNORMAL LOW (ref >90)
GFR calc non Af Amer: 74 mL/min — ABNORMAL LOW (ref >90)
Glucose, Bld: 96 mg/dL (ref 70–99)
Total Protein: 7.2 g/dL (ref 6.0–8.3)

## 2012-02-04 LAB — TROPONIN I: Troponin I: <0.3 ng/mL (ref <0.30)

## 2012-02-04 LAB — POCT I-STAT TROPONIN I: Troponin i, poc: 0 ng/mL (ref 0.00–0.08)

## 2012-02-04 MED ORDER — NITROGLYCERIN 0.4 MG SL SUBL
0.4000 mg | SUBLINGUAL_TABLET | SUBLINGUAL | Status: AC | PRN
  Administered 2012-02-04: 0.4 mg via SUBLINGUAL

## 2012-02-04 MED ORDER — ASPIRIN 81 MG PO CHEW
CHEWABLE_TABLET | ORAL | Status: AC
  Filled 2012-02-04: qty 4

## 2012-02-04 MED ORDER — NITROGLYCERIN 0.4 MG SL SUBL
SUBLINGUAL_TABLET | SUBLINGUAL | Status: AC
  Filled 2012-02-04: qty 25

## 2012-02-04 MED ORDER — SODIUM CHLORIDE 0.9 % IV SOLN
INTRAVENOUS | Status: DC
  Administered 2012-02-04: 12:00:00 via INTRAVENOUS

## 2012-02-04 MED ORDER — ASPIRIN 81 MG PO CHEW
324.0000 mg | CHEWABLE_TABLET | Freq: Once | ORAL | Status: AC
  Administered 2012-02-04: 324 mg via ORAL

## 2012-02-04 NOTE — ED Notes (Signed)
Pt placed on O2 with a nasal canula at 2lpm

## 2012-02-04 NOTE — ED Provider Notes (Signed)
History     CSN: [id number]  Arrival date & time 02/04/12  1251   First MD Initiated Contact with Patient 02/04/12 1255      Chief Complaint  Patient presents with  . Chest Pain    (Consider location/radiation/quality/duration/timing/severity/associated sxs/prior treatment) HPI Comments: Adam [name] is a 46 y.o. Male who presents with complaint of chest pain and shortness of breath. Pt states he has had chest pain "for years," however, in the last few weeks, it has been worsening. States lately, he would get a chest pain every time he excerts himself, including fast walking and throwing a ball with his son. Pt states in addition to CP he get SOB, dizzy, weak. States that today was at work and developed chest pain and got very weak and dizzy, and felt like he was going to pas out. Pt states he went to [facility] where he was evaluated and sent here. Pt states he had a negative stress test about 10 years ago, followed by Dr. Daleen Squibb. Pt received 324 mg of ASA and a SL nitro with no improvement, but states now that he has been laying down, his symptoms are improving.    Past Medical History  Diagnosis Date  . Palpitations   . Family history of stress   . Prostatitis, unspecified   . Anxiety state, unspecified   . Unspecified cardiovascular disease   . IBS (irritable bowel syndrome)   . Visual disturbance   . Esophageal reflux     no meds. per pt.  . Generalized headaches   . Major depressive disorder, single episode, unspecified   . Panic disorder without agoraphobia   . Hypertension   . Coronary artery disease     pt. states Right Bundle Branch Block  . RBBB (right bundle branch block)   . Hiatal hernia     pt. denies nerve/mucscle disease  . Sleep apnea     Past Surgical History  Procedure Date  . Pyloric stenosis    . 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 Right arm     fracture--Right arm fracture s/p ORIF from roller blading.  .  Esophagogastroduodenoscopy 01/03/2005    normal  . Fracture surgery 2000    left arm  . Inguinal hernia repair 03/14/2011    Procedure: LAPAROSCOPIC BILATERAL INGUINAL HERNIA REPAIR;  Surgeon: Shelly Rubenstein, MD;  Location: [facility];  Service: General;  Laterality: N/A;  with mesh  . Hernia repair 03/14/2011    BIH  . Colonoscopy 07/26/2005    normal    Family History  Problem Relation Age of Onset  . Heart attack Mother 54    PTCA  and 3 stents  . Heart disease Mother   . Hodgkin's lymphoma Maternal Grandmother   . Cancer Maternal Aunt     pt unaware of what kind  . Cancer Maternal Uncle     pt unaware of what kind  . Pyloric stenosis Daughter 36    X2  . Anxiety disorder    . Bipolar disorder Daughter     History  Substance Use Topics  . Smoking status: Never Smoker   . Smokeless tobacco: Never Used  . Alcohol Use: No      Review of Systems  Constitutional: Negative for fever and chills.  HENT: Negative for neck pain.   Eyes: Negative for visual disturbance.  Respiratory: Positive for chest tightness and shortness of breath.   Cardiovascular: Positive  for chest pain. Negative for palpitations and leg swelling.  Gastrointestinal: Negative.   Musculoskeletal: Negative.   Skin: Negative.   Neurological: Positive for dizziness. Negative for weakness, light-headedness, numbness and headaches.  Psychiatric/Behavioral: Negative.     Allergies  Hctz and Penicillins  Home Medications   Current Outpatient Rx  Name Route Sig Dispense Refill  . ALPRAZOLAM 0.5 MG PO TABS Oral Take 0.5 mg by mouth at bedtime as needed. For sleep    . METOPROLOL SUCCINATE ER 50 MG PO TB24 Oral Take 25 mg by mouth every morning.      Carma Leaven M PLUS PO TABS Oral Take 1 tablet by mouth every 7 (seven) days.      [name] RANITIDINE HCL 150 MG PO TABS Oral Take 150 mg by mouth 2 (two) times daily as needed. For indigestion    . ALBUTEROL SULFATE HFA 108 (90 BASE) MCG/ACT IN AERS Inhalation Inhale  2 puffs into the lungs every 6 (six) hours as needed. Shortness of breath      BP 144/95  Pulse 73  Resp 18  SpO2 98%  Physical Exam  Nursing note and vitals reviewed. Constitutional: He is oriented to person, place, and time. He appears well-developed and well-nourished. No distress.  HENT:  Head: Normocephalic.  Eyes: Conjunctivae normal are normal.  Cardiovascular: Normal rate, regular rhythm and normal heart sounds.   Pulmonary/Chest: Effort normal and breath sounds normal. No respiratory distress. He has no wheezes. He has no rales.  Abdominal: Soft. Bowel sounds are normal. He exhibits no distension. There is no tenderness. There is no rebound.  Musculoskeletal: He exhibits no edema.  Neurological: He is alert and oriented to person, place, and time.  Skin: Skin is warm and dry.  Psychiatric: He has a normal mood and affect.    ED Course  Procedures (including critical care time)   Date: 02/04/2012  Rate: 69  Rhythm: normal sinus rhythm  QRS Axis: normal  Intervals: normal  ST/T Wave abnormalities: normal  Conduction Disutrbances:nonspecific intraventricular conduction delay  Narrative Interpretation:   Old EKG Reviewed: unchanged  Pt with exertional chest pain, sob, persyncope today. Will gt labs, ECG unchanged, cxr. VS normal   Filed Vitals:   02/04/12 1503  BP: 126/88  Pulse: 71  Resp: 20   Results for orders placed during the hospital encounter of 02/04/12  CBC WITH DIFFERENTIAL      Component Value Range   WBC 11.1 (*) 4.0 - 10.5 K/uL   RBC 5.31  4.22 - 5.81 MIL/uL   Hemoglobin 15.2  13.0 - 17.0 g/dL   HCT 82.9  56.2 - 13.0 %   MCV 83.1  78.0 - 100.0 fL   MCH 28.6  26.0 - 34.0 pg   MCHC 34.5  30.0 - 36.0 g/dL   RDW 86.5  78.4 - 69.6 %   Platelets 225  150 - 400 K/uL   Neutrophils Relative 78 (*) 43 - 77 %   Neutro Abs 8.7 (*) 1.7 - 7.7 K/uL   Lymphocytes Relative 13  12 - 46 %   Lymphs Abs 1.5  0.7 - 4.0 K/uL   Monocytes Relative 8  3 - 12 %    Monocytes Absolute 0.8  0.1 - 1.0 K/uL   Eosinophils Relative 1  0 - 5 %   Eosinophils Absolute 0.1  0.0 - 0.7 K/uL   Basophils Relative 1  0 - 1 %   Basophils Absolute 0.1  0.0 - 0.1 K/uL  COMPREHENSIVE METABOLIC PANEL      Component Value Range   Sodium 140  135 - 145 mEq/L   Potassium 4.0  3.5 - 5.1 mEq/L   Chloride 102  96 - 112 mEq/L   CO2 25  19 - 32 mEq/L   Glucose, Bld 96  70 - 99 mg/dL   BUN 17  6 - 23 mg/dL   Creatinine, Ser 9.60  0.50 - 1.35 mg/dL   Calcium 9.5  8.4 - 45.4 mg/dL   Total Protein 7.2  6.0 - 8.3 g/dL   Albumin 4.3  3.5 - 5.2 g/dL   AST 23  0 - 37 U/L   ALT 27  0 - 53 U/L   Alkaline Phosphatase 81  39 - 117 U/L   Total Bilirubin 0.4  0.3 - 1.2 mg/dL   GFR calc non Af Amer 74 (*) >90 mL/min   GFR calc Af Amer 86 (*) >90 mL/min  POCT I-STAT TROPONIN I      Component Value Range   Troponin i, poc 0.00  0.00 - 0.08 ng/mL   Comment 3            Dg Chest 2 View  02/04/2012  *RADIOLOGY REPORT*  Clinical Data: Mid chest pain  CHEST - 2 VIEW  Comparison: Chest radiograph 02/08/2011  Findings: Normal heart, mediastinal, and hilar contours.  Normal pulmonary vascularity.  Trachea is midline.  The lungs are well expanded and clear.  No change compared to recent priors.  Bony thorax is intact.  Visualized upper abdomen is unremarkable.  IMPRESSION: No acute cardiopulmonary disease.   Original Report Authenticated By: Britta Mccreedy, M.D.    4:15 PM Spoke with Family Practice who came and saw pt, plan to get another set of enzymes, d dimer. D/c home with nitro. He will be set up with Dr. Daleen Squibb this week for follow up. Discussed with Dr. Lynelle Doctor, ok with that plan.    1. Chest pain       MDM  Pt with exertional chest pain, sob. Pt has family hx of cardiac disease in his mother, who had an MI at age 66. Work up here unremarkable. Spoke with family practice, will admit.    Family practice seen pt, agree to see in the office this week, will wait on another set of cardiac  markers and d dimer. Disposition based on results.      Lottie Mussel, PA 02/05/12 1640

## 2012-02-04 NOTE — ED Notes (Signed)
Pt  Placed  On  Cardiac  Monitor  Nasal  o2  At  2 l  /  Min   

## 2012-02-04 NOTE — ED Provider Notes (Signed)
Adam [name] is a 46 year old male who presents today with a three-day history of substernal chest pain and seen originally by [employer]. He was placed in the CDU to wait for a second enzyme and d-dimer.  Patient was also seen by Dr. Armen Pickup from [facility] in the ED.  Patients labs and exam all normal. Dr. Roselyn Bering., Lemont Fillers, PA-C, Dr. Armen Pickup have all agreed that if patients pain is better, the Trop and D-dimer is negative he can go home with follow-up with Dr. Daleen Squibb.  I have discussed results with patient and he is agreeable and comfortable with the plan.  Results for orders placed during the hospital encounter of 02/04/12  CBC WITH DIFFERENTIAL      Component Value Range   WBC 11.1 (*) 4.0 - 10.5 K/uL   RBC 5.31  4.22 - 5.81 MIL/uL   Hemoglobin 15.2  13.0 - 17.0 g/dL   HCT 95.6  21.3 - 08.6 %   MCV 83.1  78.0 - 100.0 fL   MCH 28.6  26.0 - 34.0 pg   MCHC 34.5  30.0 - 36.0 g/dL   RDW 57.8  46.9 - 62.9 %   Platelets 225  150 - 400 K/uL   Neutrophils Relative 78 (*) 43 - 77 %   Neutro Abs 8.7 (*) 1.7 - 7.7 K/uL   Lymphocytes Relative 13  12 - 46 %   Lymphs Abs 1.5  0.7 - 4.0 K/uL   Monocytes Relative 8  3 - 12 %   Monocytes Absolute 0.8  0.1 - 1.0 K/uL   Eosinophils Relative 1  0 - 5 %   Eosinophils Absolute 0.1  0.0 - 0.7 K/uL   Basophils Relative 1  0 - 1 %   Basophils Absolute 0.1  0.0 - 0.1 K/uL  COMPREHENSIVE METABOLIC PANEL      Component Value Range   Sodium 140  135 - 145 mEq/L   Potassium 4.0  3.5 - 5.1 mEq/L   Chloride 102  96 - 112 mEq/L   CO2 25  19 - 32 mEq/L   Glucose, Bld 96  70 - 99 mg/dL   BUN 17  6 - 23 mg/dL   Creatinine, Ser 5.28  0.50 - 1.35 mg/dL   Calcium 9.5  8.4 - 41.3 mg/dL   Total Protein 7.2  6.0 - 8.3 g/dL   Albumin 4.3  3.5 - 5.2 g/dL   AST 23  0 - 37 U/L   ALT 27  0 - 53 U/L   Alkaline Phosphatase 81  39 - 117 U/L   Total Bilirubin 0.4  0.3 - 1.2 mg/dL   GFR calc non Af Amer 74 (*) >90 mL/min   GFR calc Af Amer 86 (*) >90 mL/min  POCT  I-STAT TROPONIN I      Component Value Range   Troponin i, poc 0.00  0.00 - 0.08 ng/mL   Comment 3           TROPONIN I      Component Value Range   Troponin I <0.30  <0.30 ng/mL  D-DIMER, QUANTITATIVE      Component Value Range   D-Dimer, Quant <0.27  0.00 - 0.48 ug/mL-FEU   Dg Chest 2 View  02/04/2012  *RADIOLOGY REPORT*  Clinical Data: Mid chest pain  CHEST - 2 VIEW  Comparison: Chest radiograph 02/08/2011  Findings: Normal heart, mediastinal, and hilar contours.  Normal pulmonary vascularity.  Trachea is midline.  The lungs are well expanded and  clear.  No change compared to recent priors.  Bony thorax is intact.  Visualized upper abdomen is unremarkable.  IMPRESSION: No acute cardiopulmonary disease.   Original Report Authenticated By: Britta Mccreedy, M.D.    Pt has been advised of the symptoms that warrant their return to the ED. Patient has voiced understanding and has agreed to follow-up with the PCP or specialist.     Dorthula Matas, PA 02/04/12 1901

## 2012-02-04 NOTE — ED Notes (Signed)
Pain  Scale  Of  5  Prior   To nitro

## 2012-02-04 NOTE — ED Provider Notes (Signed)
Chief Complaint  Patient presents with  . Chest Pain    History of Present Illness:   Adam [name] is a 46 year old male who presents today with a three-day history of substernal chest pain. This began 3 days ago while he was running around at a football game. The episode lasted about 15 minutes. Later on that night the pains returned again while he was engaged in intercourse and lasted again for 15 minutes. Yesterday he had pain lasting about 15 minutes as well also associated with exertion, and today around 11 AM while walking at work he developed substernal chest pain. The pain is almost completely gone away right now. The pain is associated with fluttering of the heart, forceful heartbeat, fatigue, lightheadedness, disorientation, confusion, dyspnea on exertion, numbness in the fingers of both hands, numbness in his jaw and gums, left facial drooping, and bilateral eyelid drooping. The pain is worse with exercise. No nausea or diaphoresis. The patient has a history of high blood pressure and his mother had her first heart attack when she was 42. No history of tobacco use, diabetes, or hyperlipidemia. He has a history of a right bundle branch block and has seen Dr. Elijah Birk wall for this in the past. He takes metoprolol for his blood pressure. He is allergic to penicillin.  Review of Systems:  Other than noted above, the patient denies any of the following symptoms. Systemic:  No fever, chills, sweats, or fatigue. ENT:  No nasal congestion, rhinorrhea, or sore throat. Pulmonary:  No cough, wheezing, shortness of breath, sputum production, hemoptysis. Cardiac:  No palpitations, rapid heartbeat, dizziness, presyncope or syncope. GI:  No abdominal pain, heartburn, nausea, or vomiting. Ext:  No leg pain or swelling.  PMFSH:  Past medical history, family history, social history, meds, and allergies were reviewed and updated as needed.   Physical Exam:   Vital signs:  BP 155/92  Pulse 96  Temp 98.7 F (37.1 C)  (Oral)  Resp 18  SpO2 97% Gen:  Alert, oriented, in no distress, skin warm and dry. Eye:  PERRL, lids and conjunctivas normal.  Sclera non-icteric. ENT:  Mucous membranes moist, pharynx clear. Neck:  Supple, no adenopathy or tenderness.  No JVD. Lungs:  Clear to auscultation, no wheezes, rales or rhonchi.  No respiratory distress. Heart:  Regular rhythm.  No gallops, murmers, clicks or rubs. Chest:  No chest wall tenderness. Abdomen:  Soft, nontender, no organomegaly or mass.  Bowel sounds normal.  No pulsatile abdominal mass or bruit. Ext:  No edema.  No calf tenderness and Homann's sign negative.  Pulses full and equal. Skin:  Warm and dry.  No rash.   EKG:   Date: 02/04/2012  Rate: 73  Rhythm: normal sinus rhythm  QRS Axis: normal  Intervals: normal  ST/T Wave abnormalities: normal  Conduction Disutrbances:none  Narrative Interpretation: Normal sinus rhythm with sinus arrhythmia, normal EKG.  Old EKG Reviewed: none available   Medications given in [facility]:  An IV was started with normal saline at 50 mL per hour and he was given nitroglycerin 0.4 mg sublingually and aspirin 325 mg by mouth. The patient was stable throughout his course here and will be transferred to the emergency department via [facility].  Assessment:  The encounter diagnosis was Unstable angina.   Plan:   1.  The following meds were prescribed:   New Prescriptions   No medications on file   2.  The patient was instructed in symptomatic care and handouts were given. 3.  The  patient was transferred to the emergency department via [facility].  Reuben Likes, MD 02/04/12 1210

## 2012-02-04 NOTE — ED Notes (Signed)
LAB HAS DRAWN BLOOD. PT AGREEABLE. AWAITING RESULTS OF BLOOD. PO FLUIDS TO PT. WILL CONTINUE TO OBSERVE.

## 2012-02-04 NOTE — ED Notes (Signed)
Pt  Reports  Had   Some  intermittant   Chest    Pain       Over  The   Weekend             He  Feels   Weak  And  Gets  Short  Of  Breath on  Exertion            He  Has       Had   Similar  Episodes in past    And   He  Reports    Was  Attributed to anxiety     And  Or   Other  Problems

## 2012-02-04 NOTE — ED Notes (Signed)
Onset 2 days ago chest pain with exertion while at work today continued chest pain, dizziness, and lightheadedness. Ax4 Seen at urgent care given one nitro tab and 324mg  aspirin stated by [facility] who transported patient. EKG NSR IV left AC 20g

## 2012-02-04 NOTE — ED Notes (Signed)
Patient moved from hallway to room.  

## 2012-02-04 NOTE — H&P (Signed)
Adam [name] is an 46 y.o. male.   Chief Complaint: shortness of breath  Cardiologist: Dr.  Valera Castle, [facility]  PCP: [place], [facility] HPI:  46 yo M with history of anxiety, HTN and presents with one week of shortness of breath on exertion. He was in his normal state of health prior to one week ago. The SOB starts whenever he does more than walk at a normal pace. The SOB is associated with substernal chest pressure and feeling like his heart is beating faster. He has symptoms during intercourse, walking up stairs, mowing the law, playing with his children. 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 Other associated symptoms include dizziness, weakness, L side of face feeling droopy and L hand tingling, blurred vision.  He presented to urgent care he was evaluated and sent here. Pt received 324 mg of ASA and a SL nitro with no improvement, but states now that he has been laying down, his symptoms are improving.   He admits to similar symptoms previously that occurred at rest. Symptoms started 10 year ago with all the same features. After a negative cardiac work up, including a negative NM Myoview in 2003 these episodes were attributed to anxiety which has been treated with metoprolol and xanax.   Past Medical History  Diagnosis Date  . Palpitations   . Family history of stress   . Prostatitis, unspecified   . Anxiety state, unspecified   . Unspecified cardiovascular disease   . IBS (irritable bowel syndrome)   . Visual disturbance   . Esophageal reflux     no meds. per pt.  . Generalized headaches   . Major depressive disorder, single episode, unspecified   . Panic disorder without agoraphobia   . Hypertension   . Coronary artery disease     pt. states Right Bundle Branch Block  . RBBB (right bundle branch block)   . Hiatal hernia     pt. denies nerve/mucscle disease  . Sleep apnea     Past Surgical History    Procedure Date  . Pyloric stenosis    . 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 Right arm     fracture--Right arm fracture s/p ORIF from roller blading.  . Esophagogastroduodenoscopy 01/03/2005    normal  . Fracture surgery 2000    left arm  . Inguinal hernia repair 03/14/2011    Procedure: LAPAROSCOPIC BILATERAL INGUINAL HERNIA REPAIR;  Surgeon: Shelly Rubenstein, MD;  Location: [facility];  Service: General;  Laterality: N/A;  with mesh  . Hernia repair 03/14/2011    BIH  . Colonoscopy 07/26/2005    normal   Family History  Problem Relation Age of Onset  . Heart attack Mother 10    PTCA  and 3 stents  . Heart disease Mother   . Hodgkin's lymphoma Maternal Grandmother   . Cancer Maternal Aunt     pt unaware of what kind  . Cancer Maternal Uncle     pt unaware of what kind  . Pyloric stenosis Daughter 18    X2  . Anxiety disorder    . Bipolar disorder Daughter    Social History:  reports that he has never smoked. He has never used smokeless tobacco. He reports that he does not drink alcohol or use illicit drugs.  Allergies:  Allergies  Allergen Reactions  . Hctz (Hydrochlorothiazide) Other (  See Comments)    Unknown reaction per patient. Per wife, pt developed joint pain and swelling (esp shoulders and elbows)  . Penicillins     REACTION: Patient reports family history of allergy, but unknown if patient was challenged and developed a reaction.   Home meds: reviewed. Takes xanax 0.5 mg q HS prn only. Last dose > 1 week ago.   Pertinent labs: Wbc 11.1 Hgb 15.2 CMP wnl POC trop neg x 1.   Studies:  EKG: NSR. Unchanged from previous EKGs.  Dg Chest 2 View  02/04/2012  *RADIOLOGY REPORT*  Clinical Data: Mid chest pain  CHEST - 2 VIEW  Comparison: Chest radiograph 02/08/2011  Findings: Normal heart, mediastinal, and hilar contours.  Normal pulmonary vascularity.  Trachea is midline.  The lungs are well expanded and clear.  No change  compared to recent priors.  Bony thorax is intact.  Visualized upper abdomen is unremarkable.  IMPRESSION: No acute cardiopulmonary disease.   Original Report Authenticated By: Britta Mccreedy, M.D.    ROS RESP: orthopnea (stable).  GI: admits to constipation. Denies nausea, vomiting, diarrhea.  GU: denies dysuria.  MSK: muscles cramps, calf pains at night.  NEURO/PSYCH: sleeps 4 hrs per night. Anxiety. Stress with children.   Blood pressure 126/88, pulse 71, resp. rate 20, SpO2 100.00%. Physical Exam  General appearance: alert, cooperative and no distress Head: Normocephalic, 1x1 cm abrasion on top of scalp, otherwise ataumatic Eyes: conjunctivae/corneas clear. PERRL, EOM's intact.  Ears: normal TM's and external ear canals both ears Throat: lips, mucosa, and tongue normal; teeth and gums normal Neck: no adenopathy, no carotid bruit, no JVD, supple, symmetrical, trachea midline and thyroid not enlarged, symmetric, no tenderness/mass/nodules Back: symmetric, no curvature. ROM normal. No CVA tenderness. Lungs: clear to auscultation bilaterally Chest wall: no tenderness Heart: regular rate and rhythm, S1, S2 normal, no murmur, click, rub or gallop Abdomen: soft, non-tender; bowel sounds normal; no masses,  no organomegaly Extremities: extremities normal, atraumatic, no cyanosis or edema Pulses: 2+ and symmetric Skin: Skin color, texture, turgor normal. No rashes or lesions Neurologic: Alert and oriented X 3, normal strength and tone. Normal coordination.  Assessment/Plan 77 yo M with a 10 year history of anxiety and chest pain presents with one week of exertional chest pain and shortness of breath.   1. Chest pain and SOB:  Differentials in order of most likely include anxiety, ACS/unstable angina, PE, decompensated CHF.  His 10 year CHD score is  3%. ACS/unstable angina unlikely given no improvement with nitro, unchanged EKG and negative troponins. PE unlikely given Wells score of 0.  Decompensated CHF unlikely given normal CXR and no evidence fluid overload on exam.  P: -discussed admission vs rule out in ED with another set of troponins and D dimer with the patient, my attending and the EDP. All in agreement with ED rule out with close outpatient follow up. -trop x 1 more set, D dimer- if negative home.  -nitroglycerin for home -outpatient follow up [facility]: Alben Spittle 02/07/12 at 9:10 AM.  [facility]: McGill 02/07/11 at 2:30 PM -FMTS will sign off. Please reconsult for admission, if labs abnormal or patient's condition changes.   Castor Gittleman 02/04/2012, 4:31 PM

## 2012-02-05 ENCOUNTER — Ambulatory Visit: Payer: Self-pay

## 2012-02-05 NOTE — H&P (Signed)
FMTS Attending Daily Note:  Adam Don MD  [phone] pager  Family Practice pager:  [phone]  I have reviewed this patient and the chart. I have discussed this patient with the resident Dr. Armen Pickup  I agree with her findings, assessment and care plan.    46 yo Male with chronic exertional angina x 10 years.  Negative D-dimer and negative troponins in ED x 2.  Patient has followup already scheduled with cardiology later this week.  Discharged with nitroglycerin for home usage.  Not currently component of recent unstable angina, he had this in past with negative cardiac workup at that time. [name]

## 2012-02-06 NOTE — ED Provider Notes (Signed)
Medical screening examination/treatment/procedure(s) were performed by non-physician practitioner and as supervising physician I was immediately available for consultation/collaboration.   Celene Kras, MD 02/06/12 [phone]

## 2012-02-07 ENCOUNTER — Ambulatory Visit: Payer: Self-pay | Admitting: Physician Assistant

## 2012-02-07 ENCOUNTER — Ambulatory Visit: Payer: Self-pay | Admitting: Family Medicine

## 2012-02-13 ENCOUNTER — Ambulatory Visit: Payer: Self-pay | Admitting: Family Medicine

## 2012-04-11 ENCOUNTER — Other Ambulatory Visit: Payer: Self-pay | Admitting: Family Medicine

## 2012-04-12 NOTE — Telephone Encounter (Signed)
Needs appt with PCP for further refills

## 2012-04-14 ENCOUNTER — Other Ambulatory Visit: Payer: Self-pay | Admitting: *Deleted

## 2012-05-02 IMAGING — CT CT ABD-PELV W/ CM
2 of 5 series · 17 of 46 positions shown, 19 images · IV contrast (APPLIED)
Comparison: 12/23/2006.

CLINICAL DATA: Lower abdominal pain.  Testicular pain.  History of
pyloric stenosis surgery.

CT ABDOMEN AND PELVIS WITH CONTRAST
TECHNIQUE: Multidetector CT imaging of the abdomen and pelvis was
performed using the standard protocol following bolus
administration of intravenous contrast.
Contrast: 100 ml Umnipaque-ZEE.

[Series 2: abd/pelv with 5.0 b31f st · axial · 0.63mm/px · z∈[-392,-28]mm · 14 of 83 slices shown, 16 images]
[im 5/83  soft-tissue]
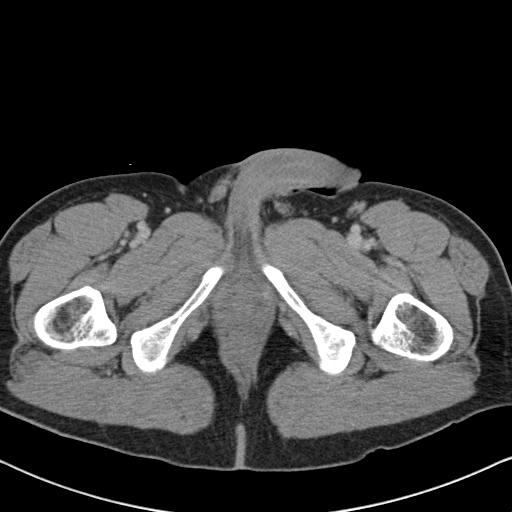
[im 5/83  bone]
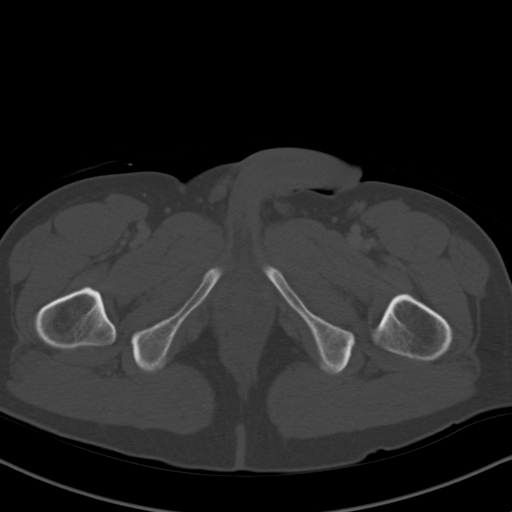
[im 9/83  soft-tissue]
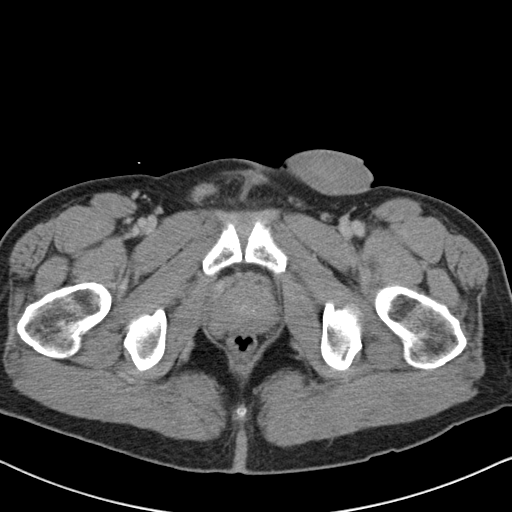
[im 18/83  soft-tissue]
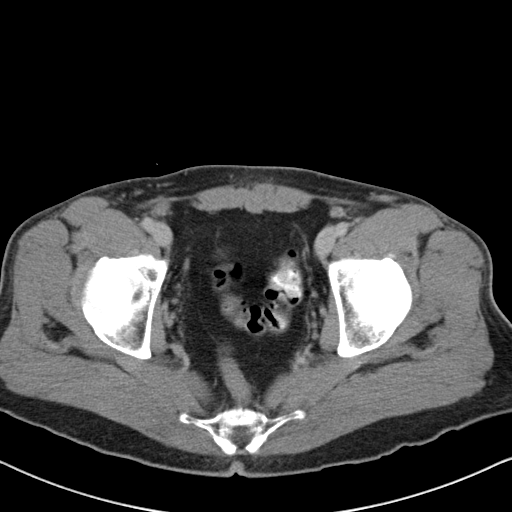
[im 22/83  soft-tissue]
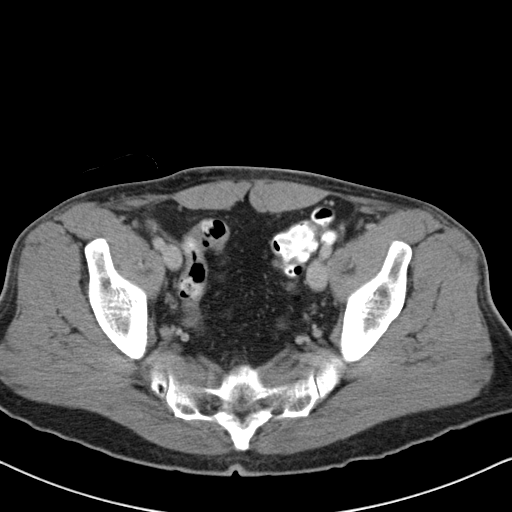
[im 26/83  soft-tissue]
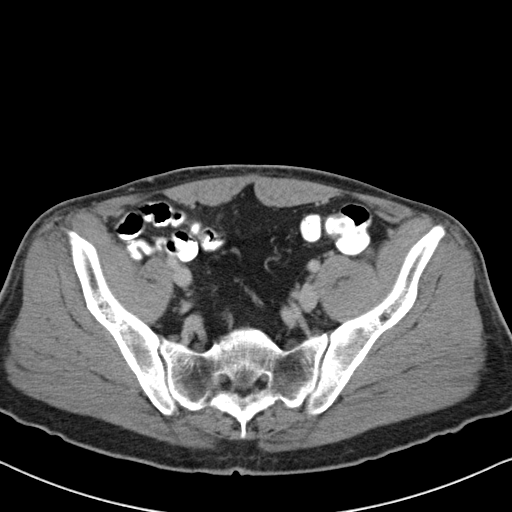
[im 35/83  soft-tissue]
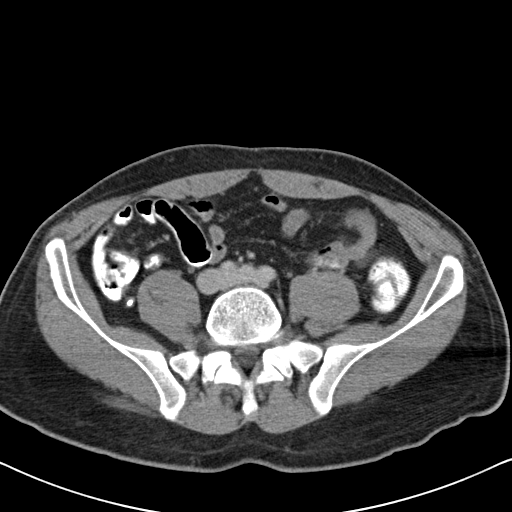
[im 39/83  soft-tissue]
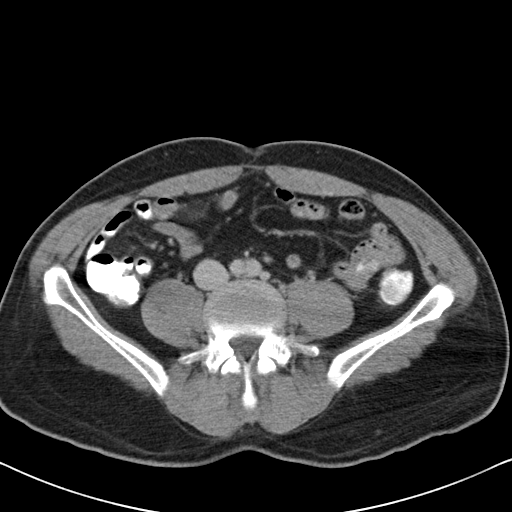
[im 44/83  soft-tissue]
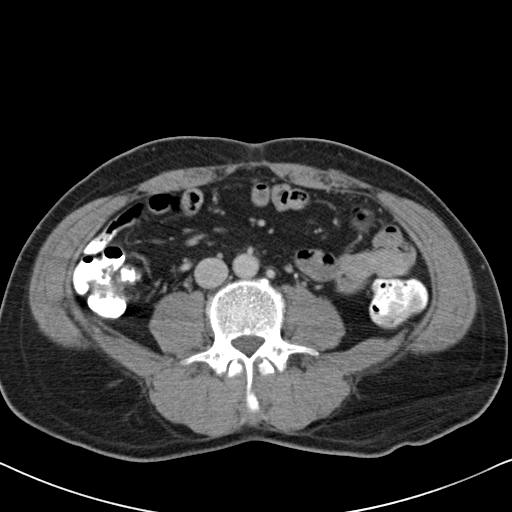
[im 48/83  soft-tissue]
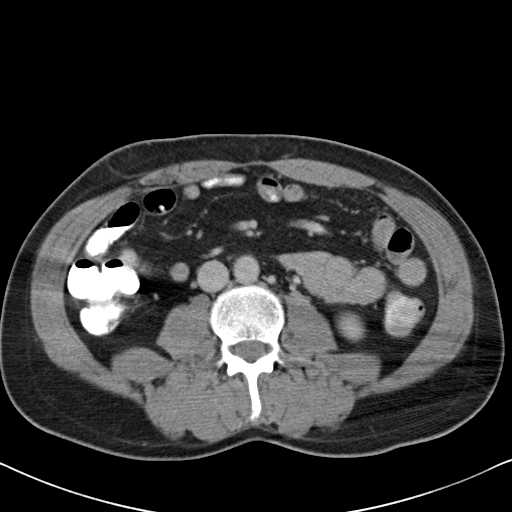
[im 48/83  bone]
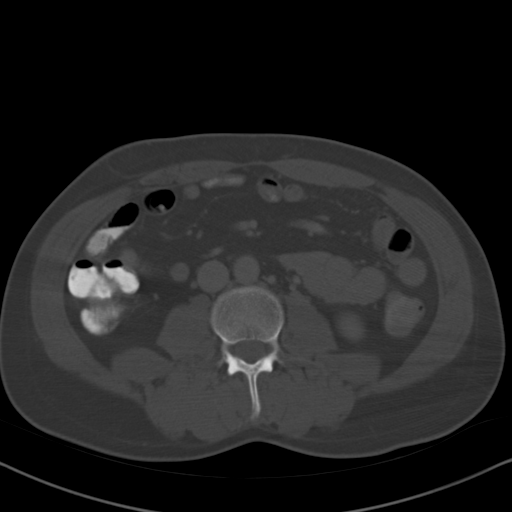
[im 57/83  soft-tissue]
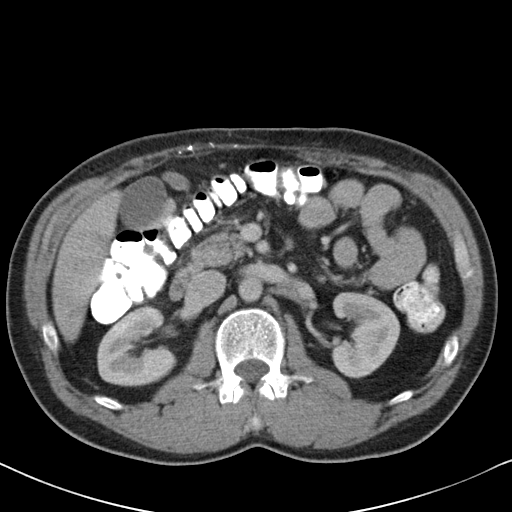
[im 61/83  soft-tissue]
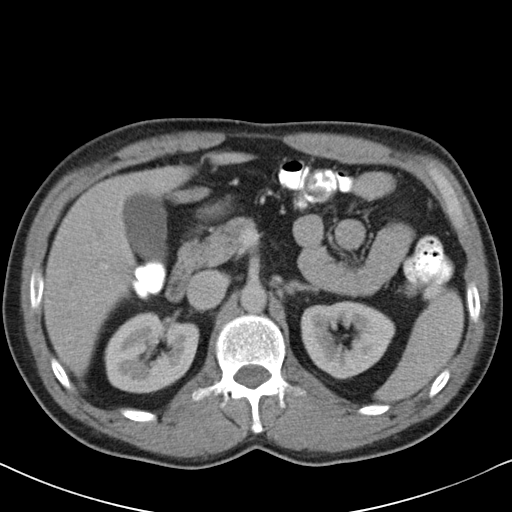
[im 65/83  soft-tissue]
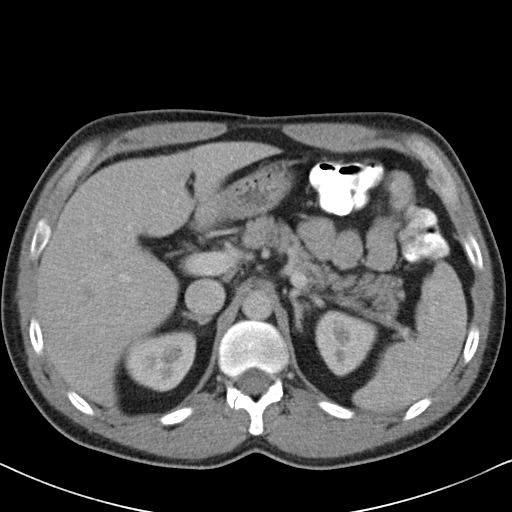
[im 74/83  soft-tissue]
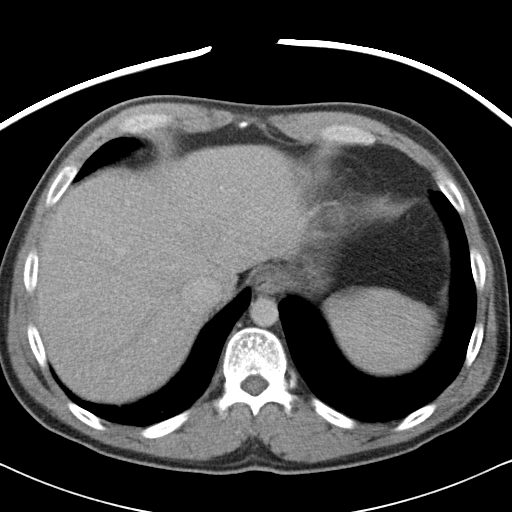
[im 78/83  soft-tissue]
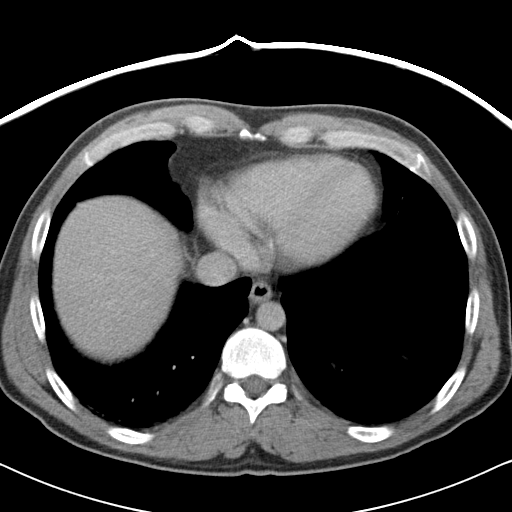

[Series 602: coronals · coronal · 0.81mm/px · 3 of 71 slices shown]
[im 24/71  soft-tissue]
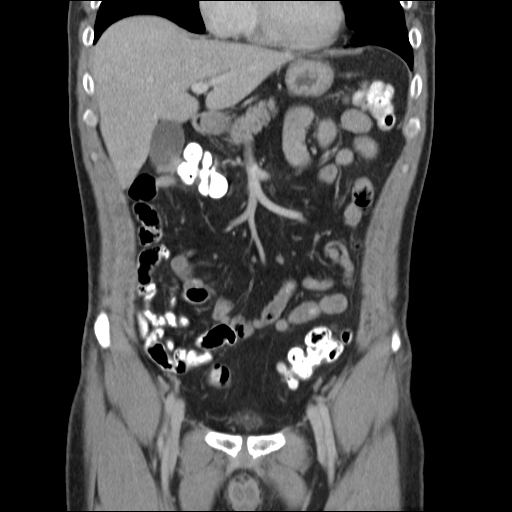
[im 32/71  soft-tissue]
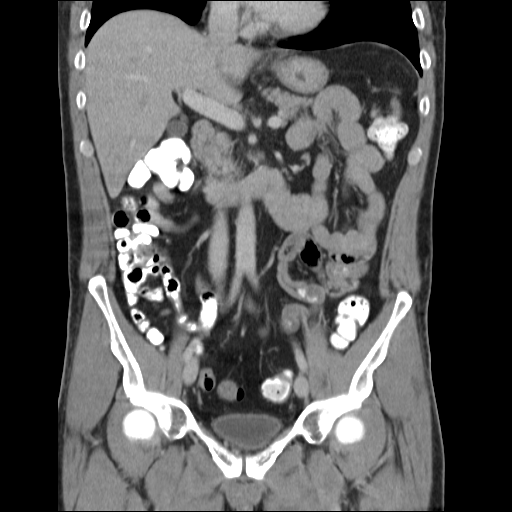
[im 39/71  soft-tissue]
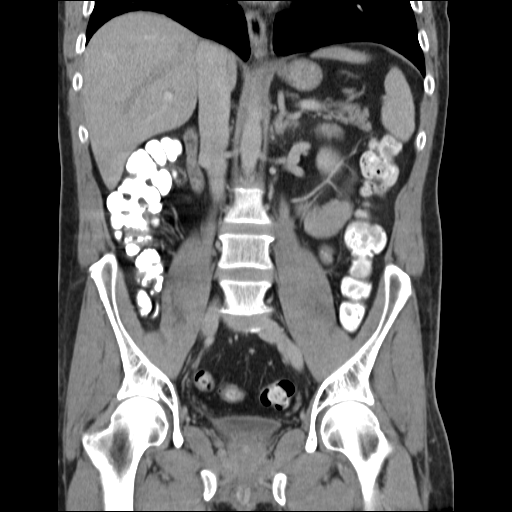

[17 of 46 positions shown; findings below may reference images not displayed]

FINDINGS: Lung bases demonstrate dependent atelectasis.  The liver
and gallbladder appear within normal limits.  Postoperative changes
are noted in the upper abdominal wall overlying the right upper
quadrant.  The adrenal glands appear normal.  Spleen normal.
Pancreas and common bile duct appear normal.  Stomach and duodenum
within normal limits.  Abdominal vasculature is unremarkable.
Normal delayed excretion of contrast in the kidneys bilaterally.

Normal appendix in the right lower quadrant.  Both ureters appear
normal.  Urinary bladder is unremarkable.  No free fluid.  No
lymphadenopathy.  Mild colonic diverticulosis.  Both inguinal
regions appear within normal limits.  No hernia is identified.
IMPRESSION: No acute abnormality in the CT abdomen and pelvis.  Normal
appendix.  Postoperative changes in the right upper quadrant
abdominal wall.

## 2012-06-19 ENCOUNTER — Other Ambulatory Visit: Payer: Self-pay | Admitting: Family Medicine

## 2012-06-20 NOTE — Telephone Encounter (Signed)
Needs appt with PCP for further refills  Will not fill again.

## 2012-07-24 ENCOUNTER — Other Ambulatory Visit: Payer: Self-pay | Admitting: Family Medicine

## 2012-07-29 ENCOUNTER — Telehealth: Payer: Self-pay | Admitting: Family Medicine

## 2012-07-29 NOTE — Telephone Encounter (Signed)
Patient called earlier wanting to know why his refill request was denied and I let him know that his doctor wants him to make an appt.  He was last seen, last June by Dr. Denyse Amass, never by Dr. Fara Boros.  I attempted to schedule him and he said that he doesn't have insurance and has a hard enough time affording his medications.  I offered to bring him in for $20, but he he said forget it and hung up.  He called back asking to speak to the Director because he doesn't understand why he has to see his doctor to get refills on meds he has taken for years and years.  I let him know that Dr. Fara Boros has never seen him and that is another reason we would need to bring him back in.  I let him know that they usually want to see the patients between 3 - 6 months on meds that are regularly taken to make sure they are still doing the job.  He was frustrated because he was never told that he would have to be seen that often and he wasn't told that his doctor had changed.

## 2012-07-30 MED ORDER — METOPROLOL SUCCINATE ER 50 MG PO TB24: ORAL_TABLET | ORAL | Status: DC

## 2012-07-30 NOTE — Telephone Encounter (Signed)
Returned call to patient.  Patient informed that his last office visit with Dr. Fara Boros was 06/27/11 and he last saw Dr. Denyse Amass 10/2011.  Patient informed that he will need follow-up appt for BP to monitor his BP, meds, and possible labs.  Appt scheduled with Dr. Fara Boros for 08/06/12 at 2:45 pm.  Patient states he is out of metoprolol.  Will check with Dr. Fara Boros for possible refill of meds to last until appt next week.  Will call patient back later today.  Gaylene Brooks, RN

## 2012-07-30 NOTE — Telephone Encounter (Signed)
Patient informed of message from Dr. Fara Boros.  Gaylene Brooks, RN

## 2012-07-30 NOTE — Telephone Encounter (Signed)
Will give enough to last until his appointment, at which time I will give him a 3-6 month supply, depending on his blood pressure.  Thanks!

## 2012-08-06 ENCOUNTER — Ambulatory Visit (INDEPENDENT_AMBULATORY_CARE_PROVIDER_SITE_OTHER): Payer: Self-pay | Admitting: Family Medicine

## 2012-08-06 ENCOUNTER — Encounter: Payer: Self-pay | Admitting: Family Medicine

## 2012-08-06 VITALS — BP 127/68 | HR 72 | Temp 99.5°F | Ht 66.0 in | Wt 151.0 lb

## 2012-08-06 DIAGNOSIS — I10 Essential (primary) hypertension: Secondary | ICD-10-CM

## 2012-08-06 DIAGNOSIS — R0602 Shortness of breath: Secondary | ICD-10-CM

## 2012-08-06 MED ORDER — METOPROLOL SUCCINATE ER 25 MG PO TB24: 25.0000 mg | ORAL_TABLET | Freq: Every day | ORAL | Status: DC

## 2012-08-06 NOTE — Progress Notes (Signed)
S: Pt comes in today for follow up/med refill.  HYPERTENSION BP: 127/68 Meds: metoprolol XR 25mg  qd Taking meds: Yes     # of doses missed/week: 0 Symptoms: Headache: No Dizziness: No Vision changes: No SOB:  Yes : see below Chest pain: No LE swelling: No Tobacco use: No  Shortness of breath: feels SOB all of the time, feels like he is being squeezed; has been going on, unchanged for >10 years. Worse with exertion, eating, and anxiety. Xanax helps; albuterol does not- just makes him feel jittery; ranitidine does not help/change it.  Had normal PFTs 08/2011, Rx'ed QVAR by Dr. Raymondo Band but never filled the Rx Would rather deal with SOB than start any medicine at this time.     ROS: Per HPI  History  Smoking status  . Never Smoker   Smokeless tobacco  . Never Used    O:  Filed Vitals:   08/06/12 1457  BP: 127/68  Pulse: 72  Temp: 99.5 F (37.5 C)    Gen: NAD CV: RRR, no murmur Pulm: CTA bilat, no wheezes, rales, or crackles Ext: Warm, no edema   A/P: 47 y.o. male p/w HTN -See problem list -f/u in 6 months

## 2012-08-06 NOTE — Assessment & Plan Note (Addendum)
BP well controlled, continue 25mg  metoprolol XR- offered pt 25mg  tab vs doing 1/2 of 50mg  tab like he is doing now- he would prefer 25mg  tab (dose was decreased due to bradycardia)

## 2012-08-06 NOTE — Patient Instructions (Addendum)
It was good to see you today.  I am giving you a 1 year supply of your medicine.  Come back in 6 months so we can check your blood pressure, heart rate, and breathing symptoms.

## 2012-08-06 NOTE — Assessment & Plan Note (Signed)
Remains unchanged, pt did nto fill QVAR. Nml PFTs 1 year ago. Continue to monitor. Likely related to his anxiety- albuterol, ranitidine do not make better, only xanax helps.

## 2012-08-11 ENCOUNTER — Other Ambulatory Visit: Payer: Self-pay | Admitting: Family Medicine

## 2012-08-11 DIAGNOSIS — F411 Generalized anxiety disorder: Secondary | ICD-10-CM

## 2012-08-11 DIAGNOSIS — I10 Essential (primary) hypertension: Secondary | ICD-10-CM

## 2012-08-11 MED ORDER — METOPROLOL SUCCINATE ER 50 MG PO TB24: ORAL_TABLET | ORAL | Status: DC

## 2012-08-11 NOTE — Telephone Encounter (Signed)
Changed back to Metoprolol 50 ER due to it being cheaper than 25mg  tab. Will continue 1/2 tab po  Daily and given 90 day supply (#45).

## 2012-09-22 ENCOUNTER — Ambulatory Visit: Payer: Self-pay | Admitting: Family Medicine

## 2012-10-08 ENCOUNTER — Ambulatory Visit: Payer: Self-pay | Admitting: Family Medicine

## 2012-10-10 ENCOUNTER — Emergency Department (HOSPITAL_COMMUNITY)
Admission: EM | Admit: 2012-10-10 | Discharge: 2012-10-11 | Disposition: A | Discharge status: Home / Self Care | Payer: No Typology Code available for payment source | Attending: Emergency Medicine | Admitting: Emergency Medicine

## 2012-10-10 ENCOUNTER — Encounter (HOSPITAL_COMMUNITY): Payer: Self-pay | Admitting: Emergency Medicine

## 2012-10-10 DIAGNOSIS — K589 Irritable bowel syndrome without diarrhea: Secondary | ICD-10-CM | POA: Insufficient documentation

## 2012-10-10 DIAGNOSIS — H539 Unspecified visual disturbance: Secondary | ICD-10-CM | POA: Insufficient documentation

## 2012-10-10 DIAGNOSIS — K5900 Constipation, unspecified: Secondary | ICD-10-CM | POA: Insufficient documentation

## 2012-10-10 DIAGNOSIS — F410 Panic disorder [episodic paroxysmal anxiety] without agoraphobia: Secondary | ICD-10-CM | POA: Insufficient documentation

## 2012-10-10 DIAGNOSIS — F411 Generalized anxiety disorder: Secondary | ICD-10-CM | POA: Insufficient documentation

## 2012-10-10 DIAGNOSIS — G4730 Sleep apnea, unspecified: Secondary | ICD-10-CM | POA: Insufficient documentation

## 2012-10-10 DIAGNOSIS — Z8719 Personal history of other diseases of the digestive system: Secondary | ICD-10-CM | POA: Insufficient documentation

## 2012-10-10 DIAGNOSIS — Z79899 Other long term (current) drug therapy: Secondary | ICD-10-CM | POA: Insufficient documentation

## 2012-10-10 DIAGNOSIS — F329 Major depressive disorder, single episode, unspecified: Secondary | ICD-10-CM | POA: Insufficient documentation

## 2012-10-10 DIAGNOSIS — K219 Gastro-esophageal reflux disease without esophagitis: Secondary | ICD-10-CM | POA: Insufficient documentation

## 2012-10-10 DIAGNOSIS — Z8679 Personal history of other diseases of the circulatory system: Secondary | ICD-10-CM | POA: Insufficient documentation

## 2012-10-10 DIAGNOSIS — R110 Nausea: Secondary | ICD-10-CM | POA: Insufficient documentation

## 2012-10-10 DIAGNOSIS — I10 Essential (primary) hypertension: Secondary | ICD-10-CM | POA: Insufficient documentation

## 2012-10-10 DIAGNOSIS — Z87448 Personal history of other diseases of urinary system: Secondary | ICD-10-CM | POA: Insufficient documentation

## 2012-10-10 DIAGNOSIS — Z880 Allergy status to penicillin: Secondary | ICD-10-CM | POA: Insufficient documentation

## 2012-10-10 DIAGNOSIS — R1031 Right lower quadrant pain: Secondary | ICD-10-CM | POA: Insufficient documentation

## 2012-10-10 LAB — CBC WITH DIFFERENTIAL/PLATELET
Basophils Absolute: 0.1 10*3/uL (ref 0.0–0.1)
Basophils Relative: 1 % (ref 0–1)
Eosinophils Absolute: 0.3 10*3/uL (ref 0.0–0.7)
Hemoglobin: 14.9 g/dL (ref 13.0–17.0)
MCH: 28.5 pg (ref 26.0–34.0)
MCHC: 35.8 g/dL (ref 30.0–36.0)
Monocytes Absolute: 0.6 10*3/uL (ref 0.1–1.0)
Monocytes Relative: 9 % (ref 3–12)
Neutrophils Relative %: 53 % (ref 43–77)
RDW: 12 % (ref 11.5–15.5)

## 2012-10-10 LAB — COMPREHENSIVE METABOLIC PANEL
ALT: 24 U/L (ref 0–53)
Alkaline Phosphatase: 88 U/L (ref 39–117)
CO2: 26 mEq/L (ref 19–32)
Chloride: 102 mEq/L (ref 96–112)
GFR calc Af Amer: >90 mL/min (ref >90)
Glucose, Bld: 109 mg/dL — ABNORMAL HIGH (ref 70–99)
Potassium: 3 mEq/L — ABNORMAL LOW (ref 3.5–5.1)
Sodium: 139 mEq/L (ref 135–145)
Total Protein: 7.3 g/dL (ref 6.0–8.3)

## 2012-10-10 LAB — POCT I-STAT TROPONIN I

## 2012-10-10 MED ORDER — MORPHINE SULFATE 4 MG/ML IJ SOLN
4.0000 mg | Freq: Once | INTRAMUSCULAR | Status: AC
  Administered 2012-10-11: 4 mg via INTRAVENOUS
  Filled 2012-10-10: qty 1

## 2012-10-10 MED ORDER — ONDANSETRON HCL 4 MG/2ML IJ SOLN
4.0000 mg | Freq: Once | INTRAMUSCULAR | Status: AC
  Administered 2012-10-11: 4 mg via INTRAVENOUS
  Filled 2012-10-10: qty 2

## 2012-10-10 MED ORDER — SODIUM CHLORIDE 0.9 % IV BOLUS (SEPSIS)
500.0000 mL | Freq: Once | INTRAVENOUS | Status: AC
  Administered 2012-10-11: 500 mL via INTRAVENOUS

## 2012-10-10 MED ORDER — IOHEXOL 300 MG/ML  SOLN
20.0000 mL | INTRAMUSCULAR | Status: DC
  Administered 2012-10-10: 50 mL via ORAL

## 2012-10-10 NOTE — ED Notes (Signed)
PT. REPORTS RIGHT ABDOMINAL PAIN WITH NAUSEA AND CHEST " BURNING ' ONSET THIS EVENING , DENIES SOB OR EMESIS.

## 2012-10-10 NOTE — ED Provider Notes (Signed)
History     CSN: [id number]  Arrival date & time 10/10/12  2059   First MD Initiated Contact with Patient 10/10/12 2258      Chief Complaint  Patient presents with  . Abdominal Pain    (Consider location/radiation/quality/duration/timing/severity/associated sxs/prior treatment) Patient is a 47 y.o. male presenting with abdominal pain. The history is provided by the patient and medical records. No language interpreter was used.  Abdominal Pain This is a chronic problem. The current episode started today. The problem occurs constantly. Progression since onset: worsened today. Associated symptoms include abdominal pain and nausea. Pertinent negatives include no anorexia, arthralgias, change in bowel habit, chest pain, chills, congestion, coughing, diaphoresis, fatigue, fever, headaches, joint swelling, myalgias, neck pain, numbness, rash, sore throat, swollen glands, urinary symptoms, vertigo, visual change, vomiting or weakness. Nothing aggravates the symptoms. He has tried nothing for the symptoms.    Adam [name] is a 47 y.o. male  with a hx of IBS and chronic abdominal pain, GERD, anxiety presents to the Emergency Department complaining of gradual, persistent, progressively worsening Right sided abdominal pain beginning at 7pm.  Described as sharp, close to his pelvis.  Pt also with Hx of bilateral inguinal hernia repair  ([facility]) and pyloric stenosis repair as an infant. Associated symptoms include nausea, panic attack.  Applying pressure makes it better and nothing makes it worse.  Pt denies fever, chills, headache, chest pain, SOB, vomiting, diarrhea, weakness, dizziness, dysuria, penile discharge, testicular pain.      Past Medical History  Diagnosis Date  . Palpitations   . Family history of stress   . Prostatitis, unspecified   . Anxiety state, unspecified   . Unspecified cardiovascular disease   . IBS (irritable bowel syndrome)   . Visual disturbance   . Esophageal reflux       no meds. per pt.  . Generalized headaches   . Major depressive disorder, single episode, unspecified   . Panic disorder without agoraphobia   . Hypertension   . RBBB (right bundle branch block)   . Hiatal hernia     pt. denies nerve/mucscle disease  . Sleep apnea     Past Surgical History  Procedure Laterality Date  . Pyloric stenosis     . 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 Right arm      fracture--Right arm fracture s/p ORIF from roller blading.  . Esophagogastroduodenoscopy  01/03/2005    normal  . Fracture surgery  2000    left arm  . Inguinal hernia repair  03/14/2011    Procedure: LAPAROSCOPIC BILATERAL INGUINAL HERNIA REPAIR;  Surgeon: Shelly Rubenstein, MD;  Location: [facility];  Service: General;  Laterality: N/A;  with mesh  . Hernia repair  03/14/2011    BIH  . Colonoscopy  07/26/2005    normal    Family History  Problem Relation Age of Onset  . Heart attack Mother 31    PTCA  and 3 stents  . Heart disease Mother   . Hodgkin's lymphoma Maternal Grandmother   . Cancer Maternal Aunt     pt unaware of what kind  . Cancer Maternal Uncle     pt unaware of what kind  . Pyloric stenosis Daughter 37    X2  . Anxiety disorder    . Bipolar disorder Daughter     History  Substance Use Topics  . Smoking status: Never Smoker   . Smokeless tobacco:  Never Used  . Alcohol Use: No      Review of Systems  Constitutional: Negative for fever, chills, diaphoresis, appetite change, fatigue and unexpected weight change.  HENT: Negative for congestion, sore throat, mouth sores, trouble swallowing, neck pain and neck stiffness.   Respiratory: Negative for cough, chest tightness, shortness of breath, wheezing and stridor.   Cardiovascular: Negative for chest pain and palpitations.  Gastrointestinal: Positive for nausea, abdominal pain and constipation. Negative for vomiting, diarrhea, blood in stool, abdominal distention, rectal  pain, anorexia and change in bowel habit.  Genitourinary: Negative for dysuria, urgency, frequency, hematuria, flank pain and difficulty urinating.  Musculoskeletal: Negative for myalgias, back pain, joint swelling and arthralgias. Gait problem: chronic.  Skin: Negative for rash.  Neurological: Negative for vertigo, weakness, numbness and headaches.  Hematological: Negative for adenopathy.  Psychiatric/Behavioral: Negative for confusion.  All other systems reviewed and are negative.    Allergies  Hctz and Penicillins  Home Medications   Current Outpatient Rx  Name  Route  Sig  Dispense  Refill  . albuterol (PROVENTIL HFA;VENTOLIN HFA) 108 (90 BASE) MCG/ACT inhaler   Inhalation   Inhale 2 puffs into the lungs daily as needed for wheezing or shortness of breath.         . ALPRAZolam (XANAX) 0.5 MG tablet   Oral   Take 0.5 mg by mouth at bedtime as needed. For sleep         . b complex vitamins tablet   Oral   Take 1 tablet by mouth daily with lunch.         . metoprolol succinate (TOPROL-XL) 50 MG 24 hr tablet   Oral   Take 25 mg by mouth daily. Take with or immediately following a meal.         . Multiple Vitamin (MULTIVITAMIN WITH MINERALS) TABS   Oral   Take 1 tablet by mouth daily with lunch.         [name] OVER THE COUNTER MEDICATION   Oral   Take 1 capsule by mouth daily with lunch. Digestive aid           BP 132/80  Pulse 57  Temp(Src) 98.2 F (36.8 C) (Oral)  Resp 18  SpO2 99%  Physical Exam  Nursing note and vitals reviewed. Constitutional: He is oriented to person, place, and time. He appears well-developed and well-nourished. No distress.  HENT:  Head: Normocephalic and atraumatic.  Nose: No mucosal edema or rhinorrhea.  Mouth/Throat: Uvula is midline, oropharynx is clear and moist and mucous membranes are normal. Mucous membranes are not dry. No edematous. No oropharyngeal exudate, posterior oropharyngeal edema, posterior oropharyngeal  erythema or tonsillar abscesses.  Eyes: Conjunctivae and EOM are normal. Pupils are equal, round, and reactive to light. No scleral icterus.  Neck: Normal range of motion.  Cardiovascular: Normal rate, regular rhythm, S1 normal, S2 normal, normal heart sounds and intact distal pulses.   Pulses:      Radial pulses are 2+ on the right side, and 2+ on the left side.       Dorsalis pedis pulses are 2+ on the right side, and 2+ on the left side.       Posterior tibial pulses are 2+ on the right side, and 2+ on the left side.  Pulmonary/Chest: Effort normal and breath sounds normal. No accessory muscle usage. Not tachypneic. No respiratory distress. He has no decreased breath sounds. He has no wheezes. He has no rhonchi. He has no rales.  He exhibits no tenderness and no bony tenderness.  Abdominal: Soft. Bowel sounds are normal. He exhibits no distension and no mass. There is generalized tenderness. There is guarding. There is no rigidity, no rebound, no CVA tenderness, no tenderness at McBurney's point and negative Murphy's sign.    Visible laparoscopic scars  Musculoskeletal: Normal range of motion. He exhibits no edema and no tenderness.  Lymphadenopathy:    He has no cervical adenopathy.  Neurological: He is alert and oriented to person, place, and time. He exhibits normal muscle tone. Coordination normal.  Skin: Skin is warm and dry. No rash noted. He is not diaphoretic. No erythema.  Psychiatric: He has a normal mood and affect. His behavior is normal.    ED Course  Procedures (including critical care time)  Labs Reviewed  COMPREHENSIVE METABOLIC PANEL - Abnormal; Notable for the following:    Potassium 3.0 (*)    Glucose, Bld 109 (*)    GFR calc non Af Amer 80 (*)    All other components within normal limits  URINALYSIS, ROUTINE W REFLEX MICROSCOPIC - Abnormal; Notable for the following:    APPearance CLOUDY (*)    Specific Gravity, Urine 1.031 (*)    Ketones, ur 15 (*)    All  other components within normal limits  CBC WITH DIFFERENTIAL  LIPASE, BLOOD  POCT I-STAT TROPONIN I   Ct Abdomen Pelvis W Contrast  10/11/2012   *RADIOLOGY REPORT*  Clinical Data: Right lower quadrant abdominal pain and nausea. Chest burning.  CT ABDOMEN AND PELVIS WITH CONTRAST  Technique:  Multidetector CT imaging of the abdomen and pelvis was performed following the standard protocol during bolus administration of intravenous contrast.  Contrast: OMNIPAQUE IOHEXOL 300 MG/ML  SOLN  Comparison: CT of the abdomen and pelvis performed 03/15/2011  Findings: The visualized lung bases are clear.  The liver and spleen are unremarkable in appearance.  The gallbladder is within normal limits.  The pancreas and adrenal glands are unremarkable.  The kidneys are unremarkable in appearance.  There is no evidence of hydronephrosis.  No renal or ureteral stones are seen.  No perinephric stranding is appreciated.  No free fluid is identified.  The small bowel is unremarkable in appearance.  The stomach is within normal limits.  No acute vascular abnormalities are seen.  The appendix is normal in caliber, without evidence for appendicitis.  Scattered diverticulosis is noted along the proximal and mid sigmoid colon; the colon is otherwise unremarkable in appearance.  The bladder is mildly distended and grossly unremarkable in appearance.  There is unusual prominence of the vasculature on the right side of the prostate, though it remains normal in size.  This appearance is grossly unchanged from 2007 and appears to reflect the patient's baseline.  No inguinal lymphadenopathy is seen.  No acute osseous abnormalities are identified.  IMPRESSION:  1.  No acute abnormality seen within the abdomen or pelvis. 2.  Scattered diverticulosis along the proximal and mid sigmoid colon, without evidence of diverticulitis.   Original Report Authenticated By: Tonia Ghent, M.D.     1. Abdominal pain, RLQ   2. IBS (irritable bowel  syndrome)- Constipation Predominant   3. GASTROESOPHAGEAL REFLUX, NO ESOPHAGITIS    ECG:  Date: 10/11/2012  Rate: 82  Rhythm: normal sinus rhythm  QRS Axis: normal  Intervals: normal  ST/T Wave abnormalities: normal  Conduction Disutrbances:none  Narrative Interpretation: nonischemic ECG, unchanged from previous  Old EKG Reviewed: unchanged     MDM  Homero Fellers  A Lizotte presents with acute exacerbation of his chronic abdominal pain.  Patient is nontoxic, nonseptic appearing, in no apparent distress.  Patient's pain and other symptoms adequately managed in emergency department.  Fluid bolus given.  Labs, imaging and vitals reviewed.  Patient does not meet the SIRS or Sepsis criteria.  On repeat exam patient does not have a surgical abdomin and there are nor peritoneal signs.  No indication of appendicitis, bowel obstruction, bowel perforation, cholecystitis, diverticulitis.  Patient discharged home with symptomatic treatment and given strict instructions for follow-up with their primary care physician.  I have also discussed reasons to return immediately to the ER.  Patient expresses understanding and agrees with plan.          Dahlia Client Prestina Raigoza, PA-C 10/11/12 0129  Dahlia Client Caralynn Gelber, PA-C 10/11/12 1610

## 2012-10-11 ENCOUNTER — Emergency Department (HOSPITAL_COMMUNITY): Payer: No Typology Code available for payment source

## 2012-10-11 ENCOUNTER — Encounter (HOSPITAL_COMMUNITY): Payer: Self-pay | Admitting: Radiology

## 2012-10-11 LAB — URINALYSIS, ROUTINE W REFLEX MICROSCOPIC
Bilirubin Urine: NEGATIVE
Hgb urine dipstick: NEGATIVE
Specific Gravity, Urine: 1.031 — ABNORMAL HIGH (ref 1.005–1.030)
Urobilinogen, UA: 0.2 mg/dL (ref 0.0–1.0)

## 2012-10-11 MED ORDER — IOHEXOL 300 MG/ML  SOLN
100.0000 mL | Freq: Once | INTRAMUSCULAR | Status: AC | PRN
  Administered 2012-10-11: 100 mL via INTRAVENOUS

## 2012-10-12 NOTE — ED Provider Notes (Signed)
Medical screening examination/treatment/procedure(s) were performed by non-physician practitioner and as supervising physician I was immediately available for consultation/collaboration.   Joya Gaskins, MD 10/12/12 [phone]

## 2012-11-20 ENCOUNTER — Other Ambulatory Visit: Payer: Self-pay | Admitting: Family Medicine

## 2012-11-20 MED ORDER — METOPROLOL SUCCINATE ER 25 MG PO TB24: 25.0000 mg | ORAL_TABLET | Freq: Every day | ORAL | Status: DC

## 2012-11-20 NOTE — Telephone Encounter (Signed)
Will fwd to MD.  Farren Nelles L, CMA  

## 2012-11-20 NOTE — Telephone Encounter (Signed)
Pt requesting a refill on his BP medication Metorolol sent his pharmacy on request JW

## 2012-11-24 ENCOUNTER — Telehealth: Payer: Self-pay | Admitting: Family Medicine

## 2012-11-24 DIAGNOSIS — I10 Essential (primary) hypertension: Secondary | ICD-10-CM

## 2012-11-24 MED ORDER — METOPROLOL SUCCINATE ER 50 MG PO TB24
25.0000 mg | ORAL_TABLET | Freq: Every day | ORAL | Status: DC
Start: 2012-11-24 — End: 2013-08-21

## 2012-11-24 NOTE — Telephone Encounter (Signed)
Sent in ne Rx for metoprolol XL 50mg  1/2 tablet (25mg ) daily.  Will forward to Arlyss Repress to let patient know Rx was sent.

## 2012-11-24 NOTE — Telephone Encounter (Signed)
Called pt. See last OV note from Dr.McGill. I explained her note to pt. Pt request Metoprolol 50 mg (he will break it in half due to cost) pt said, that he has done this before. He did not pick up the 25 mg tabs, because he wants the 50 mg.  I told the pt, that I would ask Dr.Losq and advised him to schedule OV with her (new PCP) Pt agreed and will do that. Fwd. To Dr.Losq for refill. Thank you. Lorenda Hatchet, [name]

## 2012-11-24 NOTE — Telephone Encounter (Signed)
Pt is calling because he want's to have metoprolol 50 mg instead of 25 mg. He said that Dr. Fara Boros was supposed to change this. JW

## 2012-11-25 NOTE — Telephone Encounter (Signed)
Tried to call pt, number busy. Pt will check pharmacy. Adam [name], [name]

## 2012-12-05 ENCOUNTER — Ambulatory Visit: Payer: Self-pay | Admitting: Family Medicine

## 2013-05-11 ENCOUNTER — Ambulatory Visit: Payer: Self-pay

## 2013-08-20 ENCOUNTER — Encounter (HOSPITAL_COMMUNITY): Payer: Self-pay | Admitting: Emergency Medicine

## 2013-08-20 ENCOUNTER — Emergency Department (HOSPITAL_COMMUNITY)
Admission: EM | Admit: 2013-08-20 | Discharge: 2013-08-21 | Disposition: A | Discharge status: Home / Self Care | Payer: Self-pay | Attending: Emergency Medicine | Admitting: Emergency Medicine

## 2013-08-20 DIAGNOSIS — Z8669 Personal history of other diseases of the nervous system and sense organs: Secondary | ICD-10-CM | POA: Insufficient documentation

## 2013-08-20 DIAGNOSIS — Z8659 Personal history of other mental and behavioral disorders: Secondary | ICD-10-CM | POA: Insufficient documentation

## 2013-08-20 DIAGNOSIS — Z880 Allergy status to penicillin: Secondary | ICD-10-CM | POA: Insufficient documentation

## 2013-08-20 DIAGNOSIS — B9789 Other viral agents as the cause of diseases classified elsewhere: Secondary | ICD-10-CM | POA: Insufficient documentation

## 2013-08-20 DIAGNOSIS — Z79899 Other long term (current) drug therapy: Secondary | ICD-10-CM | POA: Insufficient documentation

## 2013-08-20 DIAGNOSIS — Z8719 Personal history of other diseases of the digestive system: Secondary | ICD-10-CM | POA: Insufficient documentation

## 2013-08-20 DIAGNOSIS — I10 Essential (primary) hypertension: Secondary | ICD-10-CM | POA: Insufficient documentation

## 2013-08-20 DIAGNOSIS — R6889 Other general symptoms and signs: Secondary | ICD-10-CM

## 2013-08-20 DIAGNOSIS — B349 Viral infection, unspecified: Secondary | ICD-10-CM

## 2013-08-20 DIAGNOSIS — J111 Influenza due to unidentified influenza virus with other respiratory manifestations: Secondary | ICD-10-CM | POA: Insufficient documentation

## 2013-08-20 DIAGNOSIS — Z87448 Personal history of other diseases of urinary system: Secondary | ICD-10-CM | POA: Insufficient documentation

## 2013-08-20 NOTE — ED Notes (Signed)
?   Flu symptoms: bad h/a's, back aches, whole body aches; throat and ears starting to hurt, nauseated, feeling drained.

## 2013-08-21 LAB — URINALYSIS, ROUTINE W REFLEX MICROSCOPIC
Bilirubin Urine: NEGATIVE
GLUCOSE, UA: NEGATIVE mg/dL
Hgb urine dipstick: NEGATIVE
Ketones, ur: NEGATIVE mg/dL
LEUKOCYTES UA: NEGATIVE
Nitrite: NEGATIVE
PROTEIN: NEGATIVE mg/dL
SPECIFIC GRAVITY, URINE: 1.016 (ref 1.005–1.030)
Urobilinogen, UA: 0.2 mg/dL (ref 0.0–1.0)
pH: 5 (ref 5.0–8.0)

## 2013-08-21 LAB — CBC WITH DIFFERENTIAL/PLATELET
BASOS ABS: 0 10*3/uL (ref 0.0–0.1)
BASOS PCT: 0 % (ref 0–1)
EOS ABS: 0.1 10*3/uL (ref 0.0–0.7)
EOS PCT: 2 % (ref 0–5)
HEMATOCRIT: 44.2 % (ref 39.0–52.0)
HEMOGLOBIN: 15.3 g/dL (ref 13.0–17.0)
Lymphocytes Relative: 12 % (ref 12–46)
Lymphs Abs: 1 10*3/uL (ref 0.7–4.0)
MCH: 28.7 pg (ref 26.0–34.0)
MCHC: 34.6 g/dL (ref 30.0–36.0)
MCV: 82.9 fL (ref 78.0–100.0)
MONO ABS: 1.2 10*3/uL — AB (ref 0.1–1.0)
MONOS PCT: 15 % — AB (ref 3–12)
Neutro Abs: 6 10*3/uL (ref 1.7–7.7)
Neutrophils Relative %: 71 % (ref 43–77)
Platelets: 196 10*3/uL (ref 150–400)
RBC: 5.33 MIL/uL (ref 4.22–5.81)
RDW: 12.5 % (ref 11.5–15.5)
WBC: 8.4 10*3/uL (ref 4.0–10.5)

## 2013-08-21 LAB — COMPREHENSIVE METABOLIC PANEL
ALBUMIN: 3.2 g/dL — AB (ref 3.5–5.2)
ALT: 12 U/L (ref 0–53)
AST: 13 U/L (ref 0–37)
Alkaline Phosphatase: 88 U/L (ref 39–117)
BILIRUBIN TOTAL: 0.2 mg/dL — AB (ref 0.3–1.2)
BUN: 11 mg/dL (ref 6–23)
CALCIUM: 9 mg/dL (ref 8.4–10.5)
CHLORIDE: 97 meq/L (ref 96–112)
CO2: 21 mEq/L (ref 19–32)
CREATININE: 0.96 mg/dL (ref 0.50–1.35)
GFR calc Af Amer: >90 mL/min (ref >90)
GFR calc non Af Amer: >90 mL/min (ref >90)
Glucose, Bld: 157 mg/dL — ABNORMAL HIGH (ref 70–99)
Potassium: 3.6 mEq/L — ABNORMAL LOW (ref 3.7–5.3)
Sodium: 133 mEq/L — ABNORMAL LOW (ref 137–147)
TOTAL PROTEIN: 7.1 g/dL (ref 6.0–8.3)

## 2013-08-21 MED ORDER — IBUPROFEN 800 MG PO TABS: 800.0000 mg | ORAL_TABLET | Freq: Four times a day (QID) | ORAL | Status: DC | PRN

## 2013-08-21 MED ORDER — OXYCODONE-ACETAMINOPHEN 5-325 MG PO TABS: 2.0000 | ORAL_TABLET | Freq: Four times a day (QID) | ORAL | Status: DC | PRN

## 2013-08-21 MED ORDER — OXYCODONE-ACETAMINOPHEN 5-325 MG PO TABS
2.0000 | ORAL_TABLET | Freq: Once | ORAL | Status: AC
  Administered 2013-08-21: 2 via ORAL
  Filled 2013-08-21: qty 2

## 2013-08-21 NOTE — Discharge Instructions (Signed)
Fever, Adult A fever is a higher than normal body temperature. In an adult, an oral temperature around 98.6 F (37 C) is considered normal. A temperature of 100.4 F (38 C) or higher is generally considered a fever. Mild or moderate fevers generally have no long-term effects and often do not require treatment. Extreme fever (greater than or equal to 106 F or 41.1 C) can cause seizures. The sweating that may occur with repeated or prolonged fever may cause dehydration. Elderly people can develop confusion during a fever. A measured temperature can vary with:  Age.  Time of day.  Method of measurement (mouth, underarm, rectal, or ear). The fever is confirmed by taking a temperature with a thermometer. Temperatures can be taken different ways. Some methods are accurate and some are not.  An oral temperature is used most commonly. Electronic thermometers are fast and accurate.  An ear temperature will only be accurate if the thermometer is positioned as recommended by the manufacturer.  A rectal temperature is accurate and done for those adults who have a condition where an oral temperature cannot be taken.  An underarm (axillary) temperature is not accurate and not recommended. Fever is a symptom, not a disease.  CAUSES   Infections commonly cause fever.  Some noninfectious causes for fever include:  Some arthritis conditions.  Some thyroid or adrenal gland conditions.  Some immune system conditions.  Some types of cancer.  A medicine reaction.  High doses of certain street drugs such as methamphetamine.  Dehydration.  Exposure to high outside or room temperatures.  Occasionally, the source of a fever cannot be determined. This is sometimes called a "fever of unknown origin" (FUO).  Some situations may lead to a temporary rise in body temperature that may go away on its own. Examples are:  Childbirth.  Surgery.  Intense exercise. HOME CARE INSTRUCTIONS   Take  appropriate medicines for fever. Follow dosing instructions carefully. If you use acetaminophen to reduce the fever, be careful to avoid taking other medicines that also contain acetaminophen. Do not take aspirin for a fever if you are younger than age 59. There is an association with Reye's syndrome. Reye's syndrome is a rare but potentially deadly disease.  If an infection is present and antibiotics have been prescribed, take them as directed. Finish them even if you start to feel better.  Rest as needed.  Maintain an adequate fluid intake. To prevent dehydration during an illness with prolonged or recurrent fever, you may need to drink extra fluid.Drink enough fluids to keep your urine clear or pale yellow.  Sponging or bathing with room temperature water may help reduce body temperature. Do not use ice water or alcohol sponge baths.  Dress comfortably, but do not over-bundle. SEEK MEDICAL CARE IF:   You are unable to keep fluids down.  You develop vomiting or diarrhea.  You are not feeling at least partly better after 3 days.  You develop new symptoms or problems. SEEK IMMEDIATE MEDICAL CARE IF:   You have shortness of breath or trouble breathing.  You develop excessive weakness.  You are dizzy or you faint.  You are extremely thirsty or you are making little or no urine.  You develop new pain that was not there before (such as in the head, neck, chest, back, or abdomen).  You have persistant vomiting and diarrhea for more than 1 to 2 days.  You develop a stiff neck or your eyes become sensitive to light.  You develop a  skin rash.  You have a fever or persistent symptoms for more than 2 to 3 days.  You have a fever and your symptoms suddenly get worse. MAKE SURE YOU:   Understand these instructions.  Will watch your condition.  Will get help right away if you are not doing well or get worse. Document Released: 10/17/2000 Document Revised: 07/16/2011 Document  Reviewed: 02/22/2011 [facility] Patient Information 2014 [place], [place].  Viral Infections A viral infection can be caused by different types of viruses.Most viral infections are not serious and resolve on their own. However, some infections may cause severe symptoms and may lead to further complications. SYMPTOMS Viruses can frequently cause:  Minor sore throat.  Aches and pains.  Headaches.  Runny nose.  Different types of rashes.  Watery eyes.  Tiredness.  Cough.  Loss of appetite.  Gastrointestinal infections, resulting in nausea, vomiting, and diarrhea. These symptoms do not respond to antibiotics because the infection is not caused by bacteria. However, you might catch a bacterial infection following the viral infection. This is sometimes called a "superinfection." Symptoms of such a bacterial infection may include:  Worsening sore throat with pus and difficulty swallowing.  Swollen neck glands.  Chills and a high or persistent fever.  Severe headache.  Tenderness over the sinuses.  Persistent overall ill feeling (malaise), muscle aches, and tiredness (fatigue).  Persistent cough.  Yellow, green, or brown mucus production with coughing. HOME CARE INSTRUCTIONS   Only take over-the-counter or prescription medicines for pain, discomfort, diarrhea, or fever as directed by your caregiver.  Drink enough water and fluids to keep your urine clear or pale yellow. Sports drinks can provide valuable electrolytes, sugars, and hydration.  Get plenty of rest and maintain proper nutrition. Soups and broths with crackers or rice are fine. SEEK IMMEDIATE MEDICAL CARE IF:   You have severe headaches, shortness of breath, chest pain, neck pain, or an unusual rash.  You have uncontrolled vomiting, diarrhea, or you are unable to keep down fluids.  You or your child has an oral temperature above 102 F (38.9 C), not controlled by medicine.  Your baby is older than 3 months  with a rectal temperature of 102 F (38.9 C) or higher.  Your baby is 84 months old or younger with a rectal temperature of 100.4 F (38 C) or higher. MAKE SURE YOU:   Understand these instructions.  Will watch your condition.  Will get help right away if you are not doing well or get worse. Document Released: 01/31/2005 Document Revised: 07/16/2011 Document Reviewed: 08/28/2010 [facility] Patient Information 2014 [place], [place].

## 2013-08-21 NOTE — ED Provider Notes (Signed)
CSN: [id number]     Arrival date & time 08/20/13  1933 History   First MD Initiated Contact with Patient 08/21/13 0002     Chief Complaint  Patient presents with  . Influenza     (Consider location/radiation/quality/duration/timing/severity/associated sxs/prior Treatment) HPI 48 year old male presents to the emergency department from home with complaint of 2 days of myalgias, headache, fever, to 101.5, nausea, ear, and throat pain.  He denies any known sick contacts.  He did not receive a flu shot this year.  Patient denies any vomiting.  He has been taking aspirin without improvement in his symptoms.  No neck stiffness, no confusion.  No travel outside the country, no tick bites or mosquito bites.  No rashes. Past Medical History  Diagnosis Date  . Palpitations   . Family history of stress   . Prostatitis, unspecified   . Anxiety state, unspecified   . Unspecified cardiovascular disease   . IBS (irritable bowel syndrome)   . Visual disturbance   . Esophageal reflux     no meds. per pt.  . Generalized headaches   . Major depressive disorder, single episode, unspecified   . Panic disorder without agoraphobia   . Hypertension   . RBBB (right bundle branch block)   . Hiatal hernia     pt. denies nerve/mucscle disease  . Sleep apnea    Past Surgical History  Procedure Laterality Date  . Pyloric stenosis     . 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 Right arm      fracture--Right arm fracture s/p ORIF from roller blading.  . Esophagogastroduodenoscopy  01/03/2005    normal  . Fracture surgery  2000    left arm  . Inguinal hernia repair  03/14/2011    Procedure: LAPAROSCOPIC BILATERAL INGUINAL HERNIA REPAIR;  Surgeon: Harl Bowie, MD;  Location: [facility];  Service: General;  Laterality: N/A;  with mesh  . Hernia repair  03/14/2011    BIH  . Colonoscopy  07/26/2005    normal   Family History  Problem Relation Age of Onset  . Heart  attack Mother 5    PTCA  and 3 stents  . Heart disease Mother   . Hodgkin's lymphoma Maternal Grandmother   . Cancer Maternal Aunt     pt unaware of what kind  . Cancer Maternal Uncle     pt unaware of what kind  . Pyloric stenosis Daughter 69    X2  . Anxiety disorder    . Bipolar disorder Daughter    History  Substance Use Topics  . Smoking status: Never Smoker   . Smokeless tobacco: Never Used  . Alcohol Use: No    Review of Systems   See History of Present Illness; otherwise all other systems are reviewed and negative  Allergies  Hctz and Penicillins  Home Medications   Prior to Admission medications   Medication Sig Start Date End Date Taking? Authorizing Provider  metoprolol succinate (TOPROL-XL) 25 MG 24 hr tablet Take 25 mg by mouth daily.   Yes Historical Provider, MD   BP 138/88  Pulse 88  Temp(Src) 99.5 F (37.5 C) (Oral)  Resp 20  Wt 154 lb 1 oz (69.882 kg)  SpO2 99% Physical Exam  Nursing note and vitals reviewed. Constitutional: He is oriented to person, place, and time. He appears well-developed and well-nourished. He appears distressed (uncomfortable appearing).  HENT:  Head: Normocephalic and atraumatic.  Right Ear: External ear normal.  Left Ear: External ear normal.  Nose: Nose normal.  Mouth/Throat: Oropharynx is clear and moist.  Eyes: Conjunctivae and EOM are normal. Pupils are equal, round, and reactive to light.  Neck: Normal range of motion. Neck supple. No JVD present. No tracheal deviation present. No thyromegaly present.  Neck supple.  No signs of meningitis.  Negative Koerner and Brudzinski's sign.  Full range of motion  Cardiovascular: Normal rate, regular rhythm, normal heart sounds and intact distal pulses.  Exam reveals no gallop and no friction rub.   No murmur heard. Pulmonary/Chest: Effort normal and breath sounds normal. No stridor. No respiratory distress. He has no wheezes. He has no rales. He exhibits no tenderness.   Abdominal: Soft. Bowel sounds are normal. He exhibits no distension and no mass. There is no tenderness. There is no rebound and no guarding.  Musculoskeletal: Normal range of motion. He exhibits no edema and no tenderness.  Lymphadenopathy:    He has no cervical adenopathy.  Neurological: He is alert and oriented to person, place, and time. He has normal reflexes. No cranial nerve deficit. He exhibits normal muscle tone. Coordination normal.  Skin: Skin is warm and dry. No rash noted. No erythema. No pallor.  Psychiatric: He has a normal mood and affect. His behavior is normal. Judgment and thought content normal.    ED Course  Procedures (including critical care time) Labs Review Labs Reviewed  CBC WITH DIFFERENTIAL - Abnormal; Notable for the following:    Monocytes Relative 15 (*)    Monocytes Absolute 1.2 (*)    All other components within normal limits  COMPREHENSIVE METABOLIC PANEL - Abnormal; Notable for the following:    Sodium 133 (*)    Potassium 3.6 (*)    Glucose, Bld 157 (*)    Albumin 3.2 (*)    Total Bilirubin 0.2 (*)    All other components within normal limits  RESPIRATORY VIRUS PANEL  URINALYSIS, ROUTINE W REFLEX MICROSCOPIC    Imaging Review No results found.   EKG Interpretation None      MDM   Final diagnoses:  Flu-like symptoms  Viral syndrome    48 year old male with what sounds to be a viral syndrome.  Will check baseline labs, as he reports he has taken upwards of 20 tablets of aspirin over the last 24-hour period.  No signs of strep.  Exam negative for meningitis.  Supportive care encouraged, patient received Percocet here for his myalgias, and fever.    Kalman Drape, MD 08/21/13 [phone]

## 2013-08-22 ENCOUNTER — Emergency Department (HOSPITAL_COMMUNITY): Payer: Self-pay

## 2013-08-22 ENCOUNTER — Encounter (HOSPITAL_COMMUNITY): Payer: Self-pay | Admitting: Emergency Medicine

## 2013-08-22 DIAGNOSIS — Z8669 Personal history of other diseases of the nervous system and sense organs: Secondary | ICD-10-CM | POA: Insufficient documentation

## 2013-08-22 DIAGNOSIS — Z87448 Personal history of other diseases of urinary system: Secondary | ICD-10-CM | POA: Insufficient documentation

## 2013-08-22 DIAGNOSIS — Z8659 Personal history of other mental and behavioral disorders: Secondary | ICD-10-CM | POA: Insufficient documentation

## 2013-08-22 DIAGNOSIS — Z880 Allergy status to penicillin: Secondary | ICD-10-CM | POA: Insufficient documentation

## 2013-08-22 DIAGNOSIS — J159 Unspecified bacterial pneumonia: Secondary | ICD-10-CM | POA: Insufficient documentation

## 2013-08-22 DIAGNOSIS — Z8719 Personal history of other diseases of the digestive system: Secondary | ICD-10-CM | POA: Insufficient documentation

## 2013-08-22 DIAGNOSIS — I10 Essential (primary) hypertension: Secondary | ICD-10-CM | POA: Insufficient documentation

## 2013-08-22 DIAGNOSIS — M549 Dorsalgia, unspecified: Secondary | ICD-10-CM | POA: Insufficient documentation

## 2013-08-22 DIAGNOSIS — Z79899 Other long term (current) drug therapy: Secondary | ICD-10-CM | POA: Insufficient documentation

## 2013-08-22 DIAGNOSIS — R51 Headache: Secondary | ICD-10-CM | POA: Insufficient documentation

## 2013-08-22 LAB — CBC
HEMATOCRIT: 43 % (ref 39.0–52.0)
Hemoglobin: 15.5 g/dL (ref 13.0–17.0)
MCH: 29.2 pg (ref 26.0–34.0)
MCHC: 36 g/dL (ref 30.0–36.0)
MCV: 81 fL (ref 78.0–100.0)
PLATELETS: 242 10*3/uL (ref 150–400)
RBC: 5.31 MIL/uL (ref 4.22–5.81)
RDW: 12.1 % (ref 11.5–15.5)
WBC: 7 10*3/uL (ref 4.0–10.5)

## 2013-08-22 LAB — BASIC METABOLIC PANEL
BUN: 19 mg/dL (ref 6–23)
CHLORIDE: 95 meq/L — AB (ref 96–112)
CO2: 22 meq/L (ref 19–32)
Calcium: 9.6 mg/dL (ref 8.4–10.5)
Creatinine, Ser: 1.27 mg/dL (ref 0.50–1.35)
GFR calc non Af Amer: 65 mL/min — ABNORMAL LOW (ref >90)
GFR, EST AFRICAN AMERICAN: 76 mL/min — AB (ref >90)
Glucose, Bld: 115 mg/dL — ABNORMAL HIGH (ref 70–99)
POTASSIUM: 3.9 meq/L (ref 3.7–5.3)
Sodium: 134 mEq/L — ABNORMAL LOW (ref 137–147)

## 2013-08-22 LAB — RESPIRATORY VIRUS PANEL
Adenovirus: NOT DETECTED
INFLUENZA A H3: NOT DETECTED
INFLUENZA B 1: NOT DETECTED
Influenza A H1: NOT DETECTED
Influenza A: NOT DETECTED
METAPNEUMOVIRUS: NOT DETECTED
PARAINFLUENZA 1 A: NOT DETECTED
PARAINFLUENZA 3 A: NOT DETECTED
Parainfluenza 2: NOT DETECTED
RESPIRATORY SYNCYTIAL VIRUS A: NOT DETECTED
RHINOVIRUS: NOT DETECTED
Respiratory Syncytial Virus B: NOT DETECTED

## 2013-08-22 LAB — I-STAT TROPONIN, ED: TROPONIN I, POC: 0.01 ng/mL (ref 0.00–0.08)

## 2013-08-22 NOTE — ED Notes (Signed)
Patient with chest pain and shortness of breath.  The pain started this evening.  Patient states he also having headaches with nausea.

## 2013-08-23 ENCOUNTER — Emergency Department (HOSPITAL_COMMUNITY)
Admission: EM | Admit: 2013-08-23 | Discharge: 2013-08-23 | Disposition: A | Discharge status: Home / Self Care | Payer: Self-pay | Attending: Emergency Medicine | Admitting: Emergency Medicine

## 2013-08-23 DIAGNOSIS — R51 Headache: Secondary | ICD-10-CM

## 2013-08-23 DIAGNOSIS — R519 Headache, unspecified: Secondary | ICD-10-CM

## 2013-08-23 DIAGNOSIS — M791 Myalgia, unspecified site: Secondary | ICD-10-CM

## 2013-08-23 DIAGNOSIS — M549 Dorsalgia, unspecified: Secondary | ICD-10-CM

## 2013-08-23 DIAGNOSIS — J189 Pneumonia, unspecified organism: Secondary | ICD-10-CM

## 2013-08-23 MED ORDER — LEVOFLOXACIN 750 MG PO TABS: 750.0000 mg | ORAL_TABLET | Freq: Every day | ORAL | Status: DC

## 2013-08-23 MED ORDER — MORPHINE SULFATE 4 MG/ML IJ SOLN
4.0000 mg | Freq: Once | INTRAMUSCULAR | Status: DC
  Filled 2013-08-23: qty 1

## 2013-08-23 MED ORDER — HYDROCODONE-ACETAMINOPHEN 5-325 MG PO TABS
2.0000 | ORAL_TABLET | Freq: Once | ORAL | Status: AC
  Administered 2013-08-23: 2 via ORAL
  Filled 2013-08-23: qty 2

## 2013-08-23 MED ORDER — HYDROCODONE-ACETAMINOPHEN 5-325 MG PO TABS: 1.0000 | ORAL_TABLET | ORAL | Status: DC | PRN

## 2013-08-23 MED ORDER — SODIUM CHLORIDE 0.9 % IV BOLUS (SEPSIS)
1000.0000 mL | Freq: Once | INTRAVENOUS | Status: AC
  Administered 2013-08-23: 1000 mL via INTRAVENOUS

## 2013-08-23 MED ORDER — KETOROLAC TROMETHAMINE 30 MG/ML IJ SOLN
30.0000 mg | Freq: Once | INTRAMUSCULAR | Status: DC
  Filled 2013-08-23: qty 1

## 2013-08-23 MED ORDER — LEVOFLOXACIN IN D5W 750 MG/150ML IV SOLN
750.0000 mg | Freq: Once | INTRAVENOUS | Status: AC
  Administered 2013-08-23: 750 mg via INTRAVENOUS
  Filled 2013-08-23: qty 150

## 2013-08-23 NOTE — ED Provider Notes (Signed)
CSN: [id number]     Arrival date & time 08/22/13  2221 History   First MD Initiated Contact with Patient 08/23/13 [phone]     Chief Complaint  Patient presents with  . Chest Pain  . Shortness of Breath     (Consider location/radiation/quality/duration/timing/severity/associated sxs/prior Treatment) HPI Patient is a is a 48 yo man who presents with 4d of headache, myalgias, malaise and fever with Tm of 102F.  Patient denies cough. He denies SOB and chest pain. He complains of diffuse back pain and body aches. He reports normal po intake. He was seen in this ED a couple of days ago and says he was diagnosed with a viral illness.   Patient denies any focal neurologic deficits. His headache is diffuse and throbbing. 7/10. Nothing makes it worse or better. No photosensitivity.   Past Medical History  Diagnosis Date  . Palpitations   . Family history of stress   . Prostatitis, unspecified   . Anxiety state, unspecified   . Unspecified cardiovascular disease   . IBS (irritable bowel syndrome)   . Visual disturbance   . Esophageal reflux     no meds. per pt.  . Generalized headaches   . Major depressive disorder, single episode, unspecified   . Panic disorder without agoraphobia   . Hypertension   . RBBB (right bundle branch block)   . Hiatal hernia     pt. denies nerve/mucscle disease  . Sleep apnea    Past Surgical History  Procedure Laterality Date  . Pyloric stenosis     . 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 Right arm      fracture--Right arm fracture s/p ORIF from roller blading.  . Esophagogastroduodenoscopy  01/03/2005    normal  . Fracture surgery  2000    left arm  . Inguinal hernia repair  03/14/2011    Procedure: LAPAROSCOPIC BILATERAL INGUINAL HERNIA REPAIR;  Surgeon: Harl Bowie, MD;  Location: [facility];  Service: General;  Laterality: N/A;  with mesh  . Hernia repair  03/14/2011    BIH  . Colonoscopy  07/26/2005   normal   Family History  Problem Relation Age of Onset  . Heart attack Mother 56    PTCA  and 3 stents  . Heart disease Mother   . Hodgkin's lymphoma Maternal Grandmother   . Cancer Maternal Aunt     pt unaware of what kind  . Cancer Maternal Uncle     pt unaware of what kind  . Pyloric stenosis Daughter 67    X2  . Anxiety disorder    . Bipolar disorder Daughter    History  Substance Use Topics  . Smoking status: Never Smoker   . Smokeless tobacco: Never Used  . Alcohol Use: No    Review of Systems Ten point review of symptoms performed and is negative with the exception of symptoms noted above.     Allergies  Hctz and Penicillins  Home Medications   Prior to Admission medications   Medication Sig Start Date End Date Taking? Authorizing Provider  ibuprofen (ADVIL,MOTRIN) 800 MG tablet Take 1 tablet (800 mg total) by mouth every 6 (six) hours as needed for fever, headache or moderate pain. 08/21/13  Yes Kalman Drape, MD  metoprolol succinate (TOPROL-XL) 25 MG 24 hr tablet Take 25 mg by mouth daily.   Yes Historical Provider, MD  oxyCODONE-acetaminophen (PERCOCET/ROXICET) 5-325 MG per tablet Take 2  tablets by mouth every 6 (six) hours as needed for severe pain. 08/21/13  Yes Kalman Drape, MD   BP 148/102  Pulse 92  Temp(Src) 98.5 F (36.9 C) (Oral)  Resp 18  SpO2 99% Physical Exam Gen: well developed and well nourished appearing Head: NCAT Eyes: PERL, EOMI Nose: no epistaixis or rhinorrhea Mouth/throat: mucosa is moist and pink Neck: supple, no stridor Lungs: CTA B, no wheezing, rhonchi or rales CV: RRR, no murmur, extremities appear well perfused.  Abd: soft, notender, nondistended Back: no ttp, no cva ttp Skin: warm and dry Ext: normal to inspection, no dependent edema Neuro: CN ii-xii grossly intact, no focal deficits Psyche; normal affect,  calm and cooperative.   ED Course  Procedures (including critical care time) Labs Review  Results for orders  placed during the hospital encounter of 08/23/13 (from the past 24 hour(s))  CBC     Status: None   Collection Time    08/22/13 10:29 PM      Result Value Ref Range   WBC 7.0  4.0 - 10.5 K/uL   RBC 5.31  4.22 - 5.81 MIL/uL   Hemoglobin 15.5  13.0 - 17.0 g/dL   HCT 43.0  39.0 - 52.0 %   MCV 81.0  78.0 - 100.0 fL   MCH 29.2  26.0 - 34.0 pg   MCHC 36.0  30.0 - 36.0 g/dL   RDW 12.1  11.5 - 15.5 %   Platelets 242  150 - 400 K/uL  BASIC METABOLIC PANEL     Status: Abnormal   Collection Time    08/22/13 10:29 PM      Result Value Ref Range   Sodium 134 (*) 137 - 147 mEq/L   Potassium 3.9  3.7 - 5.3 mEq/L   Chloride 95 (*) 96 - 112 mEq/L   CO2 22  19 - 32 mEq/L   Glucose, Bld 115 (*) 70 - 99 mg/dL   BUN 19  6 - 23 mg/dL   Creatinine, Ser 1.27  0.50 - 1.35 mg/dL   Calcium 9.6  8.4 - 10.5 mg/dL   GFR calc non Af Amer 65 (*) >90 mL/min   GFR calc Af Amer 76 (*) >90 mL/min  I-STAT TROPOININ, ED     Status: None   Collection Time    08/22/13 10:49 PM      Result Value Ref Range   Troponin i, poc 0.01  0.00 - 0.08 ng/mL   Comment 3            Imaging Review Dg Chest 2 View  08/22/2013   CLINICAL DATA:  Chest pain, shortness of breath.  EXAM: CHEST  2 VIEW  COMPARISON:  02/04/2012  FINDINGS: Area of dense consolidation noted peripherally in the right upper lobe. This likely reflects pneumonia. Left lung is clear. No effusions. Heart is normal size. No acute bony abnormality.  IMPRESSION: Dense area of consolidation peripherally in the right upper lobe compatible with pneumonia.   Electronically Signed   By: Rolm Baptise M.D.   On: 08/22/2013 23:42   EKG: nsr, no acute ischemic changes, normal intervals, normal axis, normal qrs complex  MDM   DDX: viral illness, pneumonia, tension headache, migraine headache, UTI, dehydration.   CXR notable for dense consolidation peripherally in right upper lobe. The patient is feeling better, as far as his headache and myalgias go. He is asking to go  home. His VS are stable and labs are reassuring. I have counseled the  patient on the importance of outpatient f/u within the next 48 hrs and we have discussed return precautions. First dose of Levaquin given in ED.      Elyn Peers, MD 08/23/13 [phone]

## 2013-08-24 NOTE — Progress Notes (Signed)
CM consulted via phone call regarding patient recently seen in [facility] ED and stated that he was unable to fill his prescriptions due to un affordability. CM located a coupon for the prescribed Levaquin at [facility]. Discussed cost with coupon filled at a local [place] and the patient stated that he could afford the Levaquin with the coupon. Patient stated that he will go to the [facility] on [address]. Explained that the coupon will be faxed to the pharmacy. CM called the [facility] and pharmacy not yet open. Will call [facility] after 9am to obtain pharmacy fax number and inform staff of the coupon sent via fax for patient prescription. Informed patient to call back if any further difficulty with filling the Levaquin. Patient understands that there is no assist for the pain medication prescribed. Edwyna Shell, RN, BSN, [employer] 08/24/2013 8:54 AM

## 2013-08-27 ENCOUNTER — Telehealth: Payer: Self-pay | Admitting: Family Medicine

## 2013-08-27 MED ORDER — METOPROLOL SUCCINATE ER 50 MG PO TB24: ORAL_TABLET | ORAL | Status: DC

## 2013-08-27 NOTE — Telephone Encounter (Signed)
Sent in refill for metoprolol xl 50mg  take 1/2 tablet daily.  He will need to be seen in the clinic before his next refill. Please let patient know. Thank you!  [name], PGY-3 Family Medicine Resident

## 2013-08-27 NOTE — Telephone Encounter (Signed)
Refill request for Metoprolol. Patient been out of meds for 5 days now. Please refill asap

## 2013-08-27 NOTE — Telephone Encounter (Signed)
Called and informed patient of refill and that he will need another appointment for further refills. Patient says he will call next week for an appointment.Adam [name]

## 2013-08-29 LAB — CULTURE, BLOOD (ROUTINE X 2)
CULTURE: NO GROWTH
Culture: NO GROWTH

## 2013-09-14 ENCOUNTER — Encounter (HOSPITAL_COMMUNITY): Payer: Self-pay | Admitting: Emergency Medicine

## 2013-09-14 ENCOUNTER — Emergency Department (HOSPITAL_COMMUNITY)
Admission: EM | Admit: 2013-09-14 | Discharge: 2013-09-15 | Disposition: A | Discharge status: Home / Self Care | Payer: Self-pay | Attending: Emergency Medicine | Admitting: Emergency Medicine

## 2013-09-14 ENCOUNTER — Emergency Department (HOSPITAL_COMMUNITY): Payer: Self-pay

## 2013-09-14 DIAGNOSIS — Z79899 Other long term (current) drug therapy: Secondary | ICD-10-CM | POA: Insufficient documentation

## 2013-09-14 DIAGNOSIS — J189 Pneumonia, unspecified organism: Secondary | ICD-10-CM

## 2013-09-14 DIAGNOSIS — Z880 Allergy status to penicillin: Secondary | ICD-10-CM | POA: Insufficient documentation

## 2013-09-14 DIAGNOSIS — I10 Essential (primary) hypertension: Secondary | ICD-10-CM | POA: Insufficient documentation

## 2013-09-14 DIAGNOSIS — Z8669 Personal history of other diseases of the nervous system and sense organs: Secondary | ICD-10-CM | POA: Insufficient documentation

## 2013-09-14 DIAGNOSIS — Z8659 Personal history of other mental and behavioral disorders: Secondary | ICD-10-CM | POA: Insufficient documentation

## 2013-09-14 DIAGNOSIS — Z87448 Personal history of other diseases of urinary system: Secondary | ICD-10-CM | POA: Insufficient documentation

## 2013-09-14 DIAGNOSIS — Z8719 Personal history of other diseases of the digestive system: Secondary | ICD-10-CM | POA: Insufficient documentation

## 2013-09-14 DIAGNOSIS — J159 Unspecified bacterial pneumonia: Secondary | ICD-10-CM | POA: Insufficient documentation

## 2013-09-14 MED ORDER — CEFTRIAXONE SODIUM 1 G IJ SOLR
1.0000 g | Freq: Once | INTRAMUSCULAR | Status: AC
  Administered 2013-09-14: 1 g via INTRAVENOUS
  Filled 2013-09-14: qty 10

## 2013-09-14 MED ORDER — DEXAMETHASONE SODIUM PHOSPHATE 4 MG/ML IJ SOLN
8.0000 mg | Freq: Once | INTRAMUSCULAR | Status: AC
  Administered 2013-09-15: 8 mg via INTRAVENOUS
  Filled 2013-09-14: qty 2

## 2013-09-14 MED ORDER — ALBUTEROL SULFATE (2.5 MG/3ML) 0.083% IN NEBU
5.0000 mg | INHALATION_SOLUTION | Freq: Once | RESPIRATORY_TRACT | Status: AC
  Administered 2013-09-14: 5 mg via RESPIRATORY_TRACT
  Filled 2013-09-14: qty 6

## 2013-09-14 MED ORDER — IPRATROPIUM BROMIDE 0.02 % IN SOLN
0.5000 mg | RESPIRATORY_TRACT | Status: AC
  Administered 2013-09-14: 0.5 mg via RESPIRATORY_TRACT
  Filled 2013-09-14: qty 2.5

## 2013-09-14 NOTE — ED Notes (Signed)
Pt is complete with breathing treatment. Pt states feeling better.

## 2013-09-14 NOTE — ED Notes (Signed)
Per pt sts sore throat, HA, chest congestion with mucous and fatigue.

## 2013-09-15 MED ORDER — AZITHROMYCIN 250 MG PO TABS: 250.0000 mg | ORAL_TABLET | Freq: Every day | ORAL | Status: DC

## 2013-09-15 MED ORDER — GUAIFENESIN ER 1200 MG PO TB12: 1.0000 | ORAL_TABLET | Freq: Two times a day (BID) | ORAL | Status: DC

## 2013-09-15 MED ORDER — PREDNISONE 50 MG PO TABS: 50.0000 mg | ORAL_TABLET | Freq: Every day | ORAL | Status: DC

## 2013-09-15 MED ORDER — ALBUTEROL SULFATE HFA 108 (90 BASE) MCG/ACT IN AERS
2.0000 | INHALATION_SPRAY | Freq: Four times a day (QID) | RESPIRATORY_TRACT | Status: DC
  Administered 2013-09-15: 2 via RESPIRATORY_TRACT

## 2013-09-15 MED ORDER — ALBUTEROL SULFATE HFA 108 (90 BASE) MCG/ACT IN AERS
INHALATION_SPRAY | RESPIRATORY_TRACT | Status: AC
  Filled 2013-09-15: qty 6.7

## 2013-09-15 MED ORDER — PROMETHAZINE-DM 6.25-15 MG/5ML PO SYRP: 5.0000 mL | ORAL_SOLUTION | Freq: Four times a day (QID) | ORAL | Status: DC | PRN

## 2013-09-15 NOTE — Discharge Instructions (Signed)
Return here as needed. Follow up with your primary doctor. Increase your fluid intake. °

## 2013-09-19 NOTE — ED Provider Notes (Signed)
  Medical screening examination/treatment/procedure(s) were performed by non-physician practitioner and as supervising physician I was immediately available for consultation/collaboration.   EKG Interpretation None         Carmin Muskrat, MD 09/19/13 1719

## 2013-09-19 NOTE — ED Provider Notes (Signed)
CSN: [id number]     Arrival date & time 09/14/13  1631 History   First MD Initiated Contact with Patient 09/14/13 2225     Chief Complaint  Patient presents with  . Sore Throat     (Consider location/radiation/quality/duration/timing/severity/associated sxs/prior Treatment) HPI Patient presents to the emergency department with cough, sore throat, headache, and chest congestion for the last week.  Patient, states, that he is not taking any medications prior to arrival.  The patient, states, that nothing seems make his condition, better or worse.  The patient denies nausea, vomiting, diarrhea, headache,  blurred vision fever, chest pain, shortness of breath, rash abdominal pain, dysuria, weakness, dizziness, nasal congestion, numbness or back pain.  Patient, states, that he did not take any other medications prior to arrival.  Patient, states nothing seems make his condition, better or worse.  The symptoms have been persistent Past Medical History  Diagnosis Date  . Palpitations   . Family history of stress   . Prostatitis, unspecified   . Anxiety state, unspecified   . Unspecified cardiovascular disease   . IBS (irritable bowel syndrome)   . Visual disturbance   . Esophageal reflux     no meds. per pt.  . Generalized headaches   . Major depressive disorder, single episode, unspecified   . Panic disorder without agoraphobia   . Hypertension   . RBBB (right bundle branch block)   . Hiatal hernia     pt. denies nerve/mucscle disease  . Sleep apnea    Past Surgical History  Procedure Laterality Date  . Pyloric stenosis     . 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 Right arm      fracture--Right arm fracture s/p ORIF from roller blading.  . Esophagogastroduodenoscopy  01/03/2005    normal  . Fracture surgery  2000    left arm  . Inguinal hernia repair  03/14/2011    Procedure: LAPAROSCOPIC BILATERAL INGUINAL HERNIA REPAIR;  Surgeon: Harl Bowie, MD;  Location: [facility];  Service: General;  Laterality: N/A;  with mesh  . Hernia repair  03/14/2011    BIH  . Colonoscopy  07/26/2005    normal   Family History  Problem Relation Age of Onset  . Heart attack Mother 28    PTCA  and 3 stents  . Heart disease Mother   . Hodgkin's lymphoma Maternal Grandmother   . Cancer Maternal Aunt     pt unaware of what kind  . Cancer Maternal Uncle     pt unaware of what kind  . Pyloric stenosis Daughter 23    X2  . Anxiety disorder    . Bipolar disorder Daughter    History  Substance Use Topics  . Smoking status: Never Smoker   . Smokeless tobacco: Never Used  . Alcohol Use: No    Review of Systems  All other systems negative except as documented in the HPI. All pertinent positives and negatives as reviewed in the HPI.   Allergies  Hctz and Penicillins  Home Medications   Prior to Admission medications   Medication Sig Start Date End Date Taking? Authorizing Provider  metoprolol succinate (TOPROL-XL) 50 MG 24 hr tablet Take 50 mg by mouth daily. Take with or immediately following a meal.   Yes Historical Provider, MD  oxyCODONE-acetaminophen (PERCOCET/ROXICET) 5-325 MG per tablet Take 2 tablets by mouth every 6 (six) hours as needed for severe pain. 08/21/13  Yes Kalman Drape, MD  azithromycin (ZITHROMAX) 250 MG tablet Take 1 tablet (250 mg total) by mouth daily. 09/15/13   Resa Miner Lexa Coronado, PA-C  Guaifenesin 1200 MG TB12 Take 1 tablet (1,200 mg total) by mouth 2 (two) times daily. 09/15/13   Resa Miner Geofrey Silliman, PA-C  predniSONE (DELTASONE) 50 MG tablet Take 1 tablet (50 mg total) by mouth daily. 09/15/13   [place], PA-C  promethazine-dextromethorphan (PROMETHAZINE-DM) 6.25-15 MG/5ML syrup Take 5 mLs by mouth 4 (four) times daily as needed for cough. 09/15/13   Resa Miner Violetta Lavalle, PA-C   BP 123/78  Pulse 80  Temp(Src) 97.9 F (36.6 C) (Oral)  Resp 21  SpO2 100% Physical Exam  Nursing note and vitals  reviewed. Constitutional: He appears well-developed and well-nourished.  HENT:  Head: Normocephalic and atraumatic.  Mouth/Throat: Oropharynx is clear and moist.  Eyes: Pupils are equal, round, and reactive to light.  Neck: Normal range of motion. Neck supple.  Cardiovascular: Normal rate, regular rhythm and normal heart sounds.  Exam reveals no gallop and no friction rub.   No murmur heard. Pulmonary/Chest: Effort normal. No respiratory distress. He has no wheezes. He has rhonchi in the right middle field, the right lower field, the left middle field and the left lower field.  Musculoskeletal: Normal range of motion. He exhibits no edema.  Skin: Skin is warm and dry. No rash noted. No erythema.    ED Course  Procedures (including critical care time) Patient's been stable here in the emergency, department.  He'll be treated for community-acquired pneumonia.  He is given IV antibiotics and by mouth antibiotics.  For home.  The patient, states, that he was treated with a course of antibiotics previously, but did not feel like he completely better.  The patient is advised return here for any worsening in his condition.  Patient is advised followup with his primary care doctor as well.    MDM   Final diagnoses:  CAP (community acquired pneumonia)        Brent General, PA-C 09/19/13 1031

## 2013-10-21 ENCOUNTER — Telehealth: Payer: Self-pay | Admitting: Family Medicine

## 2013-10-21 MED ORDER — METOPROLOL SUCCINATE ER 50 MG PO TB24: 25.0000 mg | ORAL_TABLET | Freq: Every day | ORAL | Status: DC

## 2013-10-21 NOTE — Telephone Encounter (Signed)
Talked to patient and he needs his blood pressure meds. refilled.  He also states that his wife Marzetta Merino, DOB 12/22/1975 needs her thyroid medicine refilled.  They both have appointment on the 20 th of this month. Thanks, [employer]

## 2013-10-21 NOTE — Telephone Encounter (Signed)
Please call patient to ask him what medicines he needs refilled. I am glad to refill his blood pressure medicine. If pain medicine, I cannot refill it over the phone.  Thank you,  [name], PGY-3 Family Medicine Resident

## 2013-10-21 NOTE — Telephone Encounter (Signed)
Need to have refill on meds before provider leaves.  Scheduled an appt with new provider on July 9th .

## 2013-10-21 NOTE — Telephone Encounter (Signed)
Informed pt that meds are at pharmacy. Shelly

## 2013-10-21 NOTE — Telephone Encounter (Signed)
Ma'am, disregard the previous message about pt. Knott, Hill[place]. I informed her of her med being at the pharmacy. Thanks, [employer]

## 2013-10-21 NOTE — Telephone Encounter (Signed)
Refilled metoprolol  [name], PGY-3 Family Medicine Resident

## 2013-10-29 ENCOUNTER — Other Ambulatory Visit: Payer: Self-pay | Admitting: *Deleted

## 2013-10-29 MED ORDER — METOPROLOL SUCCINATE ER 50 MG PO TB24: 25.0000 mg | ORAL_TABLET | Freq: Every day | ORAL | Status: DC

## 2013-11-03 ENCOUNTER — Ambulatory Visit: Payer: No Typology Code available for payment source

## 2013-11-12 ENCOUNTER — Ambulatory Visit (INDEPENDENT_AMBULATORY_CARE_PROVIDER_SITE_OTHER): Payer: No Typology Code available for payment source | Admitting: Family Medicine

## 2013-11-12 ENCOUNTER — Encounter: Payer: Self-pay | Admitting: Family Medicine

## 2013-11-12 VITALS — BP 142/86 | HR 70 | Temp 98.2°F | Wt 151.0 lb

## 2013-11-12 DIAGNOSIS — I10 Essential (primary) hypertension: Secondary | ICD-10-CM

## 2013-11-12 DIAGNOSIS — R42 Dizziness and giddiness: Secondary | ICD-10-CM

## 2013-11-12 DIAGNOSIS — J189 Pneumonia, unspecified organism: Secondary | ICD-10-CM

## 2013-11-12 LAB — CBC
HCT: 44.1 % (ref 39.0–52.0)
Hemoglobin: 15.3 g/dL (ref 13.0–17.0)
MCH: 28.2 pg (ref 26.0–34.0)
MCHC: 34.7 g/dL (ref 30.0–36.0)
MCV: 81.2 fL (ref 78.0–100.0)
PLATELETS: 248 10*3/uL (ref 150–400)
RBC: 5.43 MIL/uL (ref 4.22–5.81)
RDW: 13.8 % (ref 11.5–15.5)
WBC: 8.4 10*3/uL (ref 4.0–10.5)

## 2013-11-12 NOTE — Assessment & Plan Note (Signed)
Patient continues to endorse symptoms consistent with bronchitis vs pneumonia. Given history of refractory treatment in the past and his age, will repeat a CXR.  Patient has had a history of SOB in the past and had PFTs. It was suggested that he start on Qvar by Dr. Valentina Lucks, however he did not wish to take more medications.  - Pt does have an [employer] card- advised pt to call [facility] Pont[facility] concerning the out of pocket cost of a CXR to see if he can afford it. - If symptoms persist, may approach the subject of trying Qvar. - Advised to RTC or seek medical assistance if symptoms begin to worsen.

## 2013-11-12 NOTE — Patient Instructions (Addendum)
It was nice meeting you today. As far as your pneumonia is concerned, I have placed an order for a chest X-ray at [facility] to ensure that treatment was successful. Unfortunately, the [facility] card does not currently cover imaging studies.  Please call to see how much it is if you pay cash and determine whether you can afford it. Your old echocardiogram looks good as well as your EKG.   I would like you to keep a diary of all episodes (what you're doing when it starts, your symptoms, and how long it lasts) Please follow up with me in 1 month with your diary.  In the meantime I will check a hemoglobin to ensure this is not the cause.

## 2013-11-12 NOTE — Progress Notes (Signed)
Patient ID: Adam [name], male   DOB: [birth year], 48 y.o.   MRN: [id number]  Kathrine Cords, MD  Subjective:  Chief complaint- f/u pneumonia and light-headedness/left facial dizziness   Light headedness: Pt initially endorses progressive dizziness over the last year, however later states his symptoms are more "light-headedness" as he does not feel that he or the room are spinning. He notes confusion, imbalance, and "feeling like I want to pass out" during these episodes that can last minutes to hours. Interesting, he also endorses left side facial and neck numbness and tunnel vision without facial droop or slurred speech. Symptoms are worse with movement, however they have occurred when he was at rest while driving.  Previously, symptoms occurred once every few months, but now occur on almost a daily basis. Patient denies headache, weakness, or tinnitus. He endorses aural fullness. Patient has a h/o of anxiety with PRN Xanax but denies that this is similar to his anxiety.  ROS: Denies chest pain, increased SOB, weakness, dysphagia, blurred vision, double vision, change in sensation, falls, dysarthria, syncope, heart palpitations   F/u Pneumonia: Patient notes he's continued to have cough productive of green/yellow sputum.  He denies SOB or chest pain (other than the SOB that he's have for several years). Also denies fevers/chills.  He tells me he's was treated twice since the last CXR did not show complete resolution.   Last CXR on 09/14/2013 revealed interval improvement in the right upper lobe however noted airspace disease that had not complete resolved.  ROS: Denies fevers, chills, nausea, vomiting, increased work of breathing, sore throat, earache, orthopnea Endorses paroxysmal nocturnal dyspnea   Past Medical History Patient Active Problem List   Diagnosis Date Noted  . Pneumonia 11/12/2013  . URI (upper respiratory infection) 08/13/2011  . Cough 08/09/2011  . IBS (irritable bowel  syndrome)- Constipation Predominant 06/19/2011  . Headache 05/14/2011  . Dizziness 05/14/2011  . Left ear pain 05/14/2011  . Inguinal hernia, bilateral 03/02/2011  . Bilateral inguinal hernia 02/26/2011  . Dental abscess 02/15/2011  . Shortness of breath 01/30/2011  . Fatigue 12/13/2010  . Abdominal pain 10/20/2010  . Shoulder pain, bilateral 07/31/2010  . Orthopnea 07/31/2010  . BACK PAIN, CHRONIC 06/09/2010  . UNSPECIFIED VISUAL DISTURBANCE 12/12/2009  . UNSPECIFIED CONDITION OF BRAIN 10/19/2009  . ALLERGIC RHINITIS, SEASONAL 08/25/2009  . HYPOKALEMIA, HX OF 12/30/2008  . Essential hypertension, benign 08/04/2007  . LOW BACK PAIN, CHRONIC 08/04/2007  . Mood disorder NOS 05/28/2007  . PALPITATIONS 04/25/2007  . CHEST PAIN 04/18/2007  . PROSTATITIS NOS 08/14/2006  . ANXIETY 07/04/2006  . PANIC ATTACKS 07/04/2006  . GASTROESOPHAGEAL REFLUX, NO ESOPHAGITIS 07/04/2006    Medications- reviewed and updated Current Outpatient Prescriptions  Medication Sig Dispense Refill  . metoprolol succinate (TOPROL-XL) 50 MG 24 hr tablet Take 1 tablet (50 mg total) by mouth daily. Take with or immediately following a meal.  30 tablet  1  . Guaifenesin 1200 MG TB12 Take 1 tablet (1,200 mg total) by mouth 2 (two) times daily.  20 each  0  . oxyCODONE-acetaminophen (PERCOCET/ROXICET) 5-325 MG per tablet Take 2 tablets by mouth every 6 (six) hours as needed for severe pain.  30 tablet  0  . promethazine-dextromethorphan (PROMETHAZINE-DM) 6.25-15 MG/5ML syrup Take 5 mLs by mouth 4 (four) times daily as needed for cough.  120 mL  0   No current facility-administered medications for this visit.   Patient is a past smoker (when he was a teenager)- less than 1 pack  year history. His wife smokes outside.   Objective: BP 142/86  Pulse 70  Temp(Src) 98.2 F (36.8 C) (Oral)  Wt 151 lb (68.493 kg) Gen: NAD, alert, cooperative with exam HEENT: NCAT, EOMI, PERRLA, Tympanic membranes clear,  non-erythematous, and non-bulging. CV: RRR. No murmurs, rubs or gallops noted. 2+ radial pulses bilaterally  Resp: Expiratory wheezing bilaterally with no rhonchi noted. No increased WOB. Abd: +BS present, no guarding or organomegaly Ext: No edema, warm Neuro: Alert and oriented, Speech clear, coherent, and logical. Sensation over the face, UE, and LE intact to light touch. Smile and forehead movements symmetric. Uvula and tongue midline.  Shoulder shrug and head rotation strong and equal.  5/5 strength in the upper and lower extremities bilaterally. Normal patellar and brachial DTRs.   Assessment/Plan:  Pneumonia Patient continues to endorse symptoms consistent with bronchitis vs pneumonia. Given history of refractory treatment in the past and his age, will repeat a CXR.  Patient has had a history of SOB in the past and had PFTs. It was suggested that he start on Qvar by Dr. Valentina Lucks, however he did not wish to take more medications.  - Pt does have an [employer] card- advised pt to call [facility] concerning the out of pocket cost of a CXR to see if he can afford it. - If symptoms persist, may approach the subject of trying Qvar. - Advised to RTC or seek medical assistance if symptoms begin to worsen.  Dizziness Patient most concerned because his mother has had hypertrophic cardiomyopathy. Pt had an echocardiogram in 2009 with no signs of hypertrophy and EKGs have been normal in the past- I reassured him that I was not too worried about HOCM, however will consider it in the future if there's no resolution.  I feel that this is less likely a TIA/stroke/intercerebral hemorrhage given his completely normal exam and the history he presents. TSH wnl in 2013. Orthostatics negative today in clinic. Patient's only currently medication is metoprolol. Initially, I felt the patient's symptoms sounded similar to benign paroxysmal positional vertigo vs Meniere's (however no tinnitus)  however it is difficult to  determine without a great account of symptoms.  Another consideration is that is more psychiatric in nature as many of his symptoms could be secondary to anxiety (facial numbness, lightheadedness)  -Advised pt to keep a diary of onset setting, symptoms, and duration of episodes so I can get a better idea of what is occuring - Will obtain a CBC to evaluate for anemia, however this has been neg in the past. - Pt asked to f/u with me in 1 month with diary.  - Advised is sx worsen or change to seek medical attention immediately.    Orders Placed This Encounter  Procedures  . DG Chest 2 View    Standing Status: Future     Number of Occurrences:      Standing Expiration Date: 01/14/2015    Order Specific Question:  Reason for Exam (SYMPTOM  OR DIAGNOSIS REQUIRED)    Answer:  History of pnuemonia, wheezing/cough    Order Specific Question:  Preferred imaging location?    Answer:  [facility]  . CBC    No orders of the defined types were placed in this encounter.

## 2013-11-12 NOTE — Assessment & Plan Note (Signed)
Patient most concerned because his mother has had hypertrophic cardiomyopathy. Pt had an echocardiogram in 2009 with no signs of hypertrophy and EKGs have been normal in the past- I reassured him that I was not too worried about HOCM, however will consider it in the future if there's no resolution.  I feel that this is less likely a TIA/stroke/intercerebral hemorrhage given his completely normal exam and the history he presents. TSH wnl in 2013. Orthostatics negative today in clinic. Patient's only currently medication is metoprolol. Initially, I felt the patient's symptoms sounded similar to benign paroxysmal positional vertigo vs Meniere's (however no tinnitus)  however it is difficult to determine without a great account of symptoms.  Another consideration is that is more psychiatric in nature as many of his symptoms could be secondary to anxiety (facial numbness, lightheadedness)  -Advised pt to keep a diary of onset setting, symptoms, and duration of episodes so I can get a better idea of what is occuring - Will obtain a CBC to evaluate for anemia, however this has been neg in the past. - Pt asked to f/u with me in 1 month with diary.  - Advised is sx worsen or change to seek medical attention immediately.

## 2013-12-16 ENCOUNTER — Encounter: Payer: Self-pay | Admitting: Family Medicine

## 2013-12-16 ENCOUNTER — Ambulatory Visit (INDEPENDENT_AMBULATORY_CARE_PROVIDER_SITE_OTHER): Payer: No Typology Code available for payment source | Admitting: Family Medicine

## 2013-12-16 VITALS — BP 152/95 | HR 76 | Temp 98.4°F | Wt 154.0 lb

## 2013-12-16 DIAGNOSIS — S161XXA Strain of muscle, fascia and tendon at neck level, initial encounter: Secondary | ICD-10-CM | POA: Insufficient documentation

## 2013-12-16 DIAGNOSIS — K0889 Other specified disorders of teeth and supporting structures: Secondary | ICD-10-CM | POA: Insufficient documentation

## 2013-12-16 DIAGNOSIS — S139XXA Sprain of joints and ligaments of unspecified parts of neck, initial encounter: Secondary | ICD-10-CM

## 2013-12-16 DIAGNOSIS — R42 Dizziness and giddiness: Secondary | ICD-10-CM

## 2013-12-16 DIAGNOSIS — K089 Disorder of teeth and supporting structures, unspecified: Secondary | ICD-10-CM

## 2013-12-16 MED ORDER — MECLIZINE HCL 12.5 MG PO TABS: 12.5000 mg | ORAL_TABLET | Freq: Three times a day (TID) | ORAL | Status: DC | PRN

## 2013-12-16 MED ORDER — CYCLOBENZAPRINE HCL 10 MG PO TABS: 10.0000 mg | ORAL_TABLET | Freq: Three times a day (TID) | ORAL | Status: DC | PRN

## 2013-12-16 MED ORDER — TRAMADOL HCL 50 MG PO TABS: 50.0000 mg | ORAL_TABLET | Freq: Three times a day (TID) | ORAL | Status: DC | PRN
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
## 2013-12-16 NOTE — Assessment & Plan Note (Signed)
Patient with continued symptoms however less frequent. Given his history of moderate caffeine intake this could be secondary to a combination of the beta blocker and caffeine. We also discussed the possibility of this being 2/2 to anxiety, as he notes similar symptoms when he hyperventilates for me on the lung exam. I feel this is less likely TIA/stroke/intercerebral hemorrhage or cardiac in nature. Hemoglobin normal on previous CBC.  - Discussed decreasing caffeine intake  - Can try meclizine, however this seems less like vertigo so I doubt this will help - If symptoms continue, will consider adding on a SSRI for anxiety  - RTC precautions discussed.

## 2013-12-16 NOTE — Assessment & Plan Note (Signed)
Pain in the left upper molar with eating and cold. No abscess or signs of infection noted. No fevers/chills. - Referral to dentist - Will hold abx for now, however if sx worsen prior to seeing a dentist he may need to come back in to be re-evaluated for possible clinda.
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
## 2013-12-16 NOTE — Patient Instructions (Signed)
It was nice seeing you again! Try to cut out all caffeine from your diet (soft drinks, coffee, tea, and chocolate) as this in combination with your blood pressure medication can cause similar symptoms. I have sent a prescription for meclizine to your pharmacy which may help with your lightheadedness.  I have provided you with Flexeril which should help with the tightness in your neck and upper back Tramadol should help with your headache.  Please follow up with me in 1 month or sooner if symptoms begin to worsen.  Cervical Sprain A cervical sprain is when the tissues (ligaments) that hold the neck bones in place stretch or tear. HOME CARE   Put ice on the injured area.  Put ice in a plastic bag.  Place a towel between your skin and the bag.  Leave the ice on for 15-20 minutes, 3-4 times a day.  You may have been given a collar to wear. This collar keeps your neck from moving while you heal.  Do not take the collar off unless told by your doctor.  If you have long hair, keep it outside of the collar.  Ask your doctor before changing the position of your collar. You may need to change its position over time to make it more comfortable.  If you are allowed to take off the collar for cleaning or bathing, follow your doctor's instructions on how to do it safely.  Keep your collar clean by wiping it with mild soap and water. Dry it completely. If the collar has removable pads, remove them every 1-2 days to hand wash them with soap and water. Allow them to air dry. They should be dry before you wear them in the collar.  Do not drive while wearing the collar.  Only take medicine as told by your doctor.  Keep all doctor visits as told.  Keep all physical therapy visits as told.  Adjust your work station so that you have good posture while you work.  Avoid positions and activities that make your problems worse.  Warm up and stretch before being active. GET HELP IF:  Your pain is  not controlled with medicine.  You cannot take less pain medicine over time as planned.  Your activity level does not improve as expected. GET HELP RIGHT AWAY IF:   You are bleeding.  Your stomach is upset.  You have an allergic reaction to your medicine.  You develop new problems that you cannot explain.  You lose feeling (become numb) or you cannot move any part of your body (paralysis).  You have tingling or weakness in any part of your body.  Your symptoms get worse. Symptoms include:  Pain, soreness, stiffness, puffiness (swelling), or a burning feeling in your neck.  Pain when your neck is touched.  Shoulder or upper back pain.  Limited ability to move your neck.  Headache.  Dizziness.  Your hands or arms feel week, lose feeling, or tingle.  Muscle spasms.  Difficulty swallowing or chewing. MAKE SURE YOU:   Understand these instructions.  Will watch your condition.  Will get help right away if you are not doing well or get worse. Document Released: 10/10/2007 Document Revised: 12/24/2012 Document Reviewed: 10/29/2012 [facility] Patient Information 2015 [place], [place]. This information is not intended to replace advice given to you by your health care provider. Make sure you discuss any questions you have with your health care provider.

## 2014-01-18 ENCOUNTER — Encounter: Payer: Self-pay | Admitting: Family Medicine

## 2014-01-18 ENCOUNTER — Ambulatory Visit (INDEPENDENT_AMBULATORY_CARE_PROVIDER_SITE_OTHER): Payer: Self-pay | Admitting: Family Medicine

## 2014-01-18 VITALS — BP 130/85 | HR 80 | Temp 98.5°F | Ht 66.0 in | Wt 158.2 lb

## 2014-01-18 DIAGNOSIS — K089 Disorder of teeth and supporting structures, unspecified: Secondary | ICD-10-CM

## 2014-01-18 DIAGNOSIS — F411 Generalized anxiety disorder: Secondary | ICD-10-CM

## 2014-01-18 DIAGNOSIS — R42 Dizziness and giddiness: Secondary | ICD-10-CM

## 2014-01-18 DIAGNOSIS — B351 Tinea unguium: Secondary | ICD-10-CM

## 2014-01-18 DIAGNOSIS — K0889 Other specified disorders of teeth and supporting structures: Secondary | ICD-10-CM

## 2014-01-18 HISTORY — DX: Tinea unguium: B35.1

## 2014-01-18 LAB — COMPREHENSIVE METABOLIC PANEL
ALBUMIN: 4.5 g/dL (ref 3.5–5.2)
ALT: 26 U/L (ref 0–53)
AST: 21 U/L (ref 0–37)
Alkaline Phosphatase: 71 U/L (ref 39–117)
BUN: 20 mg/dL (ref 6–23)
CALCIUM: 9.8 mg/dL (ref 8.4–10.5)
CHLORIDE: 102 meq/L (ref 96–112)
CO2: 28 mEq/L (ref 19–32)
Creat: 1.04 mg/dL (ref 0.50–1.35)
Glucose, Bld: 95 mg/dL (ref 70–99)
POTASSIUM: 4.7 meq/L (ref 3.5–5.3)
SODIUM: 137 meq/L (ref 135–145)
TOTAL PROTEIN: 6.9 g/dL (ref 6.0–8.3)
Total Bilirubin: 0.3 mg/dL (ref 0.2–1.2)

## 2014-01-18 MED ORDER — TERBINAFINE HCL 250 MG PO TABS: 250.0000 mg | ORAL_TABLET | Freq: Every day | ORAL | Status: DC

## 2014-01-18 NOTE — Assessment & Plan Note (Signed)
Noted on the right great toenail initially, now spreading to the 5th toe. Understandably, this has been refractory to OTC creams. No signs of infection on exam.  - Baseline LFTs today  - Lamisil 250mg  daily x 6weeks: discussed risks/benefits  - Will repeat LFTs in 6 weeks and if stable, continue Lamisil for an additional 6 weeks. - Discussed that this may only result in partial or no improvement and it can take quite some time to see results.  - If not improvement and very bothersome, may eventually considered toenail removal but I do not feel we're at that point just yet.

## 2014-01-18 NOTE — Assessment & Plan Note (Signed)
Discussed the importance of not using medication not prescribed to him and concerns for withdraw and long-term dependence. Patient states in the future he may be more interested in considering alternative therapies but not currently interested. - Consider trying Buspar in the future  - If benzos are what works best, may consider Klonopin in the future

## 2014-01-18 NOTE — Progress Notes (Signed)
[facility]  Archie Patten, MD  Subjective:  Chief complaint: f/u light headedness, anxiety, left molar pain, right toenail fungus   Lightheadedness  The lightheadedness, numbness/tingling of the face and "tunnel vision" have improved. Patient feels it could be a mixture of eating more regularly and avoid caffeine (has completely cut out mountain dew) as suggested previous. Suggested if it begins again we may need to discuss starting an SSRI as pt has a strong h/o anxiety (although he did not feel his previous episodes of lightheadedness were similar to his anxiety attacks.  Anxiety: States it is constantly present: sometimes it is not so bad and sometimes he feels light headed. He feels the metoprolol does help some. Notes he used to be prescribed Xanax but that was discontinued. Has been on Zoloft in the past and felt it did not help. Endorses uses Xanax 0.5mg  qHS that is not prescribed to him. When asked who provides it he states "I dont have to tell you everything."  States he cannot sleep if he doesn't have Xanax. Not interested in discussing treatment options further currently. Denies alcohol or other illicit drug use.   Right toenail: Notes the great toenail started turning colors several years ago and has gotten thicker. Due to the thickness of the toenail, he has started to have some occasional pain. Notes the 5th toe is now changing like the great toe did. Denies any trauma to the area. Has used OTC anti-fungal creams with no improvement.   Left upper molar pain: Stable, worried as he has not heard back from the dentist. Pain with drinking cold beverages and chewing hard food. No pain with warm beverages. No fever/chills.    ROS- No SOB, chest pain, or fatigue  Past Medical History Patient Active Problem List   Diagnosis Date Noted  . Onychomycosis 01/18/2014  . Pain, dental 12/16/2013  . Cervical strain, acute 12/16/2013  . Pneumonia 11/12/2013  . URI (upper  respiratory infection) 08/13/2011  . Cough 08/09/2011  . IBS (irritable bowel syndrome)- Constipation Predominant 06/19/2011  . Headache 05/14/2011  . Light-headedness 05/14/2011  . Left ear pain 05/14/2011  . Inguinal hernia, bilateral 03/02/2011  . Bilateral inguinal hernia 02/26/2011  . Dental abscess 02/15/2011  . Shortness of breath 01/30/2011  . Fatigue 12/13/2010  . Abdominal pain 10/20/2010  . Shoulder pain, bilateral 07/31/2010  . Orthopnea 07/31/2010  . BACK PAIN, CHRONIC 06/09/2010  . UNSPECIFIED VISUAL DISTURBANCE 12/12/2009  . UNSPECIFIED CONDITION OF BRAIN 10/19/2009  . ALLERGIC RHINITIS, SEASONAL 08/25/2009  . HYPOKALEMIA, HX OF 12/30/2008  . Essential hypertension, benign 08/04/2007  . LOW BACK PAIN, CHRONIC 08/04/2007  . Mood disorder NOS 05/28/2007  . PALPITATIONS 04/25/2007  . CHEST PAIN 04/18/2007  . PROSTATITIS NOS 08/14/2006  . ANXIETY 07/04/2006  . PANIC ATTACKS 07/04/2006  . GASTROESOPHAGEAL REFLUX, NO ESOPHAGITIS 07/04/2006    Social: No smoking or drugs. Is married and has 7 children.  Medications- reviewed and updated Current Outpatient Prescriptions  Medication Sig Dispense Refill  . cyclobenzaprine (FLEXERIL) 10 MG tablet Take 1 tablet (10 mg total) by mouth 3 (three) times daily as needed for muscle spasms.  15 tablet  0  . meclizine (ANTIVERT) 12.5 MG tablet Take 1 tablet (12.5 mg total) by mouth 3 (three) times daily as needed (light headedness).  30 tablet  0  . metoprolol succinate (TOPROL-XL) 50 MG 24 hr tablet Take 1 tablet (50 mg total) by mouth daily. Take with or immediately following a meal.  30 tablet  1  . terbinafine (LAMISIL) 250 MG tablet Take 1 tablet (250 mg total) by mouth daily.  42 tablet  0   No current facility-administered medications for this visit.    Objective: BP 130/85  Pulse 80  Temp(Src) 98.5 F (36.9 C) (Oral)  Ht 5\' 6"  (1.676 m)  Wt 158 lb 3.2 oz (71.759 kg)  BMI 25.55 kg/m2 Gen: No acute distress. Alert,  cooperative with exam HEENT: Atraumatic, EOMI, PERRLA, Oropharynx clear. MMM. Left upper molar is slightly loose, no erythema or abscess noted. No signs of infection.  CV: RRR. No murmurs, rubs, or gallops noted. 2+ radial and DP pulses bilaterally. Resp: CTAB. No wheezing (improved from previous exams), crackles, or rhonchi noted. Abd: +BS. Soft, non-distended, non-tender. No rebound or guarding.  Ext: Right foot: yellow, brittle, hypertrophic great toenail and 5th toenail. No signs if erythema or drainage. Slightly tender to palpation over the great toenail. Both toenails still attached to nail bed. Neuro: Alert and oriented, No gross focal deficits   Assessment/Plan:  ANXIETY Discussed the importance of not using medication not prescribed to him and concerns for withdraw and long-term dependence. Patient states in the future he may be more interested in considering alternative therapies but not currently interested. - Consider trying Buspar in the future  - If benzos are what works best, may consider Klonopin in the future   Light-headedness Improved. If symptoms return consider evaluating PO intake and/or anxiety   Pain, dental Stable in symptoms and on exam. No signs of infection.  Advised that it may be 6 months before there is an opening at the dentist as this is not an emergency  Onychomycosis Noted on the right great toenail initially, now spreading to the 5th toe. Understandably, this has been refractory to OTC creams. No signs of infection on exam.  - Baseline LFTs today  - Lamisil 250mg  daily x 6weeks: discussed risks/benefits  - Will repeat LFTs in 6 weeks and if stable, continue Lamisil for an additional 6 weeks. - Discussed that this may only result in partial or no improvement and it can take quite some time to see results.  - If not improvement and very bothersome, may eventually considered toenail removal but I do not feel we're at that point just yet.    Orders  Placed This Encounter  Procedures  . Comprehensive metabolic panel    Meds ordered this encounter  Medications  . terbinafine (LAMISIL) 250 MG tablet    Sig: Take 1 tablet (250 mg total) by mouth daily.    Dispense:  42 tablet    Refill:  0

## 2014-01-18 NOTE — Assessment & Plan Note (Signed)
Improved. If symptoms return consider evaluating PO intake and/or anxiety

## 2014-01-18 NOTE — Progress Notes (Signed)
Patient ID: Adam [name], male   DOB: 09/08/1965, 48 y.o.   MRN: [id number] Discussed with Dr. Lorenso Courier. Agree with her documentation and management.

## 2014-01-18 NOTE — Patient Instructions (Addendum)
It was nice to see you again. I will start you on Lamisil for the toenail fungus. You should come back after 6 weeks to have repeat labs drawn, then I will prescribe another 6 weeks of medication Terbinafine oral granules What is this medicine? TERBINAFINE (TER bin a feen) is an antifungal medicine. It is used to treat certain kinds of fungal or yeast infections. This medicine may be used for other purposes; ask your health care provider or pharmacist if you have questions. COMMON BRAND NAME(S): Lamisil What should I tell my health care provider before I take this medicine? They need to know if you have any of these conditions: -drink alcoholic beverages -kidney disease -liver disease -an unusual or allergic reaction to Terbinafine, other medicines, foods, dyes, or preservatives -pregnant or trying to get pregnant -breast-feeding How should I use this medicine? Take this medicine by mouth. Follow the directions on the prescription label. Hold packet with cut line on top. Shake packet gently to settle contents. Tear packet open along cut line, or use scissors to cut across line. Carefully pour the entire contents of packet onto a spoonful of a soft food, such as pudding or other soft, non-acidic food such as mashed potatoes (do NOT use applesauce or a fruit-based food). If two packets are required for each dose, you may either sprinkle the content of both packets on one spoonful of non-acidic food, or sprinkle the contents of both packets on two spoonfuls of non-acidic food. Make sure that no granules remain in the packet. Swallow the mxiture of the food and granules without chewing. Take your medicine at regular intervals. Do not take it more often than directed. Take all of your medicine as directed even if you think you are better. Do not skip doses or stop your medicine early. Contact your pediatrician or health care professional regarding the use of this medicine in children. While this  medicine may be prescribed for children as young as 4 years for selected conditions, precautions do apply. Overdosage: If you think you have taken too much of this medicine contact a poison control center or emergency room at once. NOTE: This medicine is only for you. Do not share this medicine with others. What if I miss a dose? If you miss a dose, take it as soon as you can. If it is almost time for your next dose, take only that dose. Do not take double or extra doses. What may interact with this medicine? Do not take this medicine with any of the following medications: -thioridazine This medicine may also interact with the following medications: -beta-blockers -caffeine -cimetidine -cyclosporine -MAOIs like Carbex, Eldepryl, Marplan, Nardil, and Parnate -medicines for fungal infections like fluconazole and ketoconazole -medicines for irregular heartbeat like amiodarone, flecainide and propafenone -rifampin -SSRIs like citalopram, escitalopram, fluoxetine, fluvoxamine, paroxetine and sertraline -tricyclic antidepressants like amitriptyline, clomipramine, desipramine, imipramine, nortriptyline, and others -warfarin This list may not describe all possible interactions. Give your health care provider a list of all the medicines, herbs, non-prescription drugs, or dietary supplements you use. Also tell them if you smoke, drink alcohol, or use illegal drugs. Some items may interact with your medicine. What should I watch for while using this medicine? Your doctor may monitor your liver function. Tell your doctor right away if you have nausea or vomiting, loss of appetite, stomach pain on your right upper side, yellow skin, dark urine, light stools, or are over tired. You need to take this medicine for 6 weeks or longer  to cure the fungal infection. Take your medicine regularly for as long as your doctor or health care professional tells you to. What side effects may I notice from receiving this  medicine? Side effects that you should report to your doctor or health care professional as soon as possible: -allergic reactions like skin rash or hives, swelling of the face, lips, or tongue -change in vision -dark urine -fever or infection -general ill feeling or flu-like symptoms -light-colored stools -loss of appetite, nausea -redness, blistering, peeling or loosening of the skin, including inside the mouth -right upper belly pain -unusually weak or tired -yellowing of the eyes or skin Side effects that usually do not require medical attention (report to your doctor or health care professional if they continue or are bothersome): -changes in taste -diarrhea -hair loss -muscle or joint pain -stomach upset This list may not describe all possible side effects. Call your doctor for medical advice about side effects. You may report side effects to FDA at 1-800-FDA-1088. Where should I keep my medicine? Keep out of the reach of children. Store at room temperature between 15 and 30 degrees C (59 and 86 degrees F). Throw away any unused medicine after the expiration date. NOTE: This sheet is a summary. It may not cover all possible information. If you have questions about this medicine, talk to your doctor, pharmacist, or health care provider.  2015, [employer]. (2007-07-04 17:25:48)

## 2014-01-18 NOTE — Assessment & Plan Note (Signed)
Stable in symptoms and on exam. No signs of infection.  Advised that it may be 6 months before there is an opening at the dentist as this is not an emergency

## 2014-03-08 ENCOUNTER — Other Ambulatory Visit: Payer: Self-pay

## 2014-03-16 ENCOUNTER — Ambulatory Visit (INDEPENDENT_AMBULATORY_CARE_PROVIDER_SITE_OTHER): Payer: No Typology Code available for payment source | Admitting: Family Medicine

## 2014-03-16 ENCOUNTER — Encounter: Payer: Self-pay | Admitting: Family Medicine

## 2014-03-16 VITALS — BP 142/89 | HR 80 | Temp 98.4°F | Wt 159.0 lb

## 2014-03-16 DIAGNOSIS — D171 Benign lipomatous neoplasm of skin and subcutaneous tissue of trunk: Secondary | ICD-10-CM

## 2014-03-16 NOTE — Patient Instructions (Signed)
It was great seeing you today.   1. Stomach mass: this is most likely another lipoma (fatty tissue growth) similar to what you've had in the past or surgical adhesions (growth of scar tissue). Keep an eye on this and if it continues to get better, or the pain worsens, come back to clinic.     If we did any lab work today, if it is normal you can expect a letter in the mail.  Please bring all your medications to every doctors visit  Sign up for My Chart to have easy access to your labs results, and communication with your Primary care physician.    Please check-out at the front desk before leaving the clinic.   I look forward to talking with you again at our next visit. If you have any questions or concerns before then, please call the clinic at [phone].  Take Care,   Dr. Tawanna Sat
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
## 2014-03-17 DIAGNOSIS — D171 Benign lipomatous neoplasm of skin and subcutaneous tissue of trunk: Secondary | ICD-10-CM | POA: Insufficient documentation

## 2014-03-17 NOTE — Assessment & Plan Note (Signed)
History, exam, bedside ultrasound appear most consistent with lipoma of abdominal wall. Causing him some pain but primarily worried about the increase he size he has appreciated.  Plan: continue to monitor, pt to return if size continues to increase or pain becomes an issue. May consider referral to surgery at that time for elective removal/biopsy.

## 2014-03-19 ENCOUNTER — Emergency Department (HOSPITAL_COMMUNITY)
Admission: EM | Admit: 2014-03-19 | Discharge: 2014-03-19 | Disposition: A | Discharge status: Home / Self Care | Payer: No Typology Code available for payment source | Source: Home / Self Care | Attending: Family Medicine | Admitting: Family Medicine

## 2014-03-19 ENCOUNTER — Encounter (HOSPITAL_COMMUNITY): Payer: Self-pay | Admitting: Emergency Medicine

## 2014-03-19 DIAGNOSIS — M5431 Sciatica, right side: Secondary | ICD-10-CM

## 2014-03-19 DIAGNOSIS — M62830 Muscle spasm of back: Secondary | ICD-10-CM

## 2014-03-19 MED ORDER — PREDNISONE 20 MG PO TABS: 60.0000 mg | ORAL_TABLET | Freq: Every day | ORAL | Status: DC

## 2014-03-19 MED ORDER — METHOCARBAMOL 500 MG PO TABS: 500.0000 mg | ORAL_TABLET | Freq: Four times a day (QID) | ORAL | Status: DC | PRN

## 2014-03-19 MED ORDER — KETOROLAC TROMETHAMINE 60 MG/2ML IM SOLN
60.0000 mg | Freq: Once | INTRAMUSCULAR | Status: AC
  Administered 2014-03-19: 60 mg via INTRAMUSCULAR

## 2014-03-19 MED ORDER — KETOROLAC TROMETHAMINE 60 MG/2ML IM SOLN
INTRAMUSCULAR | Status: AC
  Filled 2014-03-19: qty 2

## 2014-03-19 NOTE — ED Provider Notes (Signed)
CSN: [id number]     Arrival date & time 03/19/14  1439 History   First MD Initiated Contact with Patient 03/19/14 1515     Chief Complaint  Patient presents with  . Back Pain    lower right   (Consider location/radiation/quality/duration/timing/severity/associated sxs/prior Treatment) HPI  Back pain: helping to move a washer and felt his back popped. R lower back. Felt like a rubber band popped. Made pt nauseous. Occurred 2 hrs ago. Shooting pain down R buttock and posterior leg. H/o chronic back problems. H/o DDD. Took 2 ASA when the injury occurred. Denies any loss of strength, bladder or bowel function, falls.    Past Medical History  Diagnosis Date  . Palpitations   . Family history of stress   . Prostatitis, unspecified   . Anxiety state, unspecified   . Unspecified cardiovascular disease   . IBS (irritable bowel syndrome)   . Visual disturbance   . Esophageal reflux     no meds. per pt.  . Generalized headaches   . Major depressive disorder, single episode, unspecified   . Panic disorder without agoraphobia   . Hypertension   . RBBB (right bundle branch block)   . Hiatal hernia     pt. denies nerve/mucscle disease  . Sleep apnea    Past Surgical History  Procedure Laterality Date  . Pyloric stenosis     . 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 Right arm      fracture--Right arm fracture s/p ORIF from roller blading.  . Esophagogastroduodenoscopy  01/03/2005    normal  . Fracture surgery  2000    left arm  . Inguinal hernia repair  03/14/2011    Procedure: LAPAROSCOPIC BILATERAL INGUINAL HERNIA REPAIR;  Surgeon: Harl Bowie, MD;  Location: [facility];  Service: General;  Laterality: N/A;  with mesh  . Hernia repair  03/14/2011    BIH  . Colonoscopy  07/26/2005    normal   Family History  Problem Relation Age of Onset  . Heart attack Mother 13    PTCA  and 3 stents  . Heart disease Mother   . Hodgkin's lymphoma  Maternal Grandmother   . Cancer Maternal Aunt     pt unaware of what kind  . Cancer Maternal Uncle     pt unaware of what kind  . Pyloric stenosis Daughter 50    X2  . Anxiety disorder    . Bipolar disorder Daughter    History  Substance Use Topics  . Smoking status: Never Smoker   . Smokeless tobacco: Never Used  . Alcohol Use: No    Review of Systems Per HPI with all other pertinent systems negative.   Allergies  Hctz and Penicillins  Home Medications   Prior to Admission medications   Medication Sig Start Date End Date Taking? Authorizing Provider  metoprolol succinate (TOPROL-XL) 50 MG 24 hr tablet Take 1 tablet (50 mg total) by mouth daily. Take with or immediately following a meal. 10/29/13  Yes Kandis Nab, MD  cyclobenzaprine (FLEXERIL) 10 MG tablet Take 1 tablet (10 mg total) by mouth 3 (three) times daily as needed for muscle spasms. 12/16/13   Archie Patten, MD  meclizine (ANTIVERT) 12.5 MG tablet Take 1 tablet (12.5 mg total) by mouth 3 (three) times daily as needed (light headedness). 12/16/13   Archie Patten, MD  methocarbamol (ROBAXIN) 500 MG tablet Take 1-2 tablets (500-1,000  mg total) by mouth every 6 (six) hours as needed for muscle spasms. 03/19/14   Waldemar Dickens, MD  predniSONE (DELTASONE) 20 MG tablet Take 3 tablets (60 mg total) by mouth daily. 03/19/14   Waldemar Dickens, MD  terbinafine (LAMISIL) 250 MG tablet Take 1 tablet (250 mg total) by mouth daily. 01/18/14   Archie Patten, MD   BP 137/91 mmHg  Pulse 71  Temp(Src) 99.1 F (37.3 C) (Oral)  Resp 16  SpO2 97% Physical Exam  Constitutional: He is oriented to person, place, and time. He appears well-developed and well-nourished. No distress.  HENT:  Head: Normocephalic and atraumatic.  Eyes: EOM are normal. Pupils are equal, round, and reactive to light.  Neck: Normal range of motion.  Cardiovascular: Normal rate, normal heart sounds and intact distal pulses.   No murmur  heard. Pulmonary/Chest: Effort normal and breath sounds normal.  Abdominal: Soft. Bowel sounds are normal.  Musculoskeletal:  Sitting to the L side.  FROM back. Ambulates w/o difficulty Straight leg raise nml FABERs neg R lower thoracic perispinal muscle tightness and ttp Hip flexion bilat 5/5.   Neurological: He is alert and oriented to person, place, and time.  Skin: Skin is warm and dry. No rash noted. He is not diaphoretic.  Psychiatric: His behavior is normal. Judgment and thought content normal.    ED Course  Procedures (including critical care time) Labs Review Labs Reviewed - No data to display  Imaging Review No results found.   MDM   1. Back spasm   2. Sciatica, right    toradol 60mg  IM in office  Start prednisone 60mg  x 7 days Ice, exercises, massage.  Rx given for Robaxin  Pt to go to ED if condition does not improve or worsens.   Linna Darner, MD Family Medicine 03/19/2014, 3:38 PM    Waldemar Dickens, MD 03/19/14 [phone]

## 2014-03-19 NOTE — ED Notes (Signed)
Pt said he was helping move a washing machine when he felt something "like a rubberband" pop in his right lower back.  He felt it in his abdomen and now has shooting pains down his right leg.  He is also nauseous from the pain.

## 2014-03-19 NOTE — Discharge Instructions (Signed)
You are likely suffering from sciatica and muscle spasm You were given toradol in our office. Please  Do not take any ibuprofen or advil for 24 hours Please start the steroids Please use the muscle relaxer for the spasms in your right mid back Please do the exercises below Please apply ice and gentle massage  Sciatica with Rehab The sciatic nerve runs from the back down the leg and is responsible for sensation and control of the muscles in the back (posterior) side of the thigh, lower leg, and foot. Sciatica is a condition that is characterized by inflammation of this nerve.  SYMPTOMS   Signs of nerve damage, including numbness and/or weakness along the posterior side of the lower extremity.  Pain in the back of the thigh that may also travel down the leg.  Pain that worsens when sitting for long periods of time.  Occasionally, pain in the back or buttock. CAUSES  Inflammation of the sciatic nerve is the cause of sciatica. The inflammation is due to something irritating the nerve. Common sources of irritation include:  Sitting for long periods of time.  Direct trauma to the nerve.  Arthritis of the spine.  Herniated or ruptured disk.  Slipping of the vertebrae (spondylolisthesis).  Pressure from soft tissues, such as muscles or ligament-like tissue (fascia). RISK INCREASES WITH:  Sports that place pressure or stress on the spine (football or weightlifting).  Poor strength and flexibility.  Failure to warm up properly before activity.  Family history of low back pain or disk disorders.  Previous back injury or surgery.  Poor body mechanics, especially when lifting, or poor posture. PREVENTION   Warm up and stretch properly before activity.  Maintain physical fitness:  Strength, flexibility, and endurance.  Cardiovascular fitness.  Learn and use proper technique, especially with posture and lifting. When possible, have coach correct improper technique.  Avoid  activities that place stress on the spine. PROGNOSIS If treated properly, then sciatica usually resolves within 6 weeks. However, occasionally surgery is necessary.  RELATED COMPLICATIONS   Permanent nerve damage, including pain, numbness, tingle, or weakness.  Chronic back pain.  Risks of surgery: infection, bleeding, nerve damage, or damage to surrounding tissues. TREATMENT Treatment initially involves resting from any activities that aggravate your symptoms. The use of ice and medication may help reduce pain and inflammation. The use of strengthening and stretching exercises may help reduce pain with activity. These exercises may be performed at home or with referral to a therapist. A therapist may recommend further treatments, such as transcutaneous electronic nerve stimulation (TENS) or ultrasound. Your caregiver may recommend corticosteroid injections to help reduce inflammation of the sciatic nerve. If symptoms persist despite non-surgical (conservative) treatment, then surgery may be recommended. MEDICATION  If pain medication is necessary, then nonsteroidal anti-inflammatory medications, such as aspirin and ibuprofen, or other minor pain relievers, such as acetaminophen, are often recommended.  Do not take pain medication for 7 days before surgery.  Prescription pain relievers may be given if deemed necessary by your caregiver. Use only as directed and only as much as you need.  Ointments applied to the skin may be helpful.  Corticosteroid injections may be given by your caregiver. These injections should be reserved for the most serious cases, because they may only be given a certain number of times. HEAT AND COLD  Cold treatment (icing) relieves pain and reduces inflammation. Cold treatment should be applied for 10 to 15 minutes every 2 to 3 hours for inflammation and pain  and immediately after any activity that aggravates your symptoms. Use ice packs or massage the area with a  piece of ice (ice massage).  Heat treatment may be used prior to performing the stretching and strengthening activities prescribed by your caregiver, physical therapist, or athletic trainer. Use a heat pack or soak the injury in warm water. SEEK MEDICAL CARE IF:  Treatment seems to offer no benefit, or the condition worsens.  Any medications produce adverse side effects. EXERCISES  RANGE OF MOTION (ROM) AND STRETCHING EXERCISES - Sciatica Most people with sciatic will find that their symptoms worsen with either excessive bending forward (flexion) or arching at the low back (extension). The exercises which will help resolve your symptoms will focus on the opposite motion. Your physician, physical therapist or athletic trainer will help you determine which exercises will be most helpful to resolve your low back pain. Do not complete any exercises without first consulting with your clinician. Discontinue any exercises which worsen your symptoms until you speak to your clinician. If you have pain, numbness or tingling which travels down into your buttocks, leg or foot, the goal of the therapy is for these symptoms to move closer to your back and eventually resolve. Occasionally, these leg symptoms will get better, but your low back pain may worsen; this is typically an indication of progress in your rehabilitation. Be certain to be very alert to any changes in your symptoms and the activities in which you participated in the 24 hours prior to the change. Sharing this information with your clinician will allow him/her to most efficiently treat your condition. These exercises may help you when beginning to rehabilitate your injury. Your symptoms may resolve with or without further involvement from your physician, physical therapist or athletic trainer. While completing these exercises, remember:   Restoring tissue flexibility helps normal motion to return to the joints. This allows healthier, less painful  movement and activity.  An effective stretch should be held for at least 30 seconds.  A stretch should never be painful. You should only feel a gentle lengthening or release in the stretched tissue. FLEXION RANGE OF MOTION AND STRETCHING EXERCISES: STRETCH - Flexion, Single Knee to Chest   Lie on a firm bed or floor with both legs extended in front of you.  Keeping one leg in contact with the floor, bring your opposite knee to your chest. Hold your leg in place by either grabbing behind your thigh or at your knee.  Pull until you feel a gentle stretch in your low back. Hold __________ seconds.  Slowly release your grasp and repeat the exercise with the opposite side. Repeat __________ times. Complete this exercise __________ times per day.  STRETCH - Flexion, Double Knee to Chest  Lie on a firm bed or floor with both legs extended in front of you.  Keeping one leg in contact with the floor, bring your opposite knee to your chest.  Tense your stomach muscles to support your back and then lift your other knee to your chest. Hold your legs in place by either grabbing behind your thighs or at your knees.  Pull both knees toward your chest until you feel a gentle stretch in your low back. Hold __________ seconds.  Tense your stomach muscles and slowly return one leg at a time to the floor. Repeat __________ times. Complete this exercise __________ times per day.  STRETCH - Low Trunk Rotation   Lie on a firm bed or floor. Keeping your legs in  front of you, bend your knees so they are both pointed toward the ceiling and your feet are flat on the floor.  Extend your arms out to the side. This will stabilize your upper body by keeping your shoulders in contact with the floor.  Gently and slowly drop both knees together to one side until you feel a gentle stretch in your low back. Hold for __________ seconds.  Tense your stomach muscles to support your low back as you bring your knees back  to the starting position. Repeat the exercise to the other side. Repeat __________ times. Complete this exercise __________ times per day  EXTENSION RANGE OF MOTION AND FLEXIBILITY EXERCISES: STRETCH - Extension, Prone on Elbows  Lie on your stomach on the floor, a bed will be too soft. Place your palms about shoulder width apart and at the height of your head.  Place your elbows under your shoulders. If this is too painful, stack pillows under your chest.  Allow your body to relax so that your hips drop lower and make contact more completely with the floor.  Hold this position for __________ seconds.  Slowly return to lying flat on the floor. Repeat __________ times. Complete this exercise __________ times per day.  RANGE OF MOTION - Extension, Prone Press Ups  Lie on your stomach on the floor, a bed will be too soft. Place your palms about shoulder width apart and at the height of your head.  Keeping your back as relaxed as possible, slowly straighten your elbows while keeping your hips on the floor. You may adjust the placement of your hands to maximize your comfort. As you gain motion, your hands will come more underneath your shoulders.  Hold this position __________ seconds.  Slowly return to lying flat on the floor. Repeat __________ times. Complete this exercise __________ times per day.  STRENGTHENING EXERCISES - Sciatica  These exercises may help you when beginning to rehabilitate your injury. These exercises should be done near your "sweet spot." This is the neutral, low-back arch, somewhere between fully rounded and fully arched, that is your least painful position. When performed in this safe range of motion, these exercises can be used for people who have either a flexion or extension based injury. These exercises may resolve your symptoms with or without further involvement from your physician, physical therapist or athletic trainer. While completing these exercises,  remember:   Muscles can gain both the endurance and the strength needed for everyday activities through controlled exercises.  Complete these exercises as instructed by your physician, physical therapist or athletic trainer. Progress with the resistance and repetition exercises only as your caregiver advises.  You may experience muscle soreness or fatigue, but the pain or discomfort you are trying to eliminate should never worsen during these exercises. If this pain does worsen, stop and make certain you are following the directions exactly. If the pain is still present after adjustments, discontinue the exercise until you can discuss the trouble with your clinician. STRENGTHENING - Deep Abdominals, Pelvic Tilt   Lie on a firm bed or floor. Keeping your legs in front of you, bend your knees so they are both pointed toward the ceiling and your feet are flat on the floor.  Tense your lower abdominal muscles to press your low back into the floor. This motion will rotate your pelvis so that your tail bone is scooping upwards rather than pointing at your feet or into the floor.  With a gentle tension and  even breathing, hold this position for __________ seconds. Repeat __________ times. Complete this exercise __________ times per day.  STRENGTHENING - Abdominals, Crunches   Lie on a firm bed or floor. Keeping your legs in front of you, bend your knees so they are both pointed toward the ceiling and your feet are flat on the floor. Cross your arms over your chest.  Slightly tip your chin down without bending your neck.  Tense your abdominals and slowly lift your trunk high enough to just clear your shoulder blades. Lifting higher can put excessive stress on the low back and does not further strengthen your abdominal muscles.  Control your return to the starting position. Repeat __________ times. Complete this exercise __________ times per day.  STRENGTHENING - Quadruped, Opposite UE/LE  Lift  Assume a hands and knees position on a firm surface. Keep your hands under your shoulders and your knees under your hips. You may place padding under your knees for comfort.  Find your neutral spine and gently tense your abdominal muscles so that you can maintain this position. Your shoulders and hips should form a rectangle that is parallel with the floor and is not twisted.  Keeping your trunk steady, lift your right hand no higher than your shoulder and then your left leg no higher than your hip. Make sure you are not holding your breath. Hold this position __________ seconds.  Continuing to keep your abdominal muscles tense and your back steady, slowly return to your starting position. Repeat with the opposite arm and leg. Repeat __________ times. Complete this exercise __________ times per day.  STRENGTHENING - Abdominals and Quadriceps, Straight Leg Raise   Lie on a firm bed or floor with both legs extended in front of you.  Keeping one leg in contact with the floor, bend the other knee so that your foot can rest flat on the floor.  Find your neutral spine, and tense your abdominal muscles to maintain your spinal position throughout the exercise.  Slowly lift your straight leg off the floor about 6 inches for a count of 15, making sure to not hold your breath.  Still keeping your neutral spine, slowly lower your leg all the way to the floor. Repeat this exercise with each leg __________ times. Complete this exercise __________ times per day. POSTURE AND BODY MECHANICS CONSIDERATIONS - Sciatica Keeping correct posture when sitting, standing or completing your activities will reduce the stress put on different body tissues, allowing injured tissues a chance to heal and limiting painful experiences. The following are general guidelines for improved posture. Your physician or physical therapist will provide you with any instructions specific to your needs. While reading these  guidelines, remember:  The exercises prescribed by your provider will help you have the flexibility and strength to maintain correct postures.  The correct posture provides the optimal environment for your joints to work. All of your joints have less wear and tear when properly supported by a spine with good posture. This means you will experience a healthier, less painful body.  Correct posture must be practiced with all of your activities, especially prolonged sitting and standing. Correct posture is as important when doing repetitive low-stress activities (typing) as it is when doing a single heavy-load activity (lifting). RESTING POSITIONS Consider which positions are most painful for you when choosing a resting position. If you have pain with flexion-based activities (sitting, bending, stooping, squatting), choose a position that allows you to rest in a less flexed posture. You  would want to avoid curling into a fetal position on your side. If your pain worsens with extension-based activities (prolonged standing, working overhead), avoid resting in an extended position such as sleeping on your stomach. Most people will find more comfort when they rest with their spine in a more neutral position, neither too rounded nor too arched. Lying on a non-sagging bed on your side with a pillow between your knees, or on your back with a pillow under your knees will often provide some relief. Keep in mind, being in any one position for a prolonged period of time, no matter how correct your posture, can still lead to stiffness. PROPER SITTING POSTURE In order to minimize stress and discomfort on your spine, you must sit with correct posture Sitting with good posture should be effortless for a healthy body. Returning to good posture is a gradual process. Many people can work toward this most comfortably by using various supports until they have the flexibility and strength to maintain this posture on their  own. When sitting with proper posture, your ears will fall over your shoulders and your shoulders will fall over your hips. You should use the back of the chair to support your upper back. Your low back will be in a neutral position, just slightly arched. You may place a small pillow or folded towel at the base of your low back for support.  When working at a desk, create an environment that supports good, upright posture. Without extra support, muscles fatigue and lead to excessive strain on joints and other tissues. Keep these recommendations in mind: CHAIR:   A chair should be able to slide under your desk when your back makes contact with the back of the chair. This allows you to work closely.  The chair's height should allow your eyes to be level with the upper part of your monitor and your hands to be slightly lower than your elbows. BODY POSITION  Your feet should make contact with the floor. If this is not possible, use a foot rest.  Keep your ears over your shoulders. This will reduce stress on your neck and low back. INCORRECT SITTING POSTURES   If you are feeling tired and unable to assume a healthy sitting posture, do not slouch or slump. This puts excessive strain on your back tissues, causing more damage and pain. Healthier options include:  Using more support, like a lumbar pillow.  Switching tasks to something that requires you to be upright or walking.  Talking a brief walk.  Lying down to rest in a neutral-spine position. PROLONGED STANDING WHILE SLIGHTLY LEANING FORWARD  When completing a task that requires you to lean forward while standing in one place for a long time, place either foot up on a stationary 2-4 inch high object to help maintain the best posture. When both feet are on the ground, the low back tends to lose its slight inward curve. If this curve flattens (or becomes too large), then the back and your other joints will experience too much stress, fatigue more  quickly and can cause pain.  CORRECT STANDING POSTURES Proper standing posture should be assumed with all daily activities, even if they only take a few moments, like when brushing your teeth. As in sitting, your ears should fall over your shoulders and your shoulders should fall over your hips. You should keep a slight tension in your abdominal muscles to brace your spine. Your tailbone should point down to the ground, not behind  your body, resulting in an over-extended swayback posture.  INCORRECT STANDING POSTURES  Common incorrect standing postures include a forward head, locked knees and/or an excessive swayback. WALKING Walk with an upright posture. Your ears, shoulders and hips should all line-up. PROLONGED ACTIVITY IN A FLEXED POSITION When completing a task that requires you to bend forward at your waist or lean over a low surface, try to find a way to stabilize 3 of 4 of your limbs. You can place a hand or elbow on your thigh or rest a knee on the surface you are reaching across. This will provide you more stability so that your muscles do not fatigue as quickly. By keeping your knees relaxed, or slightly bent, you will also reduce stress across your low back. CORRECT LIFTING TECHNIQUES DO :   Assume a wide stance. This will provide you more stability and the opportunity to get as close as possible to the object which you are lifting.  Tense your abdominals to brace your spine; then bend at the knees and hips. Keeping your back locked in a neutral-spine position, lift using your leg muscles. Lift with your legs, keeping your back straight.  Test the weight of unknown objects before attempting to lift them.  Try to keep your elbows locked down at your sides in order get the best strength from your shoulders when carrying an object.  Always ask for help when lifting heavy or awkward objects. INCORRECT LIFTING TECHNIQUES DO NOT:   Lock your knees when lifting, even if it is a small  object.  Bend and twist. Pivot at your feet or move your feet when needing to change directions.  Assume that you cannot safely pick up a paperclip without proper posture. Document Released: 04/23/2005 Document Revised: 09/07/2013 Document Reviewed: 08/05/2008 [facility] Patient Information 2015 [place], [place]. This information is not intended to replace advice given to you by your health care provider. Make sure you discuss any questions you have with your health care provider.

## 2014-05-16 ENCOUNTER — Emergency Department (HOSPITAL_COMMUNITY)
Admission: EM | Admit: 2014-05-16 | Discharge: 2014-05-16 | Disposition: A | Discharge status: Home / Self Care | Payer: Self-pay | Attending: Emergency Medicine | Admitting: Emergency Medicine

## 2014-05-16 ENCOUNTER — Encounter (HOSPITAL_COMMUNITY): Payer: Self-pay | Admitting: Emergency Medicine

## 2014-05-16 DIAGNOSIS — M5431 Sciatica, right side: Secondary | ICD-10-CM

## 2014-05-16 DIAGNOSIS — I10 Essential (primary) hypertension: Secondary | ICD-10-CM | POA: Insufficient documentation

## 2014-05-16 DIAGNOSIS — Z87438 Personal history of other diseases of male genital organs: Secondary | ICD-10-CM | POA: Insufficient documentation

## 2014-05-16 DIAGNOSIS — Z8781 Personal history of (healed) traumatic fracture: Secondary | ICD-10-CM | POA: Insufficient documentation

## 2014-05-16 DIAGNOSIS — Z8669 Personal history of other diseases of the nervous system and sense organs: Secondary | ICD-10-CM | POA: Insufficient documentation

## 2014-05-16 DIAGNOSIS — M5441 Lumbago with sciatica, right side: Secondary | ICD-10-CM | POA: Insufficient documentation

## 2014-05-16 DIAGNOSIS — F329 Major depressive disorder, single episode, unspecified: Secondary | ICD-10-CM | POA: Insufficient documentation

## 2014-05-16 DIAGNOSIS — Z79899 Other long term (current) drug therapy: Secondary | ICD-10-CM | POA: Insufficient documentation

## 2014-05-16 DIAGNOSIS — Z8719 Personal history of other diseases of the digestive system: Secondary | ICD-10-CM | POA: Insufficient documentation

## 2014-05-16 DIAGNOSIS — Z880 Allergy status to penicillin: Secondary | ICD-10-CM | POA: Insufficient documentation

## 2014-05-16 DIAGNOSIS — F410 Panic disorder [episodic paroxysmal anxiety] without agoraphobia: Secondary | ICD-10-CM | POA: Insufficient documentation

## 2014-05-16 LAB — BASIC METABOLIC PANEL
ANION GAP: 10 (ref 5–15)
BUN: 13 mg/dL (ref 6–23)
CALCIUM: 9.5 mg/dL (ref 8.4–10.5)
CO2: 25 mmol/L (ref 19–32)
Chloride: 106 mEq/L (ref 96–112)
Creatinine, Ser: 1.13 mg/dL (ref 0.50–1.35)
GFR calc non Af Amer: 75 mL/min — ABNORMAL LOW (ref >90)
GFR, EST AFRICAN AMERICAN: 87 mL/min — AB (ref >90)
Glucose, Bld: 110 mg/dL — ABNORMAL HIGH (ref 70–99)
Potassium: 4 mmol/L (ref 3.5–5.1)
SODIUM: 141 mmol/L (ref 135–145)

## 2014-05-16 LAB — CBC
HCT: 45 % (ref 39.0–52.0)
HEMOGLOBIN: 15.6 g/dL (ref 13.0–17.0)
MCH: 28.9 pg (ref 26.0–34.0)
MCHC: 34.7 g/dL (ref 30.0–36.0)
MCV: 83.3 fL (ref 78.0–100.0)
Platelets: 250 10*3/uL (ref 150–400)
RBC: 5.4 MIL/uL (ref 4.22–5.81)
RDW: 12.4 % (ref 11.5–15.5)
WBC: 7.1 10*3/uL (ref 4.0–10.5)

## 2014-05-16 MED ORDER — METHOCARBAMOL 500 MG PO TABS: 500.0000 mg | ORAL_TABLET | Freq: Two times a day (BID) | ORAL | Status: DC

## 2014-05-16 MED ORDER — MELOXICAM 15 MG PO TABS: 15.0000 mg | ORAL_TABLET | Freq: Every day | ORAL | Status: DC

## 2014-05-16 MED ORDER — PREDNISONE 10 MG PO TABS: ORAL_TABLET | ORAL | Status: DC

## 2014-05-16 MED ORDER — HYDROCODONE-ACETAMINOPHEN 5-325 MG PO TABS: 1.0000 | ORAL_TABLET | Freq: Four times a day (QID) | ORAL | Status: DC | PRN

## 2014-05-16 NOTE — Discharge Instructions (Signed)
1. Medications: robaxin, pelvic, prednisone, vicodin, usual home medications 2. Treatment: rest, drink plenty of fluids, gentle stretching as discussed, alternate ice and heat 3. Follow Up: Please followup with your primary doctor in 3 days for discussion of your diagnoses and further evaluation after today's visit; if you do not have a primary care doctor use the resource guide provided to find one;  Return to the ER for worsening back pain, difficulty walking, loss of bowel or bladder control or other concerning symptoms   Back Exercises Back exercises help treat and prevent back injuries. The goal of back exercises is to increase the strength of your abdominal and back muscles and the flexibility of your back. These exercises should be started when you no longer have back pain. Back exercises include:  Pelvic Tilt. Lie on your back with your knees bent. Tilt your pelvis until the lower part of your back is against the floor. Hold this position 5 to 10 sec and repeat 5 to 10 times.  Knee to Chest. Pull first 1 knee up against your chest and hold for 20 to 30 seconds, repeat this with the other knee, and then both knees. This may be done with the other leg straight or bent, whichever feels better.  Sit-Ups or Curl-Ups. Bend your knees 90 degrees. Start with tilting your pelvis, and do a partial, slow sit-up, lifting your trunk only 30 to 45 degrees off the floor. Take at least 2 to 3 seconds for each sit-up. Do not do sit-ups with your knees out straight. If partial sit-ups are difficult, simply do the above but with only tightening your abdominal muscles and holding it as directed.  Hip-Lift. Lie on your back with your knees flexed 90 degrees. Push down with your feet and shoulders as you raise your hips a couple inches off the floor; hold for 10 seconds, repeat 5 to 10 times.  Back arches. Lie on your stomach, propping yourself up on bent elbows. Slowly press on your hands, causing an arch in your  low back. Repeat 3 to 5 times. Any initial stiffness and discomfort should lessen with repetition over time.  Shoulder-Lifts. Lie face down with arms beside your body. Keep hips and torso pressed to floor as you slowly lift your head and shoulders off the floor. Do not overdo your exercises, especially in the beginning. Exercises may cause you some mild back discomfort which lasts for a few minutes; however, if the pain is more severe, or lasts for more than 15 minutes, do not continue exercises until you see your caregiver. Improvement with exercise therapy for back problems is slow.  See your caregivers for assistance with developing a proper back exercise program. Document Released: 05/31/2004 Document Revised: 07/16/2011 Document Reviewed: 02/22/2011 [facility] Patient Information 2015 [place], [place]. This information is not intended to replace advice given to you by your health care provider. Make sure you discuss any questions you have with your health care provider.   Sciatica Sciatica is pain, weakness, numbness, or tingling along the path of the sciatic nerve. The nerve starts in the lower back and runs down the back of each leg. The nerve controls the muscles in the lower leg and in the back of the knee, while also providing sensation to the back of the thigh, lower leg, and the sole of your foot. Sciatica is a symptom of another medical condition. For instance, nerve damage or certain conditions, such as a herniated disk or bone spur on the spine, pinch  or put pressure on the sciatic nerve. This causes the pain, weakness, or other sensations normally associated with sciatica. Generally, sciatica only affects one side of the body. CAUSES   Herniated or slipped disc.  Degenerative disk disease.  A pain disorder involving the narrow muscle in the buttocks (piriformis syndrome).  Pelvic injury or fracture.  Pregnancy.  Tumor (rare). SYMPTOMS  Symptoms can vary from mild to very severe.  The symptoms usually travel from the low back to the buttocks and down the back of the leg. Symptoms can include:  Mild tingling or dull aches in the lower back, leg, or hip.  Numbness in the back of the calf or sole of the foot.  Burning sensations in the lower back, leg, or hip.  Sharp pains in the lower back, leg, or hip.  Leg weakness.  Severe back pain inhibiting movement. These symptoms may get worse with coughing, sneezing, laughing, or prolonged sitting or standing. Also, being overweight may worsen symptoms. DIAGNOSIS  Your caregiver will perform a physical exam to look for common symptoms of sciatica. He or she may ask you to do certain movements or activities that would trigger sciatic nerve pain. Other tests may be performed to find the cause of the sciatica. These may include:  Blood tests.  X-rays.  Imaging tests, such as an MRI or CT scan. TREATMENT  Treatment is directed at the cause of the sciatic pain. Sometimes, treatment is not necessary and the pain and discomfort goes away on its own. If treatment is needed, your caregiver may suggest:  Over-the-counter medicines to relieve pain.  Prescription medicines, such as anti-inflammatory medicine, muscle relaxants, or narcotics.  Applying heat or ice to the painful area.  Steroid injections to lessen pain, irritation, and inflammation around the nerve.  Reducing activity during periods of pain.  Exercising and stretching to strengthen your abdomen and improve flexibility of your spine. Your caregiver may suggest losing weight if the extra weight makes the back pain worse.  Physical therapy.  Surgery to eliminate what is pressing or pinching the nerve, such as a bone spur or part of a herniated disk. HOME CARE INSTRUCTIONS   Only take over-the-counter or prescription medicines for pain or discomfort as directed by your caregiver.  Apply ice to the affected area for 20 minutes, 3-4 times a day for the first  48-72 hours. Then try heat in the same way.  Exercise, stretch, or perform your usual activities if these do not aggravate your pain.  Attend physical therapy sessions as directed by your caregiver.  Keep all follow-up appointments as directed by your caregiver.  Do not wear high heels or shoes that do not provide proper support.  Check your mattress to see if it is too soft. A firm mattress may lessen your pain and discomfort. SEEK IMMEDIATE MEDICAL CARE IF:   You lose control of your bowel or bladder (incontinence).  You have increasing weakness in the lower back, pelvis, buttocks, or legs.  You have redness or swelling of your back.  You have a burning sensation when you urinate.  You have pain that gets worse when you lie down or awakens you at night.  Your pain is worse than you have experienced in the past.  Your pain is lasting longer than 4 weeks.  You are suddenly losing weight without reason. MAKE SURE YOU:  Understand these instructions.  Will watch your condition.  Will get help right away if you are not doing well or  get worse. Document Released: 04/17/2001 Document Revised: 10/23/2011 Document Reviewed: 09/02/2011 [facility] Patient Information 2015 [place], [place]. This information is not intended to replace advice given to you by your health care provider. Make sure you discuss any questions you have with your health care provider.
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
## 2014-09-20 ENCOUNTER — Other Ambulatory Visit: Payer: Self-pay | Admitting: Family Medicine

## 2014-09-20 NOTE — Telephone Encounter (Signed)
Patient is calling because he needs a refill on metoprolol succinate (TOPROL-XL) 50 MG, please contact the patient once it is complete, thank you, Fonda Kinder, ASA

## 2014-09-22 MED ORDER — METOPROLOL SUCCINATE ER 50 MG PO TB24: 50.0000 mg | ORAL_TABLET | Freq: Every day | ORAL | Status: DC

## 2014-09-22 NOTE — Telephone Encounter (Signed)
Medication refilled, pls let pt know.  Thanks, [employer]

## 2015-02-01 ENCOUNTER — Other Ambulatory Visit: Payer: Self-pay | Admitting: Family Medicine

## 2015-03-03 ENCOUNTER — Encounter (HOSPITAL_COMMUNITY): Payer: Self-pay | Admitting: *Deleted

## 2015-03-03 ENCOUNTER — Emergency Department (HOSPITAL_COMMUNITY)
Admission: EM | Admit: 2015-03-03 | Discharge: 2015-03-03 | Disposition: A | Discharge status: Home / Self Care | Payer: Self-pay | Attending: Emergency Medicine | Admitting: Emergency Medicine

## 2015-03-03 DIAGNOSIS — S4992XA Unspecified injury of left shoulder and upper arm, initial encounter: Secondary | ICD-10-CM | POA: Insufficient documentation

## 2015-03-03 DIAGNOSIS — S199XXA Unspecified injury of neck, initial encounter: Secondary | ICD-10-CM | POA: Insufficient documentation

## 2015-03-03 DIAGNOSIS — Z8719 Personal history of other diseases of the digestive system: Secondary | ICD-10-CM | POA: Insufficient documentation

## 2015-03-03 DIAGNOSIS — Z79899 Other long term (current) drug therapy: Secondary | ICD-10-CM | POA: Insufficient documentation

## 2015-03-03 DIAGNOSIS — Z791 Long term (current) use of non-steroidal anti-inflammatories (NSAID): Secondary | ICD-10-CM | POA: Insufficient documentation

## 2015-03-03 DIAGNOSIS — F419 Anxiety disorder, unspecified: Secondary | ICD-10-CM | POA: Insufficient documentation

## 2015-03-03 DIAGNOSIS — Y92410 Unspecified street and highway as the place of occurrence of the external cause: Secondary | ICD-10-CM | POA: Insufficient documentation

## 2015-03-03 DIAGNOSIS — Z880 Allergy status to penicillin: Secondary | ICD-10-CM | POA: Insufficient documentation

## 2015-03-03 DIAGNOSIS — Z87438 Personal history of other diseases of male genital organs: Secondary | ICD-10-CM | POA: Insufficient documentation

## 2015-03-03 DIAGNOSIS — Z8739 Personal history of other diseases of the musculoskeletal system and connective tissue: Secondary | ICD-10-CM | POA: Insufficient documentation

## 2015-03-03 DIAGNOSIS — Z8669 Personal history of other diseases of the nervous system and sense organs: Secondary | ICD-10-CM | POA: Insufficient documentation

## 2015-03-03 DIAGNOSIS — Y9389 Activity, other specified: Secondary | ICD-10-CM | POA: Insufficient documentation

## 2015-03-03 DIAGNOSIS — Y999 Unspecified external cause status: Secondary | ICD-10-CM | POA: Insufficient documentation

## 2015-03-03 DIAGNOSIS — S3992XA Unspecified injury of lower back, initial encounter: Secondary | ICD-10-CM | POA: Insufficient documentation

## 2015-03-03 DIAGNOSIS — R202 Paresthesia of skin: Secondary | ICD-10-CM | POA: Insufficient documentation

## 2015-03-03 DIAGNOSIS — F410 Panic disorder [episodic paroxysmal anxiety] without agoraphobia: Secondary | ICD-10-CM | POA: Insufficient documentation

## 2015-03-03 DIAGNOSIS — Z7952 Long term (current) use of systemic steroids: Secondary | ICD-10-CM | POA: Insufficient documentation

## 2015-03-03 DIAGNOSIS — I2510 Atherosclerotic heart disease of native coronary artery without angina pectoris: Secondary | ICD-10-CM | POA: Insufficient documentation

## 2015-03-03 DIAGNOSIS — R200 Anesthesia of skin: Secondary | ICD-10-CM | POA: Insufficient documentation

## 2015-03-03 DIAGNOSIS — I10 Essential (primary) hypertension: Secondary | ICD-10-CM | POA: Insufficient documentation

## 2015-03-03 DIAGNOSIS — M549 Dorsalgia, unspecified: Secondary | ICD-10-CM

## 2015-03-03 DIAGNOSIS — S299XXA Unspecified injury of thorax, initial encounter: Secondary | ICD-10-CM | POA: Insufficient documentation

## 2015-03-03 MED ORDER — DEXAMETHASONE SODIUM PHOSPHATE 10 MG/ML IJ SOLN
10.0000 mg | Freq: Once | INTRAMUSCULAR | Status: AC
  Administered 2015-03-03: 10 mg via INTRAMUSCULAR
  Filled 2015-03-03: qty 1

## 2015-03-03 MED ORDER — KETOROLAC TROMETHAMINE 60 MG/2ML IM SOLN
60.0000 mg | Freq: Once | INTRAMUSCULAR | Status: AC
  Administered 2015-03-03: 60 mg via INTRAMUSCULAR
  Filled 2015-03-03: qty 2

## 2015-03-03 MED ORDER — HYDROCODONE-ACETAMINOPHEN 5-325 MG PO TABS: 1.0000 | ORAL_TABLET | Freq: Four times a day (QID) | ORAL | Status: DC | PRN

## 2015-03-03 MED ORDER — CYCLOBENZAPRINE HCL 10 MG PO TABS: 10.0000 mg | ORAL_TABLET | Freq: Three times a day (TID) | ORAL | Status: DC | PRN

## 2015-03-03 NOTE — ED Notes (Signed)
Patient is alert and orientedx4.  Patient was explained discharge instructions and they understood them with no questions.   

## 2015-03-03 NOTE — Discharge Instructions (Signed)
Back Pain, Adult Back pain is very common in adults.The cause of back pain is rarely dangerous and the pain often gets better over time.The cause of your back pain may not be known. Some common causes of back pain include:  Strain of the muscles or ligaments supporting the spine.  Wear and tear (degeneration) of the spinal disks.  Arthritis.  Direct injury to the back. For many people, back pain may return. Since back pain is rarely dangerous, most people can learn to manage this condition on their own. HOME CARE INSTRUCTIONS Watch your back pain for any changes. The following actions may help to lessen any discomfort you are feeling:  Remain active. It is stressful on your back to sit or stand in one place for long periods of time. Do not sit, drive, or stand in one place for more than 30 minutes at a time. Take short walks on even surfaces as soon as you are able.Try to increase the length of time you walk each day.  Exercise regularly as directed by your health care provider. Exercise helps your back heal faster. It also helps avoid future injury by keeping your muscles strong and flexible.  Do not stay in bed.Resting more than 1-2 days can delay your recovery.  Pay attention to your body when you bend and lift. The most comfortable positions are those that put less stress on your recovering back. Always use proper lifting techniques, including:  Bending your knees.  Keeping the load close to your body.  Avoiding twisting.  Find a comfortable position to sleep. Use a firm mattress and lie on your side with your knees slightly bent. If you lie on your back, put a pillow under your knees.  Avoid feeling anxious or stressed.Stress increases muscle tension and can worsen back pain.It is important to recognize when you are anxious or stressed and learn ways to manage it, such as with exercise.  Take medicines only as directed by your health care provider. Over-the-counter  medicines to reduce pain and inflammation are often the most helpful.Your health care provider may prescribe muscle relaxant drugs.These medicines help dull your pain so you can more quickly return to your normal activities and healthy exercise.  Apply ice to the injured area:  Put ice in a plastic bag.  Place a towel between your skin and the bag.  Leave the ice on for 20 minutes, 2-3 times a day for the first 2-3 days. After that, ice and heat may be alternated to reduce pain and spasms.  Maintain a healthy weight. Excess weight puts extra stress on your back and makes it difficult to maintain good posture. SEEK MEDICAL CARE IF:  You have pain that is not relieved with rest or medicine.  You have increasing pain going down into the legs or buttocks.  You have pain that does not improve in one week.  You have night pain.  You lose weight.  You have a fever or chills. SEEK IMMEDIATE MEDICAL CARE IF:   You develop new bowel or bladder control problems.  You have unusual weakness or numbness in your arms or legs.  You develop nausea or vomiting.  You develop abdominal pain.  You feel faint.   This information is not intended to replace advice given to you by your health care provider. Make sure you discuss any questions you have with your health care provider.   Document Released: 04/23/2005 Document Revised: 05/14/2014 Document Reviewed: 08/25/2013 Elsevier Interactive Patient Education 2016 Elsevier  Inc[employer]  Back Injury Prevention Back injuries can be very painful. They can also be difficult to heal. After having one back injury, you are more likely to injure your back again. It is important to learn how to avoid injuring or re-injuring your back. The following tips can help you to prevent a back injury. WHAT SHOULD I KNOW ABOUT PHYSICAL FITNESS?  Exercise for 30 minutes per day on most days of the week or as directed by your health care provider. Make sure to:  Do  aerobic exercises, such as walking, jogging, biking, or swimming.  Do exercises that increase balance and strength, such as tai chi and yoga. These can decrease your risk of falling and injuring your back.  Do stretching exercises to help with flexibility.  Try to develop strong abdominal muscles. Your abdominal muscles provide a lot of the support that is needed by your back.  Maintain a healthy weight. This helps to decrease your risk of a back injury. WHAT SHOULD I KNOW ABOUT MY DIET?  Talk with your health care provider about your overall diet. Take supplements and vitamins only as directed by your health care provider.  Talk with your health care provider about how much calcium and vitamin D you need each day. These nutrients help to prevent weakening of the bones (osteoporosis). Osteoporosis can cause broken (fractured) bones, which lead to back pain.  Include good sources of calcium in your diet, such as dairy products, green leafy vegetables, and products that have had calcium added to them (fortified).  Include good sources of vitamin D in your diet, such as milk and foods that are fortified with vitamin D. WHAT SHOULD I KNOW ABOUT MY POSTURE?  Sit up straight and stand up straight. Avoid leaning forward when you sit or hunching over when you stand.  Choose chairs that have good low-back (lumbar) support.  If you work at a desk, sit close to it so you do not need to lean over. Keep your chin tucked in. Keep your neck drawn back, and keep your elbows bent at a right angle. Your arms should look like the letter "L."  Sit high and close to the steering wheel when you drive. Add a lumbar support to your car seat, if needed.  Avoid sitting or standing in one position for very long. Take breaks to get up, stretch, and walk around at least one time every hour. Take breaks every hour if you are driving for long periods of time.  Sleep on your side with your knees slightly bent, or  sleep on your back with a pillow under your knees. Do not lie on the front of your body to sleep. WHAT SHOULD I KNOW ABOUT LIFTING, TWISTING, AND REACHING? Lifting and Heavy Lifting  Avoid heavy lifting, especially repetitive heavy lifting. If you must do heavy lifting:  Stretch before lifting.  Work slowly.  Rest between lifts.  Use a tool such as a cart or a dolly to move objects if one is available.  Make several small trips instead of carrying one heavy load.  Ask for help when you need it, especially when moving big objects.  Follow these steps when lifting:  Stand with your feet shoulder-width apart.  Get as close to the object as you can. Do not try to pick up a heavy object that is far from your body.  Use handles or lifting straps if they are available.  Bend at your knees. Squat down, but keep your heels  off the floor.  Keep your shoulders pulled back, your chin tucked in, and your back straight.  Lift the object slowly while you tighten the muscles in your legs, abdomen, and buttocks. Keep the object as close to the center of your body as possible.  Follow these steps when putting down a heavy load:  Stand with your feet shoulder-width apart.  Lower the object slowly while you tighten the muscles in your legs, abdomen, and buttocks. Keep the object as close to the center of your body as possible.  Keep your shoulders pulled back, your chin tucked in, and your back straight.  Bend at your knees. Squat down, but keep your heels off the floor.  Use handles or lifting straps if they are available. Twisting and Reaching  Avoid lifting heavy objects above your waist.  Do not twist at your waist while you are lifting or carrying a load. If you need to turn, move your feet.  Do not bend over without bending at your knees.  Avoid reaching over your head, across a table, or for an object on a high surface. WHAT ARE SOME OTHER TIPS?  Avoid wet floors and icy  ground. Keep sidewalks clear of ice to prevent falls.  Do not sleep on a mattress that is too soft or too hard.  Keep items that are used frequently within easy reach.  Put heavier objects on shelves at waist level, and put lighter objects on lower or higher shelves.  Find ways to decrease your stress, such as exercise, massage, or relaxation techniques. Stress can build up in your muscles. Tense muscles are more vulnerable to injury.  Talk with your health care provider if you feel anxious or depressed. These conditions can make back pain worse.  Wear flat heel shoes with cushioned soles.  Avoid sudden movements.  Use both shoulder straps when carrying a backpack.  Do not use any tobacco products, including cigarettes, chewing tobacco, or electronic cigarettes. If you need help quitting, ask your health care provider.   This information is not intended to replace advice given to you by your health care provider. Make sure you discuss any questions you have with your health care provider.   Document Released: 05/31/2004 Document Revised: 09/07/2014 Document Reviewed: 04/27/2014 Elsevier Interactive Patient Education 2016 Elsevier Inc.   Panic Attacks Panic attacks are sudden, short-livedsurges of severe anxiety, fear, or discomfort. They may occur for no reason when you are relaxed, when you are anxious, or when you are sleeping. Panic attacks may occur for a number of reasons:   Healthy people occasionally have panic attacks in extreme, life-threatening situations, such as war or natural disasters. Normal anxiety is a protective mechanism of the body that helps [place] react to danger (fight or flight response).  Panic attacks are often seen with anxiety disorders, such as panic disorder, social anxiety disorder, generalized anxiety disorder, and phobias. Anxiety disorders cause excessive or uncontrollable anxiety. They may interfere with your relationships or other life  activities.  Panic attacks are sometimes seen with other mental illnesses, such as depression and posttraumatic stress disorder.  Certain medical conditions, prescription medicines, and drugs of abuse can cause panic attacks. SYMPTOMS  Panic attacks start suddenly, peak within 20 minutes, and are accompanied by four or more of the following symptoms:  Pounding heart or fast heart rate (palpitations).  Sweating.  Trembling or shaking.  Shortness of breath or feeling smothered.  Feeling choked.  Chest pain or discomfort.  Nausea or strange  feeling in your stomach.  Dizziness, light-headedness, or feeling like you will faint.  Chills or hot flushes.  Numbness or tingling in your lips or hands and feet.  Feeling that things are not real or feeling that you are not yourself.  Fear of losing control or going crazy.  Fear of dying. Some of these symptoms can mimic serious medical conditions. For example, you may think you are having a heart attack. Although panic attacks can be very scary, they are not life threatening. DIAGNOSIS  Panic attacks are diagnosed through an assessment by your health care provider. Your health care provider will ask questions about your symptoms, such as where and when they occurred. Your health care provider will also ask about your medical history and use of alcohol and drugs, including prescription medicines. Your health care provider may order blood tests or other studies to rule out a serious medical condition. Your health care provider may refer you to a mental health professional for further evaluation. TREATMENT   Most healthy people who have one or two panic attacks in an extreme, life-threatening situation will not require treatment.  The treatment for panic attacks associated with anxiety disorders or other mental illness typically involves counseling with a mental health professional, medicine, or a combination of both. Your health care  provider will help determine what treatment is best for you.  Panic attacks due to physical illness usually go away with treatment of the illness. If prescription medicine is causing panic attacks, talk with your health care provider about stopping the medicine, decreasing the dose, or substituting another medicine.  Panic attacks due to alcohol or drug abuse go away with abstinence. Some adults need professional help in order to stop drinking or using drugs. HOME CARE INSTRUCTIONS   Take all medicines as directed by your health care provider.   Schedule and attend follow-up visits as directed by your health care provider. It is important to keep all your appointments. SEEK MEDICAL CARE IF:  You are not able to take your medicines as prescribed.  Your symptoms do not improve or get worse. SEEK IMMEDIATE MEDICAL CARE IF:   You experience panic attack symptoms that are different than your usual symptoms.  You have serious thoughts about hurting yourself or others.  You are taking medicine for panic attacks and have a serious side effect. MAKE SURE YOU:  Understand these instructions.  Will watch your condition.  Will get help right away if you are not doing well or get worse.   This information is not intended to replace advice given to you by your health care provider. Make sure you discuss any questions you have with your health care provider.   Document Released: 04/23/2005 Document Revised: 04/28/2013 Document Reviewed: 12/05/2012 Elsevier Interactive Patient Education [employer].
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
## 2015-04-13 ENCOUNTER — Other Ambulatory Visit: Payer: Self-pay | Admitting: Family Medicine

## 2015-06-06 ENCOUNTER — Emergency Department (HOSPITAL_COMMUNITY)
Admission: EM | Admit: 2015-06-06 | Discharge: 2015-06-06 | Disposition: A | Discharge status: Home / Self Care | Payer: Self-pay | Attending: Emergency Medicine | Admitting: Emergency Medicine

## 2015-06-06 ENCOUNTER — Emergency Department (HOSPITAL_COMMUNITY): Payer: Self-pay

## 2015-06-06 ENCOUNTER — Encounter (HOSPITAL_COMMUNITY): Payer: Self-pay | Admitting: Emergency Medicine

## 2015-06-06 DIAGNOSIS — R0789 Other chest pain: Secondary | ICD-10-CM | POA: Insufficient documentation

## 2015-06-06 DIAGNOSIS — Z87438 Personal history of other diseases of male genital organs: Secondary | ICD-10-CM | POA: Insufficient documentation

## 2015-06-06 DIAGNOSIS — R0602 Shortness of breath: Secondary | ICD-10-CM | POA: Insufficient documentation

## 2015-06-06 DIAGNOSIS — F419 Anxiety disorder, unspecified: Secondary | ICD-10-CM | POA: Insufficient documentation

## 2015-06-06 DIAGNOSIS — Z8669 Personal history of other diseases of the nervous system and sense organs: Secondary | ICD-10-CM | POA: Insufficient documentation

## 2015-06-06 DIAGNOSIS — Z8719 Personal history of other diseases of the digestive system: Secondary | ICD-10-CM | POA: Insufficient documentation

## 2015-06-06 DIAGNOSIS — Z880 Allergy status to penicillin: Secondary | ICD-10-CM | POA: Insufficient documentation

## 2015-06-06 DIAGNOSIS — Z79899 Other long term (current) drug therapy: Secondary | ICD-10-CM | POA: Insufficient documentation

## 2015-06-06 DIAGNOSIS — I10 Essential (primary) hypertension: Secondary | ICD-10-CM | POA: Insufficient documentation

## 2015-06-06 LAB — BASIC METABOLIC PANEL
Anion gap: 13 (ref 5–15)
BUN: 16 mg/dL (ref 6–20)
CALCIUM: 10 mg/dL (ref 8.9–10.3)
CHLORIDE: 103 mmol/L (ref 101–111)
CO2: 25 mmol/L (ref 22–32)
CREATININE: 1.12 mg/dL (ref 0.61–1.24)
GFR calc Af Amer: >60 mL/min (ref >60)
GFR calc non Af Amer: >60 mL/min (ref >60)
Glucose, Bld: 108 mg/dL — ABNORMAL HIGH (ref 65–99)
Potassium: 3.9 mmol/L (ref 3.5–5.1)
SODIUM: 141 mmol/L (ref 135–145)

## 2015-06-06 LAB — CBC
HEMATOCRIT: 46.6 % (ref 39.0–52.0)
HEMOGLOBIN: 16.3 g/dL (ref 13.0–17.0)
MCH: 29.2 pg (ref 26.0–34.0)
MCHC: 35 g/dL (ref 30.0–36.0)
MCV: 83.5 fL (ref 78.0–100.0)
Platelets: 270 10*3/uL (ref 150–400)
RBC: 5.58 MIL/uL (ref 4.22–5.81)
RDW: 12.3 % (ref 11.5–15.5)
WBC: 8.7 10*3/uL (ref 4.0–10.5)

## 2015-06-06 LAB — I-STAT TROPONIN, ED: TROPONIN I, POC: 0 ng/mL (ref 0.00–0.08)

## 2015-06-06 MED ORDER — ALPRAZOLAM 0.25 MG PO TABS
0.2500 mg | ORAL_TABLET | Freq: Once | ORAL | Status: AC
  Administered 2015-06-06: 0.25 mg via ORAL
  Filled 2015-06-06: qty 1

## 2015-06-06 NOTE — ED Provider Notes (Signed)
CSN: [id number]     Arrival date & time 06/06/15  1054 History   First MD Initiated Contact with Patient 06/06/15 1106     Chief Complaint  Patient presents with  . Hypertension  . Chest Pain     (Consider location/radiation/quality/duration/timing/severity/associated sxs/prior Treatment) HPI With anterior chest pressure constant for 1.5 weeks. Not made better or worse with anything. symptoms, and by slight headache. No nausea no sweatiness he does admit to shortness of breath. No other associated symptoms no treatment prior to coming here. He reported he took his blood pressure at home and it was 0000000 systolic. Discomfort as moderate at present, constant. He does admit to feeling anxious No other associated symptoms.  Past Medical History  Diagnosis Date  . Palpitations   . Family history of stress   . Prostatitis, unspecified   . Anxiety state, unspecified   . Unspecified cardiovascular disease   . IBS (irritable bowel syndrome)   . Visual disturbance   . Esophageal reflux     no meds. per pt.  . Generalized headaches   . Major depressive disorder, single episode, unspecified ([place])   . Panic disorder without agoraphobia   . Hypertension   . RBBB (right bundle branch block)   . Hiatal hernia     pt. denies nerve/mucscle disease  . Sleep apnea    Past Surgical History  Procedure Laterality Date  . Pyloric stenosis     . 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 Right arm      fracture--Right arm fracture s/p ORIF from roller blading.  . Esophagogastroduodenoscopy  01/03/2005    normal  . Fracture surgery  2000    left arm  . Inguinal hernia repair  03/14/2011    Procedure: LAPAROSCOPIC BILATERAL INGUINAL HERNIA REPAIR;  Surgeon: Harl Bowie, MD;  Location: [facility];  Service: General;  Laterality: N/A;  with mesh  . Hernia repair  03/14/2011    BIH  . Colonoscopy  07/26/2005    normal   Family History  Problem Relation Age of  Onset  . Heart attack Mother 66    PTCA  and 3 stents  . Heart disease Mother   . Hodgkin's lymphoma Maternal Grandmother   . Cancer Maternal Aunt     pt unaware of what kind  . Cancer Maternal Uncle     pt unaware of what kind  . Pyloric stenosis Daughter 38    X2  . Anxiety disorder    . Bipolar disorder Daughter    Social History  Substance Use Topics  . Smoking status: Never Smoker   . Smokeless tobacco: Never Used  . Alcohol Use: No    Review of Systems  Constitutional: Negative.   HENT: Negative.   Respiratory: Positive for shortness of breath.   Cardiovascular: Positive for chest pain.  Gastrointestinal: Negative.   Musculoskeletal: Negative.   Skin: Negative.   Neurological: Negative.   Psychiatric/Behavioral: The patient is nervous/anxious.   All other systems reviewed and are negative.     Allergies  Hctz and Penicillins  Home Medications   Prior to Admission medications   Medication Sig Start Date End Date Taking? Authorizing Provider  ALPRAZolam (XANAX) 0.25 MG tablet Take 0.25 mg by mouth 3 (three) times daily as needed for anxiety.    Historical Provider, MD  cyclobenzaprine (FLEXERIL) 10 MG tablet Take 1 tablet (10 mg total) by mouth 3 (three) times daily  as needed for muscle spasms. 03/03/15   Margarita Mail, PA-C  HYDROcodone-acetaminophen (NORCO/VICODIN) 5-325 MG tablet Take 1-2 tablets by mouth every 6 (six) hours as needed for moderate pain or severe pain. 03/03/15   Margarita Mail, PA-C  meclizine (ANTIVERT) 12.5 MG tablet Take 1 tablet (12.5 mg total) by mouth 3 (three) times daily as needed (light headedness). 12/16/13   Archie Patten, MD  methocarbamol (ROBAXIN) 500 MG tablet Take 1 tablet (500 mg total) by mouth 2 (two) times daily. 05/16/14   Hannah Muthersbaugh, PA-C  metoprolol succinate (TOPROL-XL) 25 MG 24 hr tablet Take 25 mg by mouth daily.    Historical Provider, MD  metoprolol succinate (TOPROL-XL) 50 MG 24 hr tablet TAKE ONE TABLET  BY MOUTH DAILY. TAKE WITH OR IMMEDIATELY FOLLOWING A MEAL. 04/13/15   Archie Patten, MD  terbinafine (LAMISIL) 250 MG tablet Take 1 tablet (250 mg total) by mouth daily. 01/18/14   Archie Patten, MD   BP 154/98 mmHg  Pulse 87  Temp(Src) 98.8 F (37.1 C) (Oral)  Resp 18  SpO2 100% Physical Exam  Constitutional: He appears well-developed and well-nourished. No distress.  Mildly anxious appearing  HENT:  Head: Normocephalic and atraumatic.  Eyes: Conjunctivae are normal. Pupils are equal, round, and reactive to light.  Neck: Neck supple. No tracheal deviation present. No thyromegaly present.  Cardiovascular: Normal rate and regular rhythm.   No murmur heard. Pulmonary/Chest: Effort normal and breath sounds normal.  Abdominal: Soft. Bowel sounds are normal. He exhibits no distension. There is no tenderness.  Musculoskeletal: Normal range of motion. He exhibits no edema or tenderness.  Neurological: He is alert. Coordination normal.  Skin: Skin is warm and dry. No rash noted.  Psychiatric:  Mildly anxious appearing  Nursing note and vitals reviewed.   ED Course  Procedures (including critical care time) Labs Review Labs Reviewed  BASIC METABOLIC PANEL    Imaging Review No results found. I have personally reviewed and evaluated these images and lab results as part of my medical decision-making.   EKG Interpretation   Date/Time:  Monday June 06 2015 10:58:15 EST Ventricular Rate:  83 PR Interval:  160 QRS Duration: 76 QT Interval:  378 QTC Calculation: 444 R Axis:   20 Text Interpretation:  Normal sinus rhythm Normal ECG No significant change  since last tracing Confirmed by Mckinzee Spirito  MD, Nishtha Raider ([id number]) on 06/06/2015  11:07:21 AM     1 PM patient feels much improved after treatment with Xanax. Chest x-ray viewed by me. Results for orders placed or performed during the hospital encounter of [id number]  Basic metabolic panel  Result Value Ref Range   Sodium 141  135 - 145 mmol/L   Potassium 3.9 3.5 - 5.1 mmol/L   Chloride 103 101 - 111 mmol/L   CO2 25 22 - 32 mmol/L   Glucose, Bld 108 (H) 65 - 99 mg/dL   BUN 16 6 - 20 mg/dL   Creatinine, Ser 1.12 0.61 - 1.24 mg/dL   Calcium 10.0 8.9 - 10.3 mg/dL   GFR calc non Af Amer >60 >60 mL/min   GFR calc Af Amer >60 >60 mL/min   Anion gap 13 5 - 15  CBC  Result Value Ref Range   WBC 8.7 4.0 - 10.5 K/uL   RBC 5.58 4.22 - 5.81 MIL/uL   Hemoglobin 16.3 13.0 - 17.0 g/dL   HCT 46.6 39.0 - 52.0 %   MCV 83.5 78.0 - 100.0 fL   MCH  29.2 26.0 - 34.0 pg   MCHC 35.0 30.0 - 36.0 g/dL   RDW 12.3 11.5 - 15.5 %   Platelets 270 150 - 400 K/uL  I-stat troponin, ED (not at [facility])  Result Value Ref Range   Troponin i, poc 0.00 0.00 - 0.08 ng/mL   Comment 3           Dg Chest 2 View  06/06/2015  CLINICAL DATA:  Headache, chest pressure for 1 week, congestion for 2 days EXAM: CHEST  2 VIEW COMPARISON:  09/14/2013 FINDINGS: The heart size and mediastinal contours are within normal limits. Both lungs are clear. The visualized skeletal structures are unremarkable. IMPRESSION: No active cardiopulmonary disease. Electronically Signed   By: Kathreen Devoid   On: 06/06/2015 12:13    MDM  Story highly atypical for acute coronary syndrome. Nonacute EKG, negative troponin after constant chest pain for 1.5 weeks. Heart score equals 3.I feel pts sx largely due to anxiety Plan follow-up with PMD. He has an appointment scheduled for March. Case management has also interviewed patient and will give him alternatives to find new primary care physician if he desires Diagnosis atypical chest pain Final diagnoses:  None        Orlie Dakin, MD 06/06/15 1306

## 2015-06-06 NOTE — ED Notes (Signed)
Pt sts htn with HA and chest pressure x 1 week; pt sts taking his htn meds

## 2015-06-06 NOTE — Progress Notes (Signed)
Spoke to patient regarding primary care resources and the [facility] orange card. Patient is an orange card holder with [facility] on [address]. Pt expressed several concerns regarding his pcp. Patients orange card has recently expired. Patient was informed that he could be assigned to a different location and pcp, pt verbalized understanding plan. Patient will contact me once discharged for an eligibility appointment. Orange card application provided and explained. My contact information provided for any future questions or concerns. No other [place] needs identified at this time.   [place] [facility] [phone]

## 2015-06-06 NOTE — Discharge Instructions (Signed)
Nonspecific Chest Pain Keep your scheduled appointment with your primary care physician in March 2017 or go to any of the primary care physician's to whom you referred by our case manager. Return if concerned for any reason. It is often hard to find the cause of chest pain. There is always a chance that your pain could be related to something serious, such as a heart attack or a blood clot in your lungs. Chest pain can also be caused by conditions that are not life-threatening. If you have chest pain, it is very important to follow up with your doctor.  HOME CARE  If you were prescribed an antibiotic medicine, finish it all even if you start to feel better.  Avoid any activities that cause chest pain.  Do not use any tobacco products, including cigarettes, chewing tobacco, or electronic cigarettes. If you need help quitting, ask your doctor.  Do not drink alcohol.  Take medicines only as told by your doctor.  Keep all follow-up visits as told by your doctor. This is important. This includes any further testing if your chest pain does not go away.  Your doctor may tell you to keep your head raised (elevated) while you sleep.  Make lifestyle changes as told by your doctor. These may include:  Getting regular exercise. Ask your doctor to suggest some activities that are safe for you.  Eating a heart-healthy diet. Your doctor or a diet specialist (dietitian) can help you to learn healthy eating options.  Maintaining a healthy weight.  Managing diabetes, if necessary.  Reducing stress. GET HELP IF:  Your chest pain does not go away, even after treatment.  You have a rash with blisters on your chest.  You have a fever. GET HELP RIGHT AWAY IF:  Your chest pain is worse.  You have an increasing cough, or you cough up blood.  You have severe belly (abdominal) pain.  You feel extremely weak.  You pass out (faint).  You have chills.  You have sudden, unexplained chest  discomfort.  You have sudden, unexplained discomfort in your arms, back, neck, or jaw.  You have shortness of breath at any time.  You suddenly start to sweat, or your skin gets clammy.  You feel nauseous.  You vomit.  You suddenly feel light-headed or dizzy.  Your heart begins to beat quickly, or it feels like it is skipping beats. These symptoms may be an emergency. Do not wait to see if the symptoms will go away. Get medical help right away. Call your local emergency services (911 in the [place] Do not drive yourself to the hospital.   This information is not intended to replace advice given to you by your health care provider. Make sure you discuss any questions you have with your health care provider.   Document Released: 10/10/2007 Document Revised: 05/14/2014 Document Reviewed: 11/27/2013 Elsevier Interactive Patient Education [employer].

## 2015-06-06 NOTE — ED Notes (Signed)
Pt verbalized understanding of d/c instructions and follow-up care. No further questions/concerns, VSS, ambulatory w/ steady gait (refused wheelchair) 

## 2015-06-07 ENCOUNTER — Other Ambulatory Visit: Payer: Self-pay | Admitting: Family Medicine

## 2015-06-07 NOTE — Telephone Encounter (Signed)
Refilled metoprolol x 2 months. Patient has not been seen for a f/u on his hypertension in over a year, please have him make a follow up appt.  Thanks, Archie Patten, MD [facility] Resident  06/07/2015, 1:03 PM

## 2015-06-20 NOTE — Telephone Encounter (Signed)
Left message on vm for patient to call back and schedule appointment for further refills.

## 2015-09-06 ENCOUNTER — Encounter: Payer: Self-pay | Admitting: Internal Medicine

## 2015-10-23 ENCOUNTER — Encounter (HOSPITAL_COMMUNITY): Payer: Self-pay

## 2015-10-23 ENCOUNTER — Emergency Department (HOSPITAL_COMMUNITY)
Admission: EM | Admit: 2015-10-23 | Discharge: 2015-10-23 | Disposition: A | Discharge status: Home / Self Care | Payer: Self-pay | Attending: Emergency Medicine | Admitting: Emergency Medicine

## 2015-10-23 ENCOUNTER — Emergency Department (HOSPITAL_COMMUNITY): Payer: Self-pay

## 2015-10-23 DIAGNOSIS — I10 Essential (primary) hypertension: Secondary | ICD-10-CM | POA: Insufficient documentation

## 2015-10-23 DIAGNOSIS — F329 Major depressive disorder, single episode, unspecified: Secondary | ICD-10-CM | POA: Insufficient documentation

## 2015-10-23 DIAGNOSIS — K5732 Diverticulitis of large intestine without perforation or abscess without bleeding: Secondary | ICD-10-CM | POA: Insufficient documentation

## 2015-10-23 LAB — URINALYSIS, ROUTINE W REFLEX MICROSCOPIC
Bilirubin Urine: NEGATIVE
GLUCOSE, UA: NEGATIVE mg/dL
Hgb urine dipstick: NEGATIVE
KETONES UR: NEGATIVE mg/dL
LEUKOCYTES UA: NEGATIVE
NITRITE: NEGATIVE
PROTEIN: NEGATIVE mg/dL
Specific Gravity, Urine: 1.018 (ref 1.005–1.030)
pH: 6 (ref 5.0–8.0)

## 2015-10-23 LAB — CBC WITH DIFFERENTIAL/PLATELET
BASOS ABS: 0 10*3/uL (ref 0.0–0.1)
BASOS PCT: 0 %
EOS PCT: 2 %
Eosinophils Absolute: 0.2 10*3/uL (ref 0.0–0.7)
HCT: 44.1 % (ref 39.0–52.0)
Hemoglobin: 14.7 g/dL (ref 13.0–17.0)
LYMPHS PCT: 12 %
Lymphs Abs: 1.3 10*3/uL (ref 0.7–4.0)
MCH: 27.9 pg (ref 26.0–34.0)
MCHC: 33.3 g/dL (ref 30.0–36.0)
MCV: 83.7 fL (ref 78.0–100.0)
MONO ABS: 1.3 10*3/uL — AB (ref 0.1–1.0)
Monocytes Relative: 12 %
NEUTROS ABS: 7.8 10*3/uL — AB (ref 1.7–7.7)
Neutrophils Relative %: 74 %
PLATELETS: 203 10*3/uL (ref 150–400)
RBC: 5.27 MIL/uL (ref 4.22–5.81)
RDW: 12.7 % (ref 11.5–15.5)
WBC: 10.5 10*3/uL (ref 4.0–10.5)

## 2015-10-23 LAB — COMPREHENSIVE METABOLIC PANEL
ALBUMIN: 3.6 g/dL (ref 3.5–5.0)
ALK PHOS: 81 U/L (ref 38–126)
ALT: 16 U/L — AB (ref 17–63)
AST: 15 U/L (ref 15–41)
Anion gap: 6 (ref 5–15)
BILIRUBIN TOTAL: 0.6 mg/dL (ref 0.3–1.2)
BUN: 13 mg/dL (ref 6–20)
CALCIUM: 9.1 mg/dL (ref 8.9–10.3)
CO2: 26 mmol/L (ref 22–32)
CREATININE: 1.13 mg/dL (ref 0.61–1.24)
Chloride: 105 mmol/L (ref 101–111)
GFR calc Af Amer: >60 mL/min (ref >60)
GLUCOSE: 118 mg/dL — AB (ref 65–99)
Potassium: 4 mmol/L (ref 3.5–5.1)
Sodium: 137 mmol/L (ref 135–145)
TOTAL PROTEIN: 6.5 g/dL (ref 6.5–8.1)

## 2015-10-23 MED ORDER — IOPAMIDOL (ISOVUE-300) INJECTION 61%
INTRAVENOUS | Status: AC
  Administered 2015-10-23: 100 mL
  Filled 2015-10-23: qty 100

## 2015-10-23 MED ORDER — METRONIDAZOLE IN NACL 5-0.79 MG/ML-% IV SOLN
500.0000 mg | Freq: Once | INTRAVENOUS | Status: AC
  Administered 2015-10-23: 500 mg via INTRAVENOUS
  Filled 2015-10-23: qty 100

## 2015-10-23 MED ORDER — MORPHINE SULFATE (PF) 4 MG/ML IV SOLN
4.0000 mg | Freq: Once | INTRAVENOUS | Status: AC
  Administered 2015-10-23: 4 mg via INTRAVENOUS
  Filled 2015-10-23: qty 1

## 2015-10-23 MED ORDER — HYDROCODONE-ACETAMINOPHEN 5-325 MG PO TABS: 2.0000 | ORAL_TABLET | ORAL | Status: DC | PRN

## 2015-10-23 MED ORDER — SODIUM CHLORIDE 0.9 % IV BOLUS (SEPSIS)
1000.0000 mL | Freq: Once | INTRAVENOUS | Status: AC
  Administered 2015-10-23: 1000 mL via INTRAVENOUS

## 2015-10-23 MED ORDER — CIPROFLOXACIN IN D5W 400 MG/200ML IV SOLN
400.0000 mg | Freq: Once | INTRAVENOUS | Status: AC
  Administered 2015-10-23: 400 mg via INTRAVENOUS
  Filled 2015-10-23: qty 200

## 2015-10-23 MED ORDER — ONDANSETRON HCL 4 MG/2ML IJ SOLN
4.0000 mg | Freq: Once | INTRAMUSCULAR | Status: AC
  Administered 2015-10-23: 4 mg via INTRAVENOUS
  Filled 2015-10-23: qty 2

## 2015-10-23 MED ORDER — METRONIDAZOLE 500 MG PO TABS
500.0000 mg | ORAL_TABLET | Freq: Three times a day (TID) | ORAL | Status: DC
Start: 2015-10-23 — End: 2016-01-07

## 2015-10-23 MED ORDER — CIPROFLOXACIN HCL 500 MG PO TABS: 500.0000 mg | ORAL_TABLET | Freq: Two times a day (BID) | ORAL | Status: DC

## 2015-10-23 MED ORDER — DOCUSATE SODIUM 100 MG PO CAPS: 100.0000 mg | ORAL_CAPSULE | Freq: Two times a day (BID) | ORAL | Status: DC

## 2015-10-23 NOTE — ED Provider Notes (Signed)
CSN: [id number]     Arrival date & time 10/23/15  0731 History   First MD Initiated Contact with Patient 10/23/15 [phone]     Chief Complaint  Patient presents with  . Abdominal Pain     (Consider location/radiation/quality/duration/timing/severity/associated sxs/prior Treatment) HPI   Patient is a 50 year old male with a history of prostatitis, anxiety, IBS, diverticulosis, depression, HTN, bilateral inguinal hernia repair who presents the ED with suprapubic pain for 2 days. Pain has progressively gotten worse it is constant, it radiates into his left lower quadrant, his testicles and his rectum, 10/10, burning/stabbing in nature, isn't taking anything for pain. Associated dysuria, nausea, diarrhea with mucus and blood streaks, difficulty starting urine stream. Patient states this feels similar to his prostatitis in the past. Patient states it feels as though he has to have a bowel movement but then cannot. He denies hematuria, penile discharge, vomiting, fever, chills, chest pain, shortness of breath, new sexual partner.   Past Medical History  Diagnosis Date  . Palpitations   . Family history of stress   . Prostatitis, unspecified   . Anxiety state, unspecified   . Unspecified cardiovascular disease   . IBS (irritable bowel syndrome)   . Visual disturbance   . Esophageal reflux     no meds. per pt.  . Generalized headaches   . Major depressive disorder, single episode, unspecified ([place])   . Panic disorder without agoraphobia   . Hypertension   . RBBB (right bundle branch block)   . Hiatal hernia     pt. denies nerve/mucscle disease  . Sleep apnea    Past Surgical History  Procedure Laterality Date  . Pyloric stenosis     . 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 Right arm      fracture--Right arm fracture s/p ORIF from roller blading.  . Esophagogastroduodenoscopy  01/03/2005    normal  . Fracture surgery  2000    left arm  . Inguinal  hernia repair  03/14/2011    Procedure: LAPAROSCOPIC BILATERAL INGUINAL HERNIA REPAIR;  Surgeon: Harl Bowie, MD;  Location: [facility];  Service: General;  Laterality: N/A;  with mesh  . Hernia repair  03/14/2011    BIH  . Colonoscopy  07/26/2005    normal   Family History  Problem Relation Age of Onset  . Heart attack Mother 77    PTCA  and 3 stents  . Heart disease Mother   . Hodgkin's lymphoma Maternal Grandmother   . Cancer Maternal Aunt     pt unaware of what kind  . Cancer Maternal Uncle     pt unaware of what kind  . Pyloric stenosis Daughter 38    X2  . Anxiety disorder    . Bipolar disorder Daughter    Social History  Substance Use Topics  . Smoking status: Never Smoker   . Smokeless tobacco: Never Used  . Alcohol Use: No    Review of Systems  Constitutional: Negative for fever and chills.  Eyes: Negative for visual disturbance.  Respiratory: Negative for chest tightness and shortness of breath.   Cardiovascular: Negative for chest pain.  Gastrointestinal: Positive for nausea, abdominal pain, diarrhea, blood in stool and rectal pain. Negative for vomiting and abdominal distention.  Genitourinary: Positive for dysuria and difficulty urinating. Negative for hematuria, flank pain, discharge, penile swelling, scrotal swelling, penile pain and testicular pain.  Musculoskeletal: Negative for back pain.  Skin: Negative  for rash.  Allergic/Immunologic: Negative for immunocompromised state.  Neurological: Negative for syncope and headaches.      Allergies  Hctz and Penicillins  Home Medications   Prior to Admission medications   Medication Sig Start Date End Date Taking? Authorizing Provider  ALPRAZolam (XANAX) 0.25 MG tablet Take 0.25 mg by mouth 3 (three) times daily as needed for anxiety.   Yes Historical Provider, MD  metoprolol succinate (TOPROL-XL) 25 MG 24 hr tablet Take 25 mg by mouth daily.   Yes Historical Provider, MD  ciprofloxacin (CIPRO) 500 MG  tablet Take 1 tablet (500 mg total) by mouth 2 (two) times daily. 10/23/15   Kalman Drape, PA  docusate sodium (COLACE) 100 MG capsule Take 1 capsule (100 mg total) by mouth every 12 (twelve) hours. 10/23/15   Kalman Drape, PA  HYDROcodone-acetaminophen (NORCO/VICODIN) 5-325 MG tablet Take 2 tablets by mouth every 4 (four) hours as needed. 10/23/15   Kalman Drape, PA  metroNIDAZOLE (FLAGYL) 500 MG tablet Take 1 tablet (500 mg total) by mouth 3 (three) times daily. 10/23/15   Arliene Rosenow L Genevieve Ritzel, PA   BP 144/94 mmHg  Pulse 74  Temp(Src) 99 F (37.2 C) (Oral)  Resp 14  SpO2 97% Physical Exam  Constitutional: He appears well-developed and well-nourished. No distress.  HENT:  Head: Normocephalic and atraumatic.  Eyes: Conjunctivae are normal.  Neck: Normal range of motion.  Cardiovascular: Normal rate, regular rhythm and normal heart sounds.  Exam reveals no gallop and no friction rub.   No murmur heard. Pulmonary/Chest: Effort normal and breath sounds normal. No respiratory distress. He has no wheezes. He has no rales.  Abdominal: Soft. Normal appearance and bowel sounds are normal. He exhibits no distension. There is tenderness in the suprapubic area and left lower quadrant. There is no rigidity, no rebound, no guarding and no CVA tenderness.  Genitourinary: Penis normal. Right testis shows no mass, no swelling and no tenderness. Left testis shows no mass, no swelling and no tenderness. Circumcised. No penile tenderness. No discharge found.  Musculoskeletal: Normal range of motion.  Neurological: He is alert. Coordination normal.  Skin: Skin is warm and dry. No rash noted. He is not diaphoretic.  Psychiatric: He has a normal mood and affect. His behavior is normal.  Nursing note and vitals reviewed.   ED Course  Procedures (including critical care time) Labs Review Labs Reviewed  COMPREHENSIVE METABOLIC PANEL - Abnormal; Notable for the following:    Glucose, Bld 118 (*)    ALT 16  (*)    All other components within normal limits  CBC WITH DIFFERENTIAL/PLATELET - Abnormal; Notable for the following:    Neutro Abs 7.8 (*)    Monocytes Absolute 1.3 (*)    All other components within normal limits  URINALYSIS, ROUTINE W REFLEX MICROSCOPIC (NOT AT [facility])    Imaging Review Ct Abdomen Pelvis W Contrast  10/23/2015  EXAM: CT ABDOMEN AND PELVIS WITH CONTRAST TECHNIQUE: Multidetector CT imaging of the abdomen and pelvis was performed using the standard protocol following bolus administration of intravenous contrast. CONTRAST:  1 ISOVUE-300 IOPAMIDOL (ISOVUE-300) INJECTION 61% COMPARISON:  10/11/2012 FINDINGS: Lung bases: Minor subsegmental atelectasis. Otherwise clear. Heart normal in size. Liver, spleen, gallbladder, pancreas, adrenal glands:  Normal. Kidneys, ureters, bladder:  Normal. Lymph nodes:  No adenopathy. Ascites:  None. Gastrointestinal: There is wall thickening of the midportion of the sigmoid colon centered on an irregular/inflamed diverticulum. There is adjacent inflammatory change in the mesenteric. There is no  extraluminal air or defined fluid collection to suggest an abscess. There multiple other uninflamed diverticula along the sigmoid colon. Remainder of the colon is unremarkable. Stomach and small bowel are unremarkable. Normal appendix visualized. Musculoskeletal: Disc degenerative changes L5-S1. Otherwise unremarkable. IMPRESSION: 1. Acute uncomplicated sigmoid colon diverticulitis. Specifically, no extraluminal air or evidence of an abscess. Electronically Signed   By: Lajean Manes M.D.   On: 10/23/2015 10:34   I have personally reviewed and evaluated these images and lab results as part of my medical decision-making.   EKG Interpretation None      MDM   Final diagnoses:  Diverticulitis of sigmoid colon   Patient is nontoxic, nonseptic appearing, in no apparent distress.  Patient's pain and other symptoms adequately managed in emergency department.   Fluid bolus given.  Labs, imaging and vitals reviewed.  Patient does not meet the SIRS or Sepsis criteria.  On repeat exam patient does not have a surgical abdomin and there are no peritoneal signs.  CT scan revealed sigmoid diverticulitis. Patient was given IV Flagyl and Cipro and discharged with prescriptions for the same by mouth. Patient also given pain meds and stool softener at time of discharge. Instructed the patient to follow liquid diet for 2 days then introduce soft bland foods. Instructed the patient follow up with primary care provider if his dysuria does not resolve. Patient was stable at time of discharge   Patient given strict instructions for follow-up with their primary care physician.  I have also discussed reasons to return immediately to the ER.  Patient expresses understanding and agrees with plan.       Kalman Drape, [place] 10/23/15 [place], MD 10/24/15 [phone]

## 2015-10-23 NOTE — Discharge Instructions (Signed)
Follow up with your primary care provider within 3 days regarding your visit to the emergency department today. Follow a liquid diet today and introduce soft bland foods in 2 days and advance your diet as tolerated. Take your prescriptions as prescribed. Be sure to complete the entire course of both antibiotics. Do no drink alcohol while taking Flagyl.  Return to the Emergency Department if you experience worsening abdominal pain, bloody stools, abdominal bloating, fever, chills, nausea, vomiting.   Diverticulitis Diverticulitis is inflammation or infection of small pouches in your colon that form when you have a condition called diverticulosis. The pouches in your colon are called diverticula. Your colon, or large intestine, is where water is absorbed and stool is formed. Complications of diverticulitis can include:  Bleeding.  Severe infection.  Severe pain.  Perforation of your colon.  Obstruction of your colon. CAUSES  Diverticulitis is caused by bacteria. Diverticulitis happens when stool becomes trapped in diverticula. This allows bacteria to grow in the diverticula, which can lead to inflammation and infection. RISK FACTORS People with diverticulosis are at risk for diverticulitis. Eating a diet that does not include enough fiber from fruits and vegetables may make diverticulitis more likely to develop. SYMPTOMS  Symptoms of diverticulitis may include:  Abdominal pain and tenderness. The pain is normally located on the left side of the abdomen, but may occur in other areas.  Fever and chills.  Bloating.  Cramping.  Nausea.  Vomiting.  Constipation.  Diarrhea.  Blood in your stool. DIAGNOSIS  Your health care provider will ask you about your medical history and do a physical exam. You may need to have tests done because many medical conditions can cause the same symptoms as diverticulitis. Tests may include:  Blood tests.  Urine tests.  Imaging tests of the  abdomen, including X-rays and CT scans. When your condition is under control, your health care provider may recommend that you have a colonoscopy. A colonoscopy can show how severe your diverticula are and whether something else is causing your symptoms. TREATMENT  Most cases of diverticulitis are mild and can be treated at home. Treatment may include:  Taking over-the-counter pain medicines.  Following a clear liquid diet.  Taking antibiotic medicines by mouth for 7-10 days. More severe cases may be treated at a hospital. Treatment may include:  Not eating or drinking.  Taking prescription pain medicine.  Receiving antibiotic medicines through an IV tube.  Receiving fluids and nutrition through an IV tube.  Surgery. HOME CARE INSTRUCTIONS   Follow your health care provider's instructions carefully.  Follow a full liquid diet or other diet as directed by your health care provider. After your symptoms improve, your health care provider may tell you to change your diet. He or she may recommend you eat a high-fiber diet. Fruits and vegetables are good sources of fiber. Fiber makes it easier to pass stool.  Take fiber supplements or probiotics as directed by your health care provider.  Only take medicines as directed by your health care provider.  Keep all your follow-up appointments. SEEK MEDICAL CARE IF:   Your pain does not improve.  You have a hard time eating food.  Your bowel movements do not return to normal. SEEK IMMEDIATE MEDICAL CARE IF:   Your pain becomes worse.  Your symptoms do not get better.  Your symptoms suddenly get worse.  You have a fever.  You have repeated vomiting.  You have bloody or black, tarry stools. MAKE SURE YOU:  Understand these instructions.  Will watch your condition.  Will get help right away if you are not doing well or get worse.   This information is not intended to replace advice given to you by your health care  provider. Make sure you discuss any questions you have with your health care provider.   Document Released: 01/31/2005 Document Revised: 04/28/2013 Document Reviewed: 03/18/2013 Elsevier Interactive Patient Education [employer].

## 2015-10-23 NOTE — ED Notes (Addendum)
Patient complains of lower abdominal pain, pelvic pain, and rectal pain x 2 days with dysuria. Has noted some mucus/blood in stool. Nausea with same. Thinks may be related to recurrent prostate infection or possibly his IBS.

## 2015-10-27 ENCOUNTER — Other Ambulatory Visit: Payer: Self-pay | Admitting: Family Medicine

## 2015-12-21 ENCOUNTER — Other Ambulatory Visit: Payer: Self-pay | Admitting: Family Medicine

## 2016-01-07 ENCOUNTER — Emergency Department (HOSPITAL_COMMUNITY): Payer: Self-pay

## 2016-01-07 ENCOUNTER — Encounter (HOSPITAL_COMMUNITY): Payer: Self-pay | Admitting: *Deleted

## 2016-01-07 ENCOUNTER — Emergency Department (HOSPITAL_COMMUNITY)
Admission: EM | Admit: 2016-01-07 | Discharge: 2016-01-08 | Disposition: A | Discharge status: Home / Self Care | Payer: Self-pay | Attending: Emergency Medicine | Admitting: Emergency Medicine

## 2016-01-07 DIAGNOSIS — I10 Essential (primary) hypertension: Secondary | ICD-10-CM | POA: Insufficient documentation

## 2016-01-07 DIAGNOSIS — R0789 Other chest pain: Secondary | ICD-10-CM | POA: Insufficient documentation

## 2016-01-07 DIAGNOSIS — R202 Paresthesia of skin: Secondary | ICD-10-CM | POA: Insufficient documentation

## 2016-01-07 DIAGNOSIS — R209 Unspecified disturbances of skin sensation: Secondary | ICD-10-CM

## 2016-01-07 LAB — CBC
HCT: 43 % (ref 39.0–52.0)
HEMOGLOBIN: 14.9 g/dL (ref 13.0–17.0)
MCH: 28.7 pg (ref 26.0–34.0)
MCHC: 34.7 g/dL (ref 30.0–36.0)
MCV: 82.9 fL (ref 78.0–100.0)
PLATELETS: 244 10*3/uL (ref 150–400)
RBC: 5.19 MIL/uL (ref 4.22–5.81)
RDW: 12.4 % (ref 11.5–15.5)
WBC: 9.4 10*3/uL (ref 4.0–10.5)

## 2016-01-07 LAB — BASIC METABOLIC PANEL
ANION GAP: 7 (ref 5–15)
BUN: 17 mg/dL (ref 6–20)
CALCIUM: 9.1 mg/dL (ref 8.9–10.3)
CO2: 24 mmol/L (ref 22–32)
CREATININE: 1.18 mg/dL (ref 0.61–1.24)
Chloride: 106 mmol/L (ref 101–111)
Glucose, Bld: 117 mg/dL — ABNORMAL HIGH (ref 65–99)
Potassium: 3.7 mmol/L (ref 3.5–5.1)
Sodium: 137 mmol/L (ref 135–145)

## 2016-01-07 LAB — TROPONIN I

## 2016-01-07 NOTE — ED Triage Notes (Signed)
The pt is c/o lt upper chest pain numbness lt face headache 1-2 weeks he has anxiety attacks and he has taken xanax that has not helped.  His lt face numbness started today he cannot tell me what time  But he did not get up this am with the headacher  He just feels tired and he does not feel well

## 2016-01-07 NOTE — ED Notes (Signed)
Pt still in MRI 

## 2016-01-07 NOTE — ED Provider Notes (Signed)
[facility] DEPT Provider Note   CSN: [id number] Arrival date & time: 01/07/16  1921  By signing my name below, I, Adam [name], attest that this documentation has been prepared under the direction and in the presence of Ezequiel Essex, MD. Electronically Signed: Hansel [name], ED Scribe. 01/07/16. 9:34 PM.     History   Chief Complaint Chief Complaint  Patient presents with  . Chest Pain    HPI Adam [name] is a 50 y.o. male who presents to the Emergency Department complaining of moderate, subjective numbness to the left side of the face and neck onset sometime this morning before noon. He is unable to specify a specific time. Pt denies difficulty speaking, but feels as though he "does not want to". He reports similar symptoms with prior episodes of anxiety, but not as severe. Pt states these symptoms are normally alleviated with Xanax, but this has provided no relief today. He also complains of moderate, constant left-sided HA onset 2 weeks ago. He also notes some generalized chest tightness, which is waxing and waning at baseline and has not worsened today. He denies h/o stroke, DM. He denies worsened numbness or tingling to BLE from baseline. He also denies worsened abdominal pain from baseline, CP, dizziness, difficulty swallowing. Pt does not take ASA or Plavix.   The history is provided by the patient. No language interpreter was used.    Past Medical History:  Diagnosis Date  . Anxiety state, unspecified   . Esophageal reflux    no meds. per pt.  . Family history of stress   . Generalized headaches   . Hiatal hernia    pt. denies nerve/mucscle disease  . Hypertension   . IBS (irritable bowel syndrome)   . Major depressive disorder, single episode, unspecified ([place])   . Palpitations   . Panic disorder without agoraphobia   . Prostatitis, unspecified   . RBBB (right bundle branch block)   . Sleep apnea   . Unspecified cardiovascular disease   . Visual disturbance      Patient Active Problem List   Diagnosis Date Noted  . Lipoma of abdominal wall 03/17/2014  . Onychomycosis 01/18/2014  . Pain, dental 12/16/2013  . Cervical strain, acute 12/16/2013  . Pneumonia 11/12/2013  . URI (upper respiratory infection) 08/13/2011  . Cough 08/09/2011  . IBS (irritable bowel syndrome)- Constipation Predominant 06/19/2011  . Headache(784.0) 05/14/2011  . Light-headedness 05/14/2011  . Left ear pain 05/14/2011  . Inguinal hernia, bilateral 03/02/2011  . Bilateral inguinal hernia 02/26/2011  . Dental abscess 02/15/2011  . Shortness of breath 01/30/2011  . Fatigue 12/13/2010  . Abdominal pain 10/20/2010  . Shoulder pain, bilateral 07/31/2010  . Orthopnea 07/31/2010  . BACK PAIN, CHRONIC 06/09/2010  . UNSPECIFIED VISUAL DISTURBANCE 12/12/2009  . UNSPECIFIED CONDITION OF BRAIN 10/19/2009  . ALLERGIC RHINITIS, SEASONAL 08/25/2009  . HYPOKALEMIA, HX OF 12/30/2008  . Essential hypertension, benign 08/04/2007  . LOW BACK PAIN, CHRONIC 08/04/2007  . Mood disorder NOS 05/28/2007  . PALPITATIONS 04/25/2007  . CHEST PAIN 04/18/2007  . PROSTATITIS NOS 08/14/2006  . ANXIETY 07/04/2006  . PANIC ATTACKS 07/04/2006  . GASTROESOPHAGEAL REFLUX, NO ESOPHAGITIS 07/04/2006    Past Surgical History:  Procedure Laterality Date  . COLONOSCOPY  07/26/2005   normal  . ESOPHAGOGASTRODUODENOSCOPY  01/03/2005   normal  . FRACTURE SURGERY  2000   left arm  . HERNIA REPAIR  03/14/2011   BIH  . INGUINAL HERNIA REPAIR  03/14/2011   Procedure: LAPAROSCOPIC BILATERAL INGUINAL  HERNIA REPAIR;  Surgeon: Harl Bowie, MD;  Location: [facility] ORS;  Service: General;  Laterality: N/A;  with mesh  . pyloric stenosis     . right arm     fracture--Right arm fracture s/p ORIF from roller blading.  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  Authorizing Provider  ALPRAZolam (XANAX) 0.25 MG tablet Take 0.25 mg by mouth 3 (three) times daily as needed for anxiety.    Historical Provider, MD  ciprofloxacin (CIPRO) 500 MG tablet Take 1 tablet (500 mg total) by mouth 2 (two) times daily. 10/23/15   Kalman Drape, PA  docusate sodium (COLACE) 100 MG capsule Take 1 capsule (100 mg total) by mouth every 12 (twelve) hours. 10/23/15   Kalman Drape, PA  HYDROcodone-acetaminophen (NORCO/VICODIN) 5-325 MG tablet Take 2 tablets by mouth every 4 (four) hours as needed. 10/23/15   Kalman Drape, PA  metoprolol succinate (TOPROL-XL) 25 MG 24 hr tablet Take 25 mg by mouth daily.    Historical Provider, MD  metoprolol succinate (TOPROL-XL) 50 MG 24 hr tablet TAKE ONE TABLET BY MOUTH ONCE DAILY .  TAKE  WITH  OR  IMMEDIATELY  FOLLOWING  A  MEAL. 12/21/15   Archie Patten, MD  metroNIDAZOLE (FLAGYL) 500 MG tablet Take 1 tablet (500 mg total) by mouth 3 (three) times daily. 10/23/15   Kalman Drape, PA    Family History Family History  Problem Relation Age of Onset  . Heart attack Mother 12    PTCA  and 3 stents  . Heart disease Mother   . Hodgkin's lymphoma Maternal Grandmother   . Cancer Maternal Aunt     pt unaware of what kind  . Cancer Maternal Uncle     pt unaware of what kind  . Pyloric stenosis Daughter 20    X2  . Anxiety disorder    . Bipolar disorder Daughter     Social History Social History  Substance Use Topics  . Smoking status: Never Smoker  . Smokeless tobacco: Never Used  . Alcohol use No     Allergies   Hctz [hydrochlorothiazide] and Penicillins   Review of Systems Review of Systems A complete 10 system review of systems was obtained and all systems are negative except as noted in the HPI and PMH.    Physical Exam Updated Vital Signs There were no vitals taken for this visit.  Physical Exam  Constitutional: He is oriented to person, place, and time. He appears well-developed and well-nourished. No  distress.  HENT:  Head: Normocephalic and atraumatic.  Mouth/Throat: Oropharynx is clear and moist. No oropharyngeal exudate.  Eyes: Conjunctivae and EOM are normal. Pupils are equal, round, and reactive to light.  Neck: Normal range of motion. Neck supple.  No meningismus.  Cardiovascular: Normal rate, regular rhythm, normal heart sounds and intact distal pulses.   No murmur heard. Pulmonary/Chest: Effort normal and breath sounds normal. No respiratory distress.  Abdominal: Soft. There is no tenderness. There is no rebound and no guarding.  Musculoskeletal: Normal range of motion. He exhibits no edema or tenderness.  Neurological: He is alert and oriented to person, place, and time. No cranial nerve deficit. He exhibits normal muscle tone. Coordination normal.  Subjective numbness to left face and neck.   No ataxia on  finger to nose bilaterally. No pronator drift. 5/5 strength throughout. CN 2-12 intact.Equal grip strength. Sensation intact.   Skin: Skin is warm.  Psychiatric: He has a normal mood and affect. His behavior is normal.  Nursing note and vitals reviewed.    ED Treatments / Results   DIAGNOSTIC STUDIES: Oxygen Saturation is 98% on RA, normal by my interpretation.    COORDINATION OF CARE: 9:27 PM Discussed treatment plan with pt at bedside which includes CXR, lab work, MRI and pt agreed to plan.    Labs (all labs ordered are listed, but only abnormal results are displayed) Labs Reviewed  BASIC METABOLIC PANEL - Abnormal; Notable for the following:       Result Value   Glucose, Bld 117 (*)    All other components within normal limits  CBC  TROPONIN I    EKG  EKG Interpretation  Date/Time:  Saturday January 07 2016 19:28:56 EDT Ventricular Rate:  67 PR Interval:  162 QRS Duration: 84 QT Interval:  382 QTC Calculation: 403 R Axis:   39 Text Interpretation:  Normal sinus rhythm Normal ECG No significant change was found Confirmed by Wyvonnia Dusky  MD, Vermelle Cammarata  [phone]) on 01/07/2016 11:39:44 PM       Radiology Dg Chest 2 View  Result Date: 01/07/2016 CLINICAL DATA:  Chest pain, shortness breath, and facial numbness beginning earlier today. EXAM: CHEST  2 VIEW COMPARISON:  06/06/2015 FINDINGS: The heart size and mediastinal contours are within normal limits. Both lungs are clear. The visualized skeletal structures are unremarkable. IMPRESSION: No active cardiopulmonary disease. Electronically Signed   By: Earle Gell M.D.   On: 01/07/2016 20:16   Ct Head Wo Contrast  Result Date: 01/07/2016 CLINICAL DATA:  50 year old male with acute left facial numbness and headache for 1 hour. EXAM: CT HEAD WITHOUT CONTRAST TECHNIQUE: Contiguous axial images were obtained from the base of the skull through the vertex without intravenous contrast. COMPARISON:  12/14/2008 and prior CTs FINDINGS: Brain: No evidence of acute infarction, hemorrhage, hydrocephalus, extra-axial collection or mass lesion/mass effect. Remote left subinsular and high parietal infarcts again noted. Vascular: No hyperdense vessel or unexpected calcification. Skull: Unremarkable Sinuses/Orbits: Mild mucosal thickening within scattered ethmoid air cells and left frontal sinus noted. Other: None. IMPRESSION: No evidence of acute intracranial abnormality. Remote left subinsular and high parietal infarcts. Electronically Signed   By: Margarette Canada M.D.   On: 01/07/2016 21:08    Procedures Procedures (including critical care time)  Medications Ordered in ED Medications - No data to display   Initial Impression / Assessment and Plan / ED Course  I have reviewed the triage vital signs and the nursing notes.  Pertinent labs & imaging results that were available during my care of the patient were reviewed by me and considered in my medical decision making (see chart for details).  Clinical Course  Patient with left-sided facial and neck numbness onset sometime this morning. No difficulty speaking or  swallowing. No focal weakness in arms or legs. Code stroke not activated due to delay in presentation.  Patient states he has constant chest tightness which is unchanged. EKG is reassuring. Troponin is negative.  CT shows chronic-appearing infarcts. Given new numbness to his left face we'll obtain MRI.  MRI negative for acute infarct.  Patient states has had sensation to L face before with anxiety. Ongoing chest tightness "all the time" with negative troponin. Doubt ACS. EKG reassuring.  Followup with PCP and neurology. Return precautions discussed.   Final  Clinical Impressions(s) / ED Diagnoses   Final diagnoses:  Facial paresthesia    New Prescriptions New Prescriptions   No medications on file   I personally performed the services described in this documentation, which was scribed in my presence. The recorded information has been reviewed and is accurate.    Ezequiel Essex, MD 01/08/16 [phone]

## 2016-01-07 NOTE — Discharge Instructions (Signed)
There is no evidence of stroke. Follow up with the neurologist. Return to the ED if you develop new or worsening symptoms. °

## 2016-01-23 ENCOUNTER — Other Ambulatory Visit: Payer: Self-pay | Admitting: Family Medicine

## 2016-04-23 ENCOUNTER — Other Ambulatory Visit: Payer: Self-pay | Admitting: Family Medicine

## 2016-07-11 ENCOUNTER — Other Ambulatory Visit: Payer: Self-pay | Admitting: Family Medicine

## 2016-09-11 ENCOUNTER — Other Ambulatory Visit: Payer: Self-pay | Admitting: Family Medicine

## 2016-09-11 NOTE — Telephone Encounter (Signed)
Refilled Toprol x 1 month supply, pls have pt come in for a BP appt.  Thanks, Archie Patten, MD [facility] Family Medicine Resident  09/11/2016, 11:21 AM

## 2016-09-12 NOTE — Telephone Encounter (Signed)
Called patient, no answer, no voicemail. Will try again later

## 2016-11-13 ENCOUNTER — Emergency Department
Admission: EM | Admit: 2016-11-13 | Discharge: 2016-11-13 | Disposition: A | Discharge status: Home / Self Care | Payer: Self-pay | Attending: Emergency Medicine | Admitting: Emergency Medicine

## 2016-11-13 ENCOUNTER — Emergency Department: Payer: Self-pay

## 2016-11-13 DIAGNOSIS — Z79899 Other long term (current) drug therapy: Secondary | ICD-10-CM | POA: Insufficient documentation

## 2016-11-13 DIAGNOSIS — Y998 Other external cause status: Secondary | ICD-10-CM | POA: Insufficient documentation

## 2016-11-13 DIAGNOSIS — Y9383 Activity, rough housing and horseplay: Secondary | ICD-10-CM | POA: Insufficient documentation

## 2016-11-13 DIAGNOSIS — S2241XA Multiple fractures of ribs, right side, initial encounter for closed fracture: Secondary | ICD-10-CM | POA: Insufficient documentation

## 2016-11-13 DIAGNOSIS — I10 Essential (primary) hypertension: Secondary | ICD-10-CM | POA: Insufficient documentation

## 2016-11-13 DIAGNOSIS — Y929 Unspecified place or not applicable: Secondary | ICD-10-CM | POA: Insufficient documentation

## 2016-11-13 DIAGNOSIS — W51XXXA Accidental striking against or bumped into by another person, initial encounter: Secondary | ICD-10-CM | POA: Insufficient documentation

## 2016-11-13 MED ORDER — HYDROCODONE-ACETAMINOPHEN 5-325 MG PO TABS: 1.0000 | ORAL_TABLET | ORAL | 0 refills | Status: DC | PRN

## 2016-11-13 MED ORDER — IBUPROFEN 600 MG PO TABS: 600.0000 mg | ORAL_TABLET | Freq: Three times a day (TID) | ORAL | 0 refills | Status: DC | PRN

## 2016-11-13 NOTE — Discharge Instructions (Signed)
Follow-up with [facility] clinic acute-care if any continued problems. Ibuprofen as needed for inflammation and pain. Norco as needed for severe pain. Do not drive or operate machinery while taking this medication.

## 2016-11-13 NOTE — ED Notes (Signed)
See triage note. Having pain to right rib area after horse playing last pm

## 2016-11-13 NOTE — ED Provider Notes (Signed)
[facility] Emergency Department Provider Note  ____________________________________________   First MD Initiated Contact with Patient 11/13/16 1216     (approximate)  I have reviewed the triage vital signs and the nursing notes.   HISTORY  Chief Complaint Rib Injury    HPI Adam [name] is a 51 y.o. male is here with complaint of right rib pain. Patient states that he was horsing around last evening with his 2 teenage sons showing them wrestling moves. Patient states that he collided with one of his sons knees and felt a popping sensation immediately. Patient states the pain was worse this morning when he first woke up. He has taken Tylenol without any relief of his pain. Patient is a nonsmoker and denies any previous injury to his ribs. Currently rates his pain as 10 over 10.   Past Medical History:  Diagnosis Date  . Anxiety state, unspecified   . Esophageal reflux    no meds. per pt.  . Family history of stress   . Generalized headaches   . Hiatal hernia    pt. denies nerve/mucscle disease  . Hypertension   . IBS (irritable bowel syndrome)   . Major depressive disorder, single episode, unspecified   . Palpitations   . Panic disorder without agoraphobia   . Prostatitis, unspecified   . RBBB (right bundle branch block)   . Sleep apnea   . Unspecified cardiovascular disease   . Visual disturbance     Patient Active Problem List   Diagnosis Date Noted  . Lipoma of abdominal wall 03/17/2014  . Onychomycosis 01/18/2014  . Pain, dental 12/16/2013  . Cervical strain, acute 12/16/2013  . Pneumonia 11/12/2013  . URI (upper respiratory infection) 08/13/2011  . Cough 08/09/2011  . IBS (irritable bowel syndrome)- Constipation Predominant 06/19/2011  . Headache(784.0) 05/14/2011  . Light-headedness 05/14/2011  . Left ear pain 05/14/2011  . Inguinal hernia, bilateral 03/02/2011  . Bilateral inguinal hernia 02/26/2011  . Dental abscess  02/15/2011  . Shortness of breath 01/30/2011  . Fatigue 12/13/2010  . Abdominal pain 10/20/2010  . Shoulder pain, bilateral 07/31/2010  . Orthopnea 07/31/2010  . BACK PAIN, CHRONIC 06/09/2010  . UNSPECIFIED VISUAL DISTURBANCE 12/12/2009  . UNSPECIFIED CONDITION OF BRAIN 10/19/2009  . ALLERGIC RHINITIS, SEASONAL 08/25/2009  . HYPOKALEMIA, HX OF 12/30/2008  . Essential hypertension, benign 08/04/2007  . LOW BACK PAIN, CHRONIC 08/04/2007  . Mood disorder NOS 05/28/2007  . PALPITATIONS 04/25/2007  . CHEST PAIN 04/18/2007  . PROSTATITIS NOS 08/14/2006  . ANXIETY 07/04/2006  . PANIC ATTACKS 07/04/2006  . GASTROESOPHAGEAL REFLUX, NO ESOPHAGITIS 07/04/2006    Past Surgical History:  Procedure Laterality Date  . COLONOSCOPY  07/26/2005   normal  . ESOPHAGOGASTRODUODENOSCOPY  01/03/2005   normal  . FRACTURE SURGERY  2000   left arm  . HERNIA REPAIR  03/14/2011   BIH  . INGUINAL HERNIA REPAIR  03/14/2011   Procedure: LAPAROSCOPIC BILATERAL INGUINAL HERNIA REPAIR;  Surgeon: Harl Bowie, MD;  Location: [facility];  Service: General;  Laterality: N/A;  with mesh  . pyloric stenosis     . right arm     fracture--Right arm fracture s/p ORIF from roller blading.  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 Authorizing Provider  ALPRAZolam Duanne Moron) 1 MG tablet Take 1 mg by mouth See admin instructions. Take 1 tablet (  1 mg) by mouth twice daily - mid afternoon and bedtime    [provider]  HYDROcodone-acetaminophen (NORCO/VICODIN) 5-325 MG tablet Take 1 tablet by mouth every 4 (four) hours as needed for moderate pain. 11/13/16   Johnn Hai, PA-C  ibuprofen (ADVIL,MOTRIN) 600 MG tablet Take 1 tablet (600 mg total) by mouth every 8 (eight) hours as needed. 11/13/16   Johnn Hai, PA-C  metoprolol succinate (TOPROL-XL) 50 MG 24 hr tablet TAKE 1 TABLET BY MOUTH  ONCE DAILY. TAKE  WITH  OR  IMMEDIATELY  FOLLOWING  A  MEAL 09/11/16   Archie Patten, MD    Allergies Hctz [hydrochlorothiazide] and Penicillins  Family History  Problem Relation Age of Onset  . Heart attack Mother 48       PTCA  and 3 stents  . Heart disease Mother   . Hodgkin's lymphoma Maternal Grandmother   . Cancer Maternal Aunt        pt unaware of what kind  . Cancer Maternal Uncle        pt unaware of what kind  . Pyloric stenosis Daughter 44       X2  . Anxiety disorder Unknown   . Bipolar disorder Daughter     Social History Social History  Substance Use Topics  . Smoking status: Never Smoker  . Smokeless tobacco: Never Used  . Alcohol use No    Review of Systems Constitutional: No fever/chills Eyes: No visual changes. ENT: No trauma Cardiovascular: Denies chest pain. Respiratory: Denies shortness of breath. Gastrointestinal: No abdominal pain.  No nausea, no vomiting.   Genitourinary: Negative for dysuria. Musculoskeletal: Positive for right rib pain. Skin: Negative for rash. Neurological: Negative for headaches   ____________________________________________   PHYSICAL EXAM:  VITAL SIGNS: ED Triage Vitals  Enc Vitals Group     BP 11/13/16 1205 (!) 147/93     Pulse Rate 11/13/16 1205 70     Resp 11/13/16 1205 17     Temp 11/13/16 1205 99.2 F (37.3 C)     Temp Source 11/13/16 1205 Oral     SpO2 11/13/16 1205 98 %     Weight 11/13/16 1206 160 lb (72.6 kg)     Height 11/13/16 1206 5\' 8"  (1.727 m)     Head Circumference --      Peak Flow --      Pain Score 11/13/16 1205 10     Pain Loc --      Pain Edu? --      Excl. in [place]? --     Constitutional: Alert and oriented. Well appearing and in no acute distress. Eyes: Conjunctivae are normal. PERRL. EOMI. Head: Atraumatic. Nose: No congestion/rhinnorhea. Neck: No stridor.   Cardiovascular: Normal rate, regular rhythm. Grossly normal heart sounds.  Good peripheral circulation. Respiratory:  Normal respiratory effort.  No retractions. Lungs CTAB.  On examination of the right lateral ribs there is no gross deformity or ecchymosis noted. There is minimal soft tissue edema present. There is marked tenderness on palpation of the middle to lower ribs. Gastrointestinal: Soft and nontender. No distention.  Musculoskeletal: Moves upper and lower extremities without any difficulty. Normal gait was noted. Neurologic:  Normal speech and language. No gross focal neurologic deficits are appreciated. No gait instability. Skin:  Skin is warm, dry and intact. No ecchymosis or abrasions were noted. Psychiatric: Mood and affect are normal. Speech and behavior are normal.  ____________________________________________   LABS (all labs ordered are listed, but  only abnormal results are displayed)  Labs Reviewed - No data to display  RADIOLOGY  Dg Ribs Unilateral W/chest Right  Result Date: 11/13/2016 CLINICAL DATA:  51 year old male playing and fell on right-side an felt pain. Initial encounter. EXAM: RIGHT RIBS AND CHEST - 3+ VIEW COMPARISON:  01/07/2016 chest x-ray. FINDINGS: Right seventh and eighth anterolateral rib fracture with minimal displacement. No pneumothorax. No infiltrate or pulmonary edema. Heart size within normal limits. No plain film evidence of pulmonary malignancy. Minimal acromioclavicular joint degenerative changes. IMPRESSION: Right seventh and eighth anterolateral rib fracture with minimal displacement without pneumothorax. Electronically Signed   By: Genia Del M.D.   On: 11/13/2016 12:55    ____________________________________________   PROCEDURES  Procedure(s) performed: None  Procedures  Critical Care performed: No  ____________________________________________   INITIAL IMPRESSION / ASSESSMENT AND PLAN / ED COURSE  Pertinent labs & imaging results that were available during my care of the patient were reviewed by me and considered in my medical decision  making (see chart for details).  Patient was given prescription for ibuprofen 600 mg every 8 hours as needed for pain. He is also given a prescription for Norco as needed for severe pain however patient states that he normally does not take anything stronger than Tylenol or ibuprofen. We also discussed using pillows should he need to cough or sneeze. 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 He may use ice to the area as needed for pain as well. He is aware that he cannot take narcotics while working or driving.      ____________________________________________   FINAL CLINICAL IMPRESSION(S) / ED DIAGNOSES  Final diagnoses:  Multiple fractures of ribs, right side, initial encounter for closed fracture      NEW MEDICATIONS STARTED DURING THIS VISIT:  Discharge Medication List as of 11/13/2016  1:09 PM    START taking these medications   Details  HYDROcodone-acetaminophen (NORCO/VICODIN) 5-325 MG tablet Take 1 tablet by mouth every 4 (four) hours as needed for moderate pain., Starting Tue 11/13/2016, Print    ibuprofen (ADVIL,MOTRIN) 600 MG tablet Take 1 tablet (600 mg total) by mouth every 8 (eight) hours as needed., Starting Tue 11/13/2016, Print         Note:  This document was prepared using Dragon voice recognition software and may include unintentional dictation errors.    Johnn Hai, PA-C 11/13/16 1351    Schaevitz, Randall An, MD 11/13/16 1425

## 2016-11-13 NOTE — ED Triage Notes (Signed)
Pt states he was horse playing with his teenage sons last night and injured his right ribs and states it hurts to breath.

## 2016-12-07 ENCOUNTER — Other Ambulatory Visit: Payer: Self-pay | Admitting: *Deleted

## 2018-02-04 ENCOUNTER — Telehealth: Payer: Self-pay | Admitting: Student in an Organized Health Care Education/Training Program

## 2018-02-04 NOTE — Telephone Encounter (Signed)
Contacted pt for health maintenance appointment. Declined due to no insurance. [place]

## 2018-03-17 ENCOUNTER — Encounter (HOSPITAL_COMMUNITY): Payer: Self-pay | Admitting: *Deleted

## 2018-03-17 ENCOUNTER — Emergency Department (HOSPITAL_COMMUNITY)
Admission: EM | Admit: 2018-03-17 | Discharge: 2018-03-17 | Disposition: A | Discharge status: Home / Self Care | Payer: Self-pay | Attending: Emergency Medicine | Admitting: Emergency Medicine

## 2018-03-17 ENCOUNTER — Emergency Department (HOSPITAL_COMMUNITY): Payer: Self-pay

## 2018-03-17 ENCOUNTER — Other Ambulatory Visit: Payer: Self-pay

## 2018-03-17 DIAGNOSIS — Z79899 Other long term (current) drug therapy: Secondary | ICD-10-CM | POA: Insufficient documentation

## 2018-03-17 DIAGNOSIS — I10 Essential (primary) hypertension: Secondary | ICD-10-CM | POA: Insufficient documentation

## 2018-03-17 DIAGNOSIS — R079 Chest pain, unspecified: Secondary | ICD-10-CM | POA: Insufficient documentation

## 2018-03-17 LAB — BASIC METABOLIC PANEL
Anion gap: 5 (ref 5–15)
BUN: 10 mg/dL (ref 6–20)
CHLORIDE: 106 mmol/L (ref 98–111)
CO2: 29 mmol/L (ref 22–32)
Calcium: 9.3 mg/dL (ref 8.9–10.3)
Creatinine, Ser: 1.22 mg/dL (ref 0.61–1.24)
GFR calc Af Amer: >60 mL/min (ref >60)
GLUCOSE: 135 mg/dL — AB (ref 70–99)
POTASSIUM: 3.9 mmol/L (ref 3.5–5.1)
Sodium: 140 mmol/L (ref 135–145)

## 2018-03-17 LAB — CBC
HEMATOCRIT: 48.9 % (ref 39.0–52.0)
HEMOGLOBIN: 15.8 g/dL (ref 13.0–17.0)
MCH: 28 pg (ref 26.0–34.0)
MCHC: 32.3 g/dL (ref 30.0–36.0)
MCV: 86.5 fL (ref 80.0–100.0)
Platelets: 245 10*3/uL (ref 150–400)
RBC: 5.65 MIL/uL (ref 4.22–5.81)
RDW: 11.9 % (ref 11.5–15.5)
WBC: 7.4 10*3/uL (ref 4.0–10.5)
nRBC: 0.3 % — ABNORMAL HIGH (ref 0.0–0.2)

## 2018-03-17 LAB — I-STAT TROPONIN, ED
TROPONIN I, POC: 0 ng/mL (ref 0.00–0.08)
Troponin i, poc: 0 ng/mL (ref 0.00–0.08)

## 2018-03-17 MED ORDER — ASPIRIN 81 MG PO CHEW
324.0000 mg | CHEWABLE_TABLET | Freq: Once | ORAL | Status: AC
  Administered 2018-03-17: 324 mg via ORAL
  Filled 2018-03-17: qty 4

## 2018-03-17 MED ORDER — ALBUTEROL SULFATE HFA 108 (90 BASE) MCG/ACT IN AERS: 1.0000 | INHALATION_SPRAY | Freq: Four times a day (QID) | RESPIRATORY_TRACT | 0 refills | Status: DC | PRN

## 2018-03-17 MED ORDER — METOPROLOL SUCCINATE ER 50 MG PO TB24: 50.0000 mg | ORAL_TABLET | Freq: Every day | ORAL | 0 refills | Status: DC

## 2018-03-17 NOTE — Discharge Instructions (Addendum)
Take your blood pressure medication as prescribed. Follow-up with primary care provider, referral given if needed.  Return to ER for severe or concerning symptoms.

## 2018-03-17 NOTE — ED Notes (Signed)
Patient transported to X-ray 

## 2018-03-17 NOTE — ED Triage Notes (Signed)
Pt reports waking up with CP and reports having a previous Hx of MI. Pt currently out of BP meds. Pt does not have a a MD at this time. Pt also reports a Hx of RBB.

## 2018-03-17 NOTE — ED Provider Notes (Signed)
[name] EMERGENCY DEPARTMENT Provider Note   CSN: [id number] Arrival date & time: 03/17/18  0086     History   Chief Complaint Chief Complaint  Patient presents with  . Chest Pain    HPI Adam [name] is a 52 y.o. male.  52 year old male presents with complaint of chest pain onset 6AM today, woke him from his sleep, described as "pressure from the inside out," resolved after taking Xanax. Patient states he woke up with pain this morning and checked his blood pressure, his blood pressure was elevated which prompted to come to the emergency room today.  Patient states that he takes medication for high blood pressure however has been out of his medication for the past week, states his PCP is no longer practicing and does not have a primary care to prescribe his medication.  He denies any associated shortness of breath, diaphoresis, nausea, vomiting.  Denies leg swelling.  Patient reports history of chronic lung disease, unsure if this is asthma, denies history of current or previous tobacco use.  Patient was told at one point that he had had a "silent heart attack," based on a previous EKG, no known history of high cholesterol or diabetes.  Family history significant for mother had a heart attack in her 32s, states she has a history of some sort of cardiomyopathy.  Does not know his father's history.  No other complaints or concerns.     Past Medical History:  Diagnosis Date  . Anxiety state, unspecified   . Esophageal reflux    no meds. per pt.  . Family history of stress   . Generalized headaches   . Hiatal hernia    pt. denies nerve/mucscle disease  . Hypertension   . IBS (irritable bowel syndrome)   . Major depressive disorder, single episode, unspecified   . Palpitations   . Panic disorder without agoraphobia   . Prostatitis, unspecified   . RBBB (right bundle branch block)   . Sleep apnea   . Unspecified cardiovascular disease   . Visual  disturbance     Patient Active Problem List   Diagnosis Date Noted  . Lipoma of abdominal wall 03/17/2014  . Onychomycosis 01/18/2014  . Pain, dental 12/16/2013  . Cervical strain, acute 12/16/2013  . Pneumonia 11/12/2013  . URI (upper respiratory infection) 08/13/2011  . Cough 08/09/2011  . IBS (irritable bowel syndrome)- Constipation Predominant 06/19/2011  . Headache(784.0) 05/14/2011  . Light-headedness 05/14/2011  . Left ear pain 05/14/2011  . Inguinal hernia, bilateral 03/02/2011  . Bilateral inguinal hernia 02/26/2011  . Dental abscess 02/15/2011  . Shortness of breath 01/30/2011  . Fatigue 12/13/2010  . Abdominal pain 10/20/2010  . Shoulder pain, bilateral 07/31/2010  . Orthopnea 07/31/2010  . BACK PAIN, CHRONIC 06/09/2010  . UNSPECIFIED VISUAL DISTURBANCE 12/12/2009  . UNSPECIFIED CONDITION OF BRAIN 10/19/2009  . ALLERGIC RHINITIS, SEASONAL 08/25/2009  . HYPOKALEMIA, HX OF 12/30/2008  . Essential hypertension, benign 08/04/2007  . LOW BACK PAIN, CHRONIC 08/04/2007  . Mood disorder NOS 05/28/2007  . PALPITATIONS 04/25/2007  . CHEST PAIN 04/18/2007  . PROSTATITIS NOS 08/14/2006  . ANXIETY 07/04/2006  . PANIC ATTACKS 07/04/2006  . GASTROESOPHAGEAL REFLUX, NO ESOPHAGITIS 07/04/2006    Past Surgical History:  Procedure Laterality Date  . COLONOSCOPY  07/26/2005   normal  . ESOPHAGOGASTRODUODENOSCOPY  01/03/2005   normal  . FRACTURE SURGERY  2000   left arm  . HERNIA REPAIR  03/14/2011   BIH  . INGUINAL HERNIA  REPAIR  03/14/2011   Procedure: LAPAROSCOPIC BILATERAL INGUINAL HERNIA REPAIR;  Surgeon: Harl Bowie, MD;  Location: [facility];  Service: General;  Laterality: N/A;  with mesh  . pyloric stenosis     . right arm     fracture--Right arm fracture s/p ORIF from roller blading.  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 Authorizing Provider  ALPRAZolam Duanne Moron) 1 MG tablet Take 1 mg by mouth as needed for anxiety. Take 1 tablet (1 mg) by mouth twice daily - mid afternoon and bedtime    Yes [provider]  albuterol (PROVENTIL HFA;VENTOLIN HFA) 108 (90 Base) MCG/ACT inhaler Inhale 1-2 puffs into the lungs every 6 (six) hours as needed for wheezing or shortness of breath. 03/17/18   Tacy Learn, PA-C  metoprolol succinate (TOPROL-XL) 50 MG 24 hr tablet Take 1 tablet (50 mg total) by mouth daily. Take with or immediately following a meal. 03/17/18 04/16/18  Tacy Learn, PA-C    Family History Family History  Problem Relation Age of Onset  . Heart attack Mother 85       PTCA  and 3 stents  . Heart disease Mother   . Hodgkin's lymphoma Maternal Grandmother   . Cancer Maternal Aunt        pt unaware of what kind  . Cancer Maternal Uncle        pt unaware of what kind  . Pyloric stenosis Daughter 68       X2  . Anxiety disorder Unknown   . Bipolar disorder Daughter     Social History Social History   Tobacco Use  . Smoking status: Never Smoker  . Smokeless tobacco: Never Used  Substance Use Topics  . Alcohol use: No  . Drug use: No     Allergies   Hctz [hydrochlorothiazide] and Penicillins   Review of Systems Review of Systems  Constitutional: Negative for chills, diaphoresis and fever.  Eyes: Negative for visual disturbance.  Respiratory: Negative for shortness of breath.   Cardiovascular: Positive for chest pain.  Gastrointestinal: Negative for nausea and vomiting.  Musculoskeletal: Negative for back pain.  Skin: Negative for rash and wound.  Allergic/Immunologic: Negative for immunocompromised state.  Neurological: Negative for weakness, numbness and headaches.  Hematological: Does not bruise/bleed easily.  Psychiatric/Behavioral: Negative for confusion.  All other systems reviewed and are negative.    Physical Exam Updated Vital Signs BP (!) 146/95  (BP Location: Left Arm)   Pulse 70   Temp 98.2 F (36.8 C) (Oral)   Resp 14   Ht 5\' 6"  (1.676 m)   Wt 70.3 kg   SpO2 100%   BMI 25.02 kg/m   Physical Exam  Constitutional: He is oriented to person, place, and time. He appears well-developed and well-nourished. No distress.  HENT:  Head: Normocephalic and atraumatic.  Neck: Neck supple.  Cardiovascular: Normal rate, regular rhythm, intact distal pulses and normal pulses.  Pulmonary/Chest: Effort normal and breath sounds normal. He has no decreased breath sounds.  Abdominal: Soft. There is no tenderness.  Musculoskeletal: Normal range of motion.       Right lower leg: Normal. He exhibits no tenderness and no edema.       Left lower leg: Normal. He exhibits no tenderness and no edema.  Neurological: He  is alert and oriented to person, place, and time.  Skin: Skin is warm and dry. He is not diaphoretic.  Psychiatric: He has a normal mood and affect. His behavior is normal.  Nursing note and vitals reviewed.    ED Treatments / Results  Labs (all labs ordered are listed, but only abnormal results are displayed) Labs Reviewed  BASIC METABOLIC PANEL - Abnormal; Notable for the following components:      Result Value   Glucose, Bld 135 (*)    All other components within normal limits  CBC - Abnormal; Notable for the following components:   nRBC 0.3 (*)    All other components within normal limits  I-STAT TROPONIN, ED  I-STAT TROPONIN, ED    EKG EKG Interpretation  Date/Time:  Monday March 17 2018 10:44:05 EST Ventricular Rate:  68 PR Interval:  174 QRS Duration: 88 QT Interval:  408 QTC Calculation: 433 R Axis:   35 Text Interpretation:  Normal sinus rhythm Normal ECG Confirmed by Quintella Reichert [id number]) on 03/17/2018 10:47:40 AM   Radiology Dg Chest 2 View  Result Date: 03/17/2018 CLINICAL DATA:  Awoke with chest pain and elevated blood pressure, hypertension medications for 1 week EXAM: CHEST - 2 VIEW  COMPARISON:  11/13/2016 FINDINGS: Normal heart size, mediastinal contours, and pulmonary vascularity. Lungs clear. No pulmonary infiltrate, pleural effusion or pneumothorax. Minimal chronic central peribronchial thickening. Bones unremarkable. IMPRESSION: Minimal chronic bronchitic changes without infiltrate. Electronically Signed   By: Lavonia Dana M.D.   On: 03/17/2018 09:57    Procedures Procedures (including critical care time)  Medications Ordered in ED Medications  aspirin chewable tablet 324 mg (324 mg Oral Given 03/17/18 1004)     Initial Impression / Assessment and Plan / ED Course  I have reviewed the triage vital signs and the nursing notes.  Pertinent labs & imaging results that were available during my care of the patient were reviewed by me and considered in my medical decision making (see chart for details).  Clinical Course as of Mar 17 1346  Mon Mar 17, 8765  [id number] 52 year old male presents with complaint of chest pain with elevated blood pressure today.  Pain had resolved prior to arrival in the ER after taking his Xanax. Exam unremarkable, EKG x 2 negative for ischemic changes, troponin x 2 negative, CBC/CMP unremarkable. Heart score 3. Patient was seen by Dr. Ralene Bathe, ER attending, agrees with plan- dc home, [facility], refill albuterol and Metoprolol. Discussed results and plan of care with patient, remains pain free throughout his ER stay, agrees with plan of care.   [LM]    Clinical Course User Index [LM] Tacy Learn, PA-C   Final Clinical Impressions(s) / ED Diagnoses   Final diagnoses:  Chest pain, unspecified type    ED Discharge Orders         Ordered    metoprolol succinate (TOPROL-XL) 50 MG 24 hr tablet  Daily    Note to Pharmacy:  Please consider 90 day supplies to promote better adherence   03/17/18 1334    albuterol (PROVENTIL HFA;VENTOLIN HFA) 108 (90 Base) MCG/ACT inhaler  Every 6 hours PRN     03/17/18 1341             Tacy Learn, PA-C 03/17/18 1347    Quintella Reichert, MD 03/18/18 [phone]

## 2018-06-02 ENCOUNTER — Encounter (HOSPITAL_COMMUNITY): Payer: Self-pay

## 2018-06-02 ENCOUNTER — Emergency Department (HOSPITAL_COMMUNITY)
Admission: EM | Admit: 2018-06-02 | Discharge: 2018-06-02 | Disposition: A | Discharge status: Home / Self Care | Payer: Self-pay | Attending: Emergency Medicine | Admitting: Emergency Medicine

## 2018-06-02 DIAGNOSIS — Z760 Encounter for issue of repeat prescription: Secondary | ICD-10-CM

## 2018-06-02 DIAGNOSIS — I10 Essential (primary) hypertension: Secondary | ICD-10-CM | POA: Insufficient documentation

## 2018-06-02 MED ORDER — METOPROLOL SUCCINATE ER 50 MG PO TB24: 50.0000 mg | ORAL_TABLET | Freq: Every day | ORAL | 0 refills | Status: DC

## 2018-06-02 NOTE — ED Provider Notes (Signed)
[facility] EMERGENCY DEPARTMENT Provider Note   CSN: [id number] Arrival date & time: 06/02/18  1343     History   Chief Complaint Chief Complaint  Patient presents with  . Medication Refill    HPI Adam [name] is a 53 y.o. male.  Adam [name] is a 53 y.o. male with a history of hypertension, RBBB, reflux, hiatal hernia, sleep apnea IBS and depression, who presents to the emergency department for medication refill.  He reports that he has an appointment to establish care with a new primary care doctor after his recently retired but they cannot see him until April and he has run out of his metoprolol XL 50 mg which he takes daily.  He reports he has been out of the medication for a week and can tell that his blood pressure is been higher than usual.  He monitors it at home and was 160/102 yesterday.  He denies any associated chest pain or shortness of breath, no lower extremity swelling.  Has had some occasional headaches but denies any headache currently, no vision changes, dizziness, no numbness, tingling or weakness in any extremities.  Denies any other acute complaints.     Past Medical History:  Diagnosis Date  . Anxiety state, unspecified   . Esophageal reflux    no meds. per pt.  . Family history of stress   . Generalized headaches   . Hiatal hernia    pt. denies nerve/mucscle disease  . Hypertension   . IBS (irritable bowel syndrome)   . Major depressive disorder, single episode, unspecified   . Palpitations   . Panic disorder without agoraphobia   . Prostatitis, unspecified   . RBBB (right bundle branch block)   . Sleep apnea   . Unspecified cardiovascular disease   . Visual disturbance     Patient Active Problem List   Diagnosis Date Noted  . Lipoma of abdominal wall 03/17/2014  . Onychomycosis 01/18/2014  . Pain, dental 12/16/2013  . Cervical strain, acute 12/16/2013  . Pneumonia 11/12/2013  . URI (upper respiratory  infection) 08/13/2011  . Cough 08/09/2011  . IBS (irritable bowel syndrome)- Constipation Predominant 06/19/2011  . Headache(784.0) 05/14/2011  . Light-headedness 05/14/2011  . Left ear pain 05/14/2011  . Inguinal hernia, bilateral 03/02/2011  . Bilateral inguinal hernia 02/26/2011  . Dental abscess 02/15/2011  . Shortness of breath 01/30/2011  . Fatigue 12/13/2010  . Abdominal pain 10/20/2010  . Shoulder pain, bilateral 07/31/2010  . Orthopnea 07/31/2010  . BACK PAIN, CHRONIC 06/09/2010  . UNSPECIFIED VISUAL DISTURBANCE 12/12/2009  . UNSPECIFIED CONDITION OF BRAIN 10/19/2009  . ALLERGIC RHINITIS, SEASONAL 08/25/2009  . HYPOKALEMIA, HX OF 12/30/2008  . Essential hypertension, benign 08/04/2007  . LOW BACK PAIN, CHRONIC 08/04/2007  . Mood disorder NOS 05/28/2007  . PALPITATIONS 04/25/2007  . CHEST PAIN 04/18/2007  . PROSTATITIS NOS 08/14/2006  . ANXIETY 07/04/2006  . PANIC ATTACKS 07/04/2006  . GASTROESOPHAGEAL REFLUX, NO ESOPHAGITIS 07/04/2006    Past Surgical History:  Procedure Laterality Date  . COLONOSCOPY  07/26/2005   normal  . ESOPHAGOGASTRODUODENOSCOPY  01/03/2005   normal  . FRACTURE SURGERY  2000   left arm  . HERNIA REPAIR  03/14/2011   BIH  . INGUINAL HERNIA REPAIR  03/14/2011   Procedure: LAPAROSCOPIC BILATERAL INGUINAL HERNIA REPAIR;  Surgeon: Harl Bowie, MD;  Location: [facility];  Service: General;  Laterality: N/A;  with mesh  . pyloric stenosis     . right arm  fracture--Right arm fracture s/p ORIF from roller blading.  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 Authorizing Provider  albuterol (PROVENTIL HFA;VENTOLIN HFA) 108 (90 Base) MCG/ACT inhaler Inhale 1-2 puffs into the lungs every 6 (six) hours as needed for wheezing or shortness of breath. 03/17/18   Tacy Learn, PA-C  ALPRAZolam Duanne Moron) 1  MG tablet Take 1 mg by mouth as needed for anxiety. Take 1 tablet (1 mg) by mouth twice daily - mid afternoon and bedtime     [provider]  metoprolol succinate (TOPROL-XL) 50 MG 24 hr tablet Take 1 tablet (50 mg total) by mouth daily. Take with or immediately following a meal. 06/02/18 08/31/18  Jacqlyn Larsen, PA-C    Family History Family History  Problem Relation Age of Onset  . Heart attack Mother 54       PTCA  and 3 stents  . Heart disease Mother   . Hodgkin's lymphoma Maternal Grandmother   . Cancer Maternal Aunt        pt unaware of what kind  . Cancer Maternal Uncle        pt unaware of what kind  . Pyloric stenosis Daughter 40       X2  . Anxiety disorder Other   . Bipolar disorder Daughter     Social History Social History   Tobacco Use  . Smoking status: Never Smoker  . Smokeless tobacco: Never Used  Substance Use Topics  . Alcohol use: No  . Drug use: No     Allergies   Hctz [hydrochlorothiazide] and Penicillins   Review of Systems Review of Systems  Constitutional: Negative for chills and fever.  HENT: Negative.   Eyes: Negative for visual disturbance.  Respiratory: Negative for cough and shortness of breath.   Cardiovascular: Negative for chest pain and leg swelling.  Gastrointestinal: Negative for abdominal pain, nausea and vomiting.  Musculoskeletal: Negative for arthralgias and myalgias.  Skin: Negative for color change and rash.  Neurological: Positive for headaches. Negative for dizziness, syncope, facial asymmetry, weakness, light-headedness and numbness.     Physical Exam Updated Vital Signs BP (!) 161/89 (BP Location: Right Arm)   Pulse 61   Temp 98.3 F (36.8 C) (Oral)   Resp 16   SpO2 100%   Physical Exam Vitals signs and nursing note reviewed.  Constitutional:      General: He is not in acute distress.    Appearance: Normal appearance. He is well-developed. He is not diaphoretic.  HENT:     Head: Normocephalic and  atraumatic.     Mouth/Throat:     Mouth: Mucous membranes are moist.     Pharynx: Oropharynx is clear.  Eyes:     General:        Right eye: No discharge.        Left eye: No discharge.     Extraocular Movements: Extraocular movements intact.     Pupils: Pupils are equal, round, and reactive to light.  Neck:     Musculoskeletal: Neck supple.  Cardiovascular:     Rate and Rhythm: Normal rate and regular rhythm.     Heart sounds: Normal heart sounds.  Pulmonary:     Effort: Pulmonary effort is normal. No respiratory distress.     Breath sounds: Normal breath sounds.  No wheezing or rales.     Comments: Respirations equal and unlabored, patient able to speak in full sentences, lungs clear to auscultation bilaterally Abdominal:     General: Bowel sounds are normal. There is no distension.     Palpations: Abdomen is soft. There is no mass.     Tenderness: There is no abdominal tenderness. There is no guarding.     Comments: Abdomen soft, nondistended, nontender to palpation in all quadrants without guarding or peritoneal signs  Musculoskeletal:        General: No deformity.  Skin:    General: Skin is warm and dry.     Capillary Refill: Capillary refill takes less than 2 seconds.  Neurological:     Mental Status: He is alert and oriented to person, place, and time. Mental status is at baseline.     Coordination: Coordination normal.     Comments: Speech is clear, able to follow commands CN III-XII intact Normal strength in upper and lower extremities bilaterally including dorsiflexion and plantar flexion, strong and equal grip strength Sensation normal to light and sharp touch Moves extremities without ataxia, coordination intact   Psychiatric:        Mood and Affect: Mood normal.        Behavior: Behavior normal.      ED Treatments / Results  Labs (all labs ordered are listed, but only abnormal results are displayed) Labs Reviewed - No data to  display  EKG None  Radiology No results found.  Procedures Procedures (including critical care time)  Medications Ordered in ED Medications - No data to display   Initial Impression / Assessment and Plan / ED Course  I have reviewed the triage vital signs and the nursing notes.  Pertinent labs & imaging results that were available during my care of the patient were reviewed by me and considered in my medical decision making (see chart for details).  Patient presents requesting refill of blood pressure medication, is being established with a new primary care doctor after his retired but was unable to get in to see them before his blood pressure medication ran out.  He denies chest pain, shortness of breath, lower extremity swelling or focal neurologic symptoms to suggest acute hypertensive urgency or emergency.  Will prescribe patient's metoprolol, given enough education to get him to his PCP appointment.  I have discussed appropriate return precautions and patient expresses understanding and agreement with plan.  Discharged home in good condition.  Final Clinical Impressions(s) / ED Diagnoses   Final diagnoses:  Medication refill  Hypertension, unspecified type    ED Discharge Orders         Ordered    metoprolol succinate (TOPROL-XL) 50 MG 24 hr tablet  Daily    Note to Pharmacy:  Please consider 90 day supplies to promote better adherence   06/02/18 1646           Jacqlyn Larsen, PA-C 06/02/18 [place], [place], DO 06/02/18 1907

## 2018-06-02 NOTE — Discharge Instructions (Signed)
Take your medication daily as directed and continue monitoring her blood pressure at home.  Follow-up with your primary care doctor as planned.  Return to the emergency department if you develop chest pain, shortness of breath, severe headache, vision changes, numbness or weakness on one side of your body or any other new or concerning symptoms.

## 2018-06-02 NOTE — ED Triage Notes (Signed)
Pt endorses being out of his hypertension meds for 1 week, came here for med refill. BP 161/89 in triage. Pt has no other complaints.

## 2018-06-26 ENCOUNTER — Emergency Department (HOSPITAL_COMMUNITY)
Admission: EM | Admit: 2018-06-26 | Discharge: 2018-06-26 | Disposition: A | Discharge status: Home / Self Care | Payer: Self-pay | Attending: Emergency Medicine | Admitting: Emergency Medicine

## 2018-06-26 ENCOUNTER — Emergency Department (HOSPITAL_COMMUNITY): Payer: Self-pay

## 2018-06-26 DIAGNOSIS — R079 Chest pain, unspecified: Secondary | ICD-10-CM | POA: Insufficient documentation

## 2018-06-26 DIAGNOSIS — Z79899 Other long term (current) drug therapy: Secondary | ICD-10-CM | POA: Insufficient documentation

## 2018-06-26 DIAGNOSIS — I10 Essential (primary) hypertension: Secondary | ICD-10-CM

## 2018-06-26 LAB — CBC WITH DIFFERENTIAL/PLATELET
ABS IMMATURE GRANULOCYTES: 0.02 10*3/uL (ref 0.00–0.07)
Basophils Absolute: 0.1 10*3/uL (ref 0.0–0.1)
Basophils Relative: 1 %
Eosinophils Absolute: 0.3 10*3/uL (ref 0.0–0.5)
Eosinophils Relative: 4 %
HCT: 45.2 % (ref 39.0–52.0)
Hemoglobin: 14.9 g/dL (ref 13.0–17.0)
Immature Granulocytes: 0 %
LYMPHS ABS: 1.7 10*3/uL (ref 0.7–4.0)
Lymphocytes Relative: 21 %
MCH: 27.7 pg (ref 26.0–34.0)
MCHC: 33 g/dL (ref 30.0–36.0)
MCV: 84.2 fL (ref 80.0–100.0)
Monocytes Absolute: 0.8 10*3/uL (ref 0.1–1.0)
Monocytes Relative: 10 %
Neutro Abs: 5 10*3/uL (ref 1.7–7.7)
Neutrophils Relative %: 64 %
Platelets: 238 10*3/uL (ref 150–400)
RBC: 5.37 MIL/uL (ref 4.22–5.81)
RDW: 11.9 % (ref 11.5–15.5)
WBC: 7.9 10*3/uL (ref 4.0–10.5)
nRBC: 0 % (ref 0.0–0.2)

## 2018-06-26 LAB — BASIC METABOLIC PANEL
Anion gap: 11 (ref 5–15)
BUN: 16 mg/dL (ref 6–20)
CHLORIDE: 107 mmol/L (ref 98–111)
CO2: 21 mmol/L — AB (ref 22–32)
Calcium: 8.9 mg/dL (ref 8.9–10.3)
Creatinine, Ser: 1.08 mg/dL (ref 0.61–1.24)
GFR calc Af Amer: >60 mL/min (ref >60)
GFR calc non Af Amer: >60 mL/min (ref >60)
Glucose, Bld: 117 mg/dL — ABNORMAL HIGH (ref 70–99)
POTASSIUM: 3.8 mmol/L (ref 3.5–5.1)
Sodium: 139 mmol/L (ref 135–145)

## 2018-06-26 LAB — TROPONIN I: Troponin I: <0.03 ng/mL (ref <0.03)

## 2018-06-26 MED ORDER — ASPIRIN 81 MG PO CHEW
324.0000 mg | CHEWABLE_TABLET | Freq: Once | ORAL | Status: AC
  Administered 2018-06-26: 324 mg via ORAL
  Filled 2018-06-26: qty 4

## 2018-06-26 NOTE — ED Notes (Addendum)
Pt has his BP journal with him. Pt stated he had a BP medication prescription a month ago and noticed the pills looked different. He stated the new medication was a long acting one and that was not what he was use to taking. Pt stated he has been having blurry vision and headaches and he gets these symptoms when his BP is high.  EMS stated there was a lot of commotion going on at his house when they arrived and his BP was lower once he got to the hospital.

## 2018-06-26 NOTE — Discharge Instructions (Signed)
Continue to monitor your blood pressure at home.  Return if symptoms are getting worse.

## 2018-06-26 NOTE — ED Triage Notes (Signed)
Patient BIB [facility] with c/o HTN. Symptoms of blurred vision, HA, nausea, dizziness and chest discomfort. Has a hx of HTN and previous MI. Initial BP with EMS 193/132 now 135/90.

## 2018-06-26 NOTE — ED Provider Notes (Signed)
[facility] EMERGENCY DEPARTMENT Provider Note   CSN: [id number] Arrival date & time: 06/26/18  0003    History   Chief Complaint Chief Complaint  Patient presents with  . Hypertension    HPI Adam [name] is a 53 y.o. male.  The history is provided by the patient.  Hypertension   He has history of hypertension and panic disorder and comes in because of elevated blood pressure at home.  He states his blood pressure went up to 191/114.  Usual blood pressure is in the range of 140/85.  He has had chest pain throughout the day.  He describes a pressure feeling which started about 11 AM and has been constant through the day, although it has essentially gone away.  He states that he actually has constant mild pressure sensation in his chest and that mild ongoing pressure sensation is still present.  He has history of COPD but has not noticed any increase in his baseline dyspnea.  He did have mild nausea during the day but has not vomited.  He denies diaphoresis.  He is complaining of feeling lightheaded which is worse when he stands.  He does relate that he had a new prescription for his metoprolol filled several weeks ago and that he believes it was an extended release version where his he had been taking an immediate release version.  He states his blood pressure is been poorly controlled since getting the new prescription refill.  He is a non-smoker.  He denies history of diabetes or hyperlipidemia.  There is a family history of premature coronary atherosclerosis.  Past Medical History:  Diagnosis Date  . Anxiety state, unspecified   . Esophageal reflux    no meds. per pt.  . Family history of stress   . Generalized headaches   . Hiatal hernia    pt. denies nerve/mucscle disease  . Hypertension   . IBS (irritable bowel syndrome)   . Major depressive disorder, single episode, unspecified   . Palpitations   . Panic disorder without agoraphobia   . Prostatitis,  unspecified   . RBBB (right bundle branch block)   . Sleep apnea   . Unspecified cardiovascular disease   . Visual disturbance     Patient Active Problem List   Diagnosis Date Noted  . Lipoma of abdominal wall 03/17/2014  . Onychomycosis 01/18/2014  . Pain, dental 12/16/2013  . Cervical strain, acute 12/16/2013  . Pneumonia 11/12/2013  . URI (upper respiratory infection) 08/13/2011  . Cough 08/09/2011  . IBS (irritable bowel syndrome)- Constipation Predominant 06/19/2011  . Headache(784.0) 05/14/2011  . Light-headedness 05/14/2011  . Left ear pain 05/14/2011  . Inguinal hernia, bilateral 03/02/2011  . Bilateral inguinal hernia 02/26/2011  . Dental abscess 02/15/2011  . Shortness of breath 01/30/2011  . Fatigue 12/13/2010  . Abdominal pain 10/20/2010  . Shoulder pain, bilateral 07/31/2010  . Orthopnea 07/31/2010  . BACK PAIN, CHRONIC 06/09/2010  . UNSPECIFIED VISUAL DISTURBANCE 12/12/2009  . UNSPECIFIED CONDITION OF BRAIN 10/19/2009  . ALLERGIC RHINITIS, SEASONAL 08/25/2009  . HYPOKALEMIA, HX OF 12/30/2008  . Essential hypertension, benign 08/04/2007  . LOW BACK PAIN, CHRONIC 08/04/2007  . Mood disorder NOS 05/28/2007  . PALPITATIONS 04/25/2007  . CHEST PAIN 04/18/2007  . PROSTATITIS NOS 08/14/2006  . ANXIETY 07/04/2006  . PANIC ATTACKS 07/04/2006  . GASTROESOPHAGEAL REFLUX, NO ESOPHAGITIS 07/04/2006    Past Surgical History:  Procedure Laterality Date  . COLONOSCOPY  07/26/2005   normal  . ESOPHAGOGASTRODUODENOSCOPY  01/03/2005   normal  . FRACTURE SURGERY  2000   left arm  . HERNIA REPAIR  03/14/2011   BIH  . INGUINAL HERNIA REPAIR  03/14/2011   Procedure: LAPAROSCOPIC BILATERAL INGUINAL HERNIA REPAIR;  Surgeon: Harl Bowie, MD;  Location: [facility];  Service: General;  Laterality: N/A;  with mesh  . pyloric stenosis     . right arm     fracture--Right arm fracture s/p ORIF from roller blading.  [name] testicle  53 years old   bilateral repair of undescended  testicles as child        Home Medications    Prior to Admission medications   Medication Sig Start Date End Date Taking? Authorizing Provider  albuterol (PROVENTIL HFA;VENTOLIN HFA) 108 (90 Base) MCG/ACT inhaler Inhale 1-2 puffs into the lungs every 6 (six) hours as needed for wheezing or shortness of breath. 03/17/18   Tacy Learn, PA-C  ALPRAZolam Duanne Moron) 1 MG tablet Take 1 mg by mouth as needed for anxiety. Take 1 tablet (1 mg) by mouth twice daily - mid afternoon and bedtime     [provider]  metoprolol succinate (TOPROL-XL) 50 MG 24 hr tablet Take 1 tablet (50 mg total) by mouth daily. Take with or immediately following a meal. 06/02/18 08/31/18  Jacqlyn Larsen, PA-C    Family History Family History  Problem Relation Age of Onset  . Heart attack Mother 29       PTCA  and 3 stents  . Heart disease Mother   . Hodgkin's lymphoma Maternal Grandmother   . Cancer Maternal Aunt        pt unaware of what kind  . Cancer Maternal Uncle        pt unaware of what kind  . Pyloric stenosis Daughter 85       X2  . Anxiety disorder Other   . Bipolar disorder Daughter     Social History Social History   Tobacco Use  . Smoking status: Never Smoker  . Smokeless tobacco: Never Used  Substance Use Topics  . Alcohol use: No  . Drug use: No     Allergies   Hctz [hydrochlorothiazide] and Penicillins   Review of Systems Review of Systems  All other systems reviewed and are negative.    Physical Exam Updated Vital Signs BP 129/85 (BP Location: Right Arm)   Pulse 62   Temp 98.3 F (36.8 C) (Oral)   Resp 16   SpO2 98%   Physical Exam Vitals signs and nursing note reviewed.    53 year old male, resting comfortably and in no acute distress. Vital signs are normal. Oxygen saturation is 98%, which is normal. Head is normocephalic and atraumatic. PERRLA, EOMI. Oropharynx is clear. Neck is nontender and supple without adenopathy or JVD. Back is nontender and  there is no CVA tenderness. Lungs are clear without rales, wheezes, or rhonchi. Chest is nontender. Heart has regular rate and rhythm without murmur. Abdomen is soft, flat, nontender without masses or hepatosplenomegaly and peristalsis is normoactive. Extremities have no cyanosis or edema, full range of motion is present. Skin is warm and dry without rash. Neurologic: Mental status is normal, cranial nerves are intact, there are no motor or sensory deficits.  ED Treatments / Results  Labs (all labs ordered are listed, but only abnormal results are displayed) Labs Reviewed  BASIC METABOLIC PANEL - Abnormal; Notable for the following components:      Result Value   CO2 21 (*)  Glucose, Bld 117 (*)    All other components within normal limits  CBC WITH DIFFERENTIAL/PLATELET  TROPONIN I    EKG EKG Interpretation  Date/Time:  Thursday June 26 2018 00:14:01 EST Ventricular Rate:  61 PR Interval:    QRS Duration: 92 QT Interval:  405 QTC Calculation: 408 R Axis:   27 Text Interpretation:  Sinus rhythm RSR' in V1 or V2, right VCD or RVH When compared with ECG of 03/17/2018, No significant change was found Confirmed by Delora Fuel ([id number]) on 06/26/2018 12:29:11 AM   Radiology Dg Chest 2 View  Result Date: 06/26/2018 CLINICAL DATA:  53 year old male with chest pain and high blood pressure. EXAM: CHEST - 2 VIEW COMPARISON:  03/17/2018 radiographs and earlier. FINDINGS: Lung volumes and mediastinal contours remain normal. Visualized tracheal air column is within normal limits. The lungs appear stable in clear. No pneumothorax or pleural effusion. No osseous abnormality identified. Negative visible bowel gas pattern. IMPRESSION: Negative.  No cardiopulmonary abnormality. Electronically Signed   By: Genevie Ann M.D.   On: 06/26/2018 01:07    Procedures Procedures   Medications Ordered in ED Medications  aspirin chewable tablet 324 mg (has no administration in time range)      Initial Impression / Assessment and Plan / ED Course  I have reviewed the triage vital signs and the nursing notes.  Pertinent labs & imaging results that were available during my care of the patient were reviewed by me and considered in my medical decision making (see chart for details).  Chest pain which does not seem likely to be cardiac.  Heart score is 2, which puts him at low risk for major adverse cardiac events in the next 6 weeks.  Blood pressure is normal in the emergency department.  He does seem very anxious.  ECG does not show any acute changes.  We will check orthostatic vital signs.  Will check troponin.  Anticipate he should be able to be discharged with outpatient follow-up.  Chest x-ray shows no acute process.  Troponin is normal as are rest of labs.  With duration of symptoms, I do not feel he needs delta troponin.  Blood pressure continues to be in an acceptable range in the ED.  He is encouraged to continue monitoring his blood pressure at home, follow-up with PCP in 1 week.  Patient has had problems with not having insurance and is concerned about the cost of going back to his primary care provider.  He is given Human resources officer.  Final Clinical Impressions(s) / ED Diagnoses   Final diagnoses:  Chest pain, unspecified type  Essential hypertension    ED Discharge Orders    None       Delora Fuel, MD 40/08/67 [phone]

## 2018-06-26 NOTE — ED Notes (Signed)
Patient verbalizes understanding of discharge instructions. Opportunity for questioning and answers were provided. Armband removed by staff, pt discharged from ED home via POV.  

## 2018-08-02 ENCOUNTER — Emergency Department (HOSPITAL_COMMUNITY)
Admission: EM | Admit: 2018-08-02 | Discharge: 2018-08-02 | Disposition: A | Discharge status: Home / Self Care | Payer: Medicaid Other | Attending: Emergency Medicine | Admitting: Emergency Medicine

## 2018-08-02 ENCOUNTER — Other Ambulatory Visit: Payer: Self-pay

## 2018-08-02 DIAGNOSIS — I10 Essential (primary) hypertension: Secondary | ICD-10-CM | POA: Diagnosis not present

## 2018-08-02 DIAGNOSIS — Z9109 Other allergy status, other than to drugs and biological substances: Secondary | ICD-10-CM

## 2018-08-02 DIAGNOSIS — J4520 Mild intermittent asthma, uncomplicated: Secondary | ICD-10-CM | POA: Insufficient documentation

## 2018-08-02 DIAGNOSIS — R0602 Shortness of breath: Secondary | ICD-10-CM | POA: Diagnosis present

## 2018-08-02 MED ORDER — ALBUTEROL SULFATE HFA 108 (90 BASE) MCG/ACT IN AERS: 2.0000 | INHALATION_SPRAY | RESPIRATORY_TRACT | 1 refills | Status: DC | PRN

## 2018-08-02 MED ORDER — PREDNISONE 20 MG PO TABS: 20.0000 mg | ORAL_TABLET | Freq: Two times a day (BID) | ORAL | 0 refills | Status: DC

## 2018-08-02 NOTE — ED Notes (Signed)
Pt given discharge instructions and follow up information. Pt given the opportunity to ask questions. Pt verbalized understanding.  

## 2018-08-02 NOTE — ED Provider Notes (Addendum)
[facility] EMERGENCY DEPARTMENT Provider Note   CSN: [id number] Arrival date & time: 08/02/18  0751    History   Chief Complaint Chief Complaint  Patient presents with  . Shortness of Breath    HPI Adam [name] is a 53 y.o. male.     HPI   He presents for evaluation of cough productive of sputum, which is clear, occasionally, wheezing, shortness of breath, itchy eyes and rhinorrhea for 1 week.  He typically has these symptoms every year in the spring.  He works outside as a Horticulturist, commercial.  He has not tried any medications other than an albuterol inhaler which he ran out of a couple of days ago.  He denies fever, chills, exposure to COVID-19, weakness or dizziness.  There are no other known modifying factors.  Past Medical History:  Diagnosis Date  . Anxiety state, unspecified   . Esophageal reflux    no meds. per pt.  . Family history of stress   . Generalized headaches   . Hiatal hernia    pt. denies nerve/mucscle disease  . Hypertension   . IBS (irritable bowel syndrome)   . Major depressive disorder, single episode, unspecified   . Palpitations   . Panic disorder without agoraphobia   . Prostatitis, unspecified   . RBBB (right bundle branch block)   . Sleep apnea   . Unspecified cardiovascular disease   . Visual disturbance     Patient Active Problem List   Diagnosis Date Noted  . Lipoma of abdominal wall 03/17/2014  . Onychomycosis 01/18/2014  . Pain, dental 12/16/2013  . Cervical strain, acute 12/16/2013  . Pneumonia 11/12/2013  . URI (upper respiratory infection) 08/13/2011  . Cough 08/09/2011  . IBS (irritable bowel syndrome)- Constipation Predominant 06/19/2011  . Headache(784.0) 05/14/2011  . Light-headedness 05/14/2011  . Left ear pain 05/14/2011  . Inguinal hernia, bilateral 03/02/2011  . Bilateral inguinal hernia 02/26/2011  . Dental abscess 02/15/2011  . Shortness of breath 01/30/2011  . Fatigue 12/13/2010  . Abdominal  pain 10/20/2010  . Shoulder pain, bilateral 07/31/2010  . Orthopnea 07/31/2010  . BACK PAIN, CHRONIC 06/09/2010  . UNSPECIFIED VISUAL DISTURBANCE 12/12/2009  . UNSPECIFIED CONDITION OF BRAIN 10/19/2009  . ALLERGIC RHINITIS, SEASONAL 08/25/2009  . HYPOKALEMIA, HX OF 12/30/2008  . Essential hypertension, benign 08/04/2007  . LOW BACK PAIN, CHRONIC 08/04/2007  . Mood disorder NOS 05/28/2007  . PALPITATIONS 04/25/2007  . CHEST PAIN 04/18/2007  . PROSTATITIS NOS 08/14/2006  . ANXIETY 07/04/2006  . PANIC ATTACKS 07/04/2006  . GASTROESOPHAGEAL REFLUX, NO ESOPHAGITIS 07/04/2006    Past Surgical History:  Procedure Laterality Date  . COLONOSCOPY  07/26/2005   normal  . ESOPHAGOGASTRODUODENOSCOPY  01/03/2005   normal  . FRACTURE SURGERY  2000   left arm  . HERNIA REPAIR  03/14/2011   BIH  . INGUINAL HERNIA REPAIR  03/14/2011   Procedure: LAPAROSCOPIC BILATERAL INGUINAL HERNIA REPAIR;  Surgeon: Harl Bowie, MD;  Location: [facility];  Service: General;  Laterality: N/A;  with mesh  . pyloric stenosis     . right arm     fracture--Right arm fracture s/p ORIF from roller blading.  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 Authorizing Provider  ALPRAZolam Duanne Moron) 1 MG tablet Take 0.5 mg by mouth at  bedtime.     [provider]  metoprolol succinate (TOPROL-XL) 50 MG 24 hr tablet Take 1 tablet (50 mg total) by mouth daily. Take with or immediately following a meal. 06/02/18 08/31/18  Jacqlyn Larsen, PA-C    Family History Family History  Problem Relation Age of Onset  . Heart attack Mother 62       PTCA  and 3 stents  . Heart disease Mother   . Hodgkin's lymphoma Maternal Grandmother   . Cancer Maternal Aunt        pt unaware of what kind  . Cancer Maternal Uncle        pt unaware of what kind  . Pyloric stenosis Daughter 19        X2  . Anxiety disorder Other   . Bipolar disorder Daughter     Social History Social History   Tobacco Use  . Smoking status: Never Smoker  . Smokeless tobacco: Never Used  Substance Use Topics  . Alcohol use: No  . Drug use: No     Allergies   Hctz [hydrochlorothiazide] and Penicillins   Review of Systems Review of Systems  All other systems reviewed and are negative.    Physical Exam Updated Vital Signs BP (!) 142/96 (BP Location: Left Arm)   Pulse (!) 57   Temp 98.2 F (36.8 C) (Oral)   Resp 16   Ht 5\' 6"  (1.676 m)   Wt 70.3 kg   SpO2 96%   BMI 25.02 kg/m   Physical Exam Vitals signs and nursing note reviewed.  Constitutional:      General: He is not in acute distress.    Appearance: He is well-developed. He is not ill-appearing, toxic-appearing or diaphoretic.  HENT:     Head: Normocephalic and atraumatic.     Right Ear: External ear normal.     Left Ear: External ear normal.  Eyes:     Conjunctiva/sclera: Conjunctivae normal.     Pupils: Pupils are equal, round, and reactive to light.  Neck:     Musculoskeletal: Normal range of motion and neck supple.     Trachea: Phonation normal.  Cardiovascular:     Rate and Rhythm: Normal rate and regular rhythm.     Heart sounds: Normal heart sounds.  Pulmonary:     Effort: Pulmonary effort is normal. No tachypnea, accessory muscle usage or respiratory distress.     Breath sounds: Wheezing present. No decreased breath sounds, rhonchi or rales.  Abdominal:     Palpations: Abdomen is soft.     Tenderness: There is no abdominal tenderness.  Musculoskeletal: Normal range of motion.  Skin:    General: Skin is warm and dry.  Neurological:     Mental Status: He is alert and oriented to person, place, and time.     Cranial Nerves: No cranial nerve deficit.     Sensory: No sensory deficit.     Motor: No abnormal muscle tone.     Coordination: Coordination normal.  Psychiatric:        Behavior: Behavior  normal.        Thought Content: Thought content normal.        Judgment: Judgment normal.      ED Treatments / Results  Labs (all labs ordered are listed, but only abnormal results are displayed) Labs Reviewed - No data to display  EKG None  Radiology No results found.  Procedures Procedures (including critical care time)  Medications Ordered in ED Medications -  No data to display   Initial Impression / Assessment and Plan / ED Course  I have reviewed the triage vital signs and the nursing notes.  Pertinent labs & imaging results that were available during my care of the patient were reviewed by me and considered in my medical decision making (see chart for details).         Patient Vitals for the past 24 hrs:  BP Temp Temp src Pulse Resp SpO2 Height Weight  08/02/18 0811 (!) 142/96 98.2 F (36.8 C) Oral (!) 57 16 96 % - -  08/02/18 6294 - - - - - - 5\' 6"  (1.676 m) 70.3 kg    8:40 AM Reevaluation with update and discussion. After initial assessment and treatment, an updated evaluation reveals he remains comfortable has no further complaints.  Findings discussed and questions answered. Daleen Bo   Medical Decision Making: Elevation consistent with seasonal allergies complicated by exacerbation of bronchitis.  Doubt pneumonia, suspect infection or impending vascular collapse.  Doubt COVID-19 infection.  Adam [name] was evaluated in Emergency Department on 08/02/2018 for the symptoms described in the history of present illness. He was evaluated in the context of the global COVID-19 pandemic, which necessitated consideration that the patient might be at risk for infection with the SARS-CoV-2 virus that causes COVID-19. Institutional protocols and algorithms that pertain to the evaluation of patients at risk for COVID-19 are in a state of rapid change based on information released by regulatory bodies including the CDC and federal and state organizations. These  policies and algorithms were followed during the patient's care in the ED.  CRITICAL CARE-no  Nursing Notes Reviewed/ Care Coordinated Applicable Imaging Reviewed Interpretation of Laboratory Data incorporated into ED treatment  The patient appears reasonably screened and/or stabilized for discharge and I doubt any other medical condition or other [facility] requiring further screening, evaluation, or treatment in the ED at this time prior to discharge.  Plan: Home Medications-OTC as needed; Home Treatments-rest, fluids; return here if the recommended treatment, does not improve the symptoms; Recommended follow up-PCP, PRN  Performed by: Daleen Bo    Final Clinical Impressions(s) / ED Diagnoses   Final diagnoses:  None    ED Discharge Orders    None       Daleen Bo, MD 08/02/18 7654    Daleen Bo, MD 08/02/18 0900

## 2018-08-02 NOTE — ED Triage Notes (Signed)
Pt reports shortness of breath for a few weeks ago. Pt reports that it feels like his allergies are causing it. Pt reports that he typically has to come to the hospital in the springtime due to his allergies. Pt reports increased mucus production during the day with a cough. Pt denies any pain. Pt denies any fevers, nausea, vomiting, chills or body aches. Pt reports he was taking mucinex at home without any relief. Pt denies any contact with anyone known to be positive for COVID-19.

## 2018-08-02 NOTE — Discharge Instructions (Addendum)
Prescriptions were sent to your pharmacy.  Additional treatments that can help on antihistamine such as Claritin or Zyrtec every day.  If you develop nasal symptoms of congestion, or more sneezing, consider trying Flonase nasal spray twice a day to help decrease the symptoms.  Make sure that you are getting plenty of rest and drinking a lot of fluids.  Watch for signs of developing viral illness including weakness, dizziness, shortness of breath, cough.  If you develop these symptoms, quarantine yourself away from people and seek medical care by phone or in person if needed for serious illness.

## 2018-09-06 ENCOUNTER — Encounter (HOSPITAL_COMMUNITY): Payer: Self-pay | Admitting: Emergency Medicine

## 2018-09-06 ENCOUNTER — Emergency Department (HOSPITAL_COMMUNITY): Payer: Medicaid Other

## 2018-09-06 ENCOUNTER — Other Ambulatory Visit: Payer: Self-pay

## 2018-09-06 ENCOUNTER — Emergency Department (HOSPITAL_COMMUNITY)
Admission: EM | Admit: 2018-09-06 | Discharge: 2018-09-06 | Disposition: A | Discharge status: Home / Self Care | Payer: Medicaid Other | Attending: Emergency Medicine | Admitting: Emergency Medicine

## 2018-09-06 DIAGNOSIS — J40 Bronchitis, not specified as acute or chronic: Secondary | ICD-10-CM | POA: Insufficient documentation

## 2018-09-06 DIAGNOSIS — R51 Headache: Secondary | ICD-10-CM | POA: Diagnosis not present

## 2018-09-06 DIAGNOSIS — R062 Wheezing: Secondary | ICD-10-CM | POA: Insufficient documentation

## 2018-09-06 DIAGNOSIS — Z79899 Other long term (current) drug therapy: Secondary | ICD-10-CM | POA: Diagnosis not present

## 2018-09-06 DIAGNOSIS — I10 Essential (primary) hypertension: Secondary | ICD-10-CM | POA: Insufficient documentation

## 2018-09-06 DIAGNOSIS — R0602 Shortness of breath: Secondary | ICD-10-CM | POA: Diagnosis present

## 2018-09-06 LAB — COMPREHENSIVE METABOLIC PANEL
ALT: 21 U/L (ref 0–44)
AST: 16 U/L (ref 15–41)
Albumin: 3.6 g/dL (ref 3.5–5.0)
Alkaline Phosphatase: 71 U/L (ref 38–126)
Anion gap: 7 (ref 5–15)
BUN: 13 mg/dL (ref 6–20)
CO2: 23 mmol/L (ref 22–32)
Calcium: 8.2 mg/dL — ABNORMAL LOW (ref 8.9–10.3)
Chloride: 110 mmol/L (ref 98–111)
Creatinine, Ser: 0.99 mg/dL (ref 0.61–1.24)
GFR calc Af Amer: >60 mL/min (ref >60)
GFR calc non Af Amer: >60 mL/min (ref >60)
Glucose, Bld: 99 mg/dL (ref 70–99)
Potassium: 3.7 mmol/L (ref 3.5–5.1)
Sodium: 140 mmol/L (ref 135–145)
Total Bilirubin: 0.6 mg/dL (ref 0.3–1.2)
Total Protein: 5.9 g/dL — ABNORMAL LOW (ref 6.5–8.1)

## 2018-09-06 LAB — C-REACTIVE PROTEIN: CRP: <0.8 mg/dL (ref <1.0)

## 2018-09-06 LAB — CBC WITH DIFFERENTIAL/PLATELET
Abs Immature Granulocytes: 0.01 10*3/uL (ref 0.00–0.07)
Basophils Absolute: 0.1 10*3/uL (ref 0.0–0.1)
Basophils Relative: 1 %
Eosinophils Absolute: 0.6 10*3/uL — ABNORMAL HIGH (ref 0.0–0.5)
Eosinophils Relative: 8 %
HCT: 47.9 % (ref 39.0–52.0)
Hemoglobin: 15.9 g/dL (ref 13.0–17.0)
Immature Granulocytes: 0 %
Lymphocytes Relative: 22 %
Lymphs Abs: 1.6 10*3/uL (ref 0.7–4.0)
MCH: 28 pg (ref 26.0–34.0)
MCHC: 33.2 g/dL (ref 30.0–36.0)
MCV: 84.5 fL (ref 80.0–100.0)
Monocytes Absolute: 0.6 10*3/uL (ref 0.1–1.0)
Monocytes Relative: 8 %
Neutro Abs: 4.4 10*3/uL (ref 1.7–7.7)
Neutrophils Relative %: 61 %
Platelets: 256 10*3/uL (ref 150–400)
RBC: 5.67 MIL/uL (ref 4.22–5.81)
RDW: 12.1 % (ref 11.5–15.5)
WBC: 7.3 10*3/uL (ref 4.0–10.5)
nRBC: 0 % (ref 0.0–0.2)

## 2018-09-06 LAB — TROPONIN I: Troponin I: <0.03 ng/mL (ref <0.03)

## 2018-09-06 LAB — BRAIN NATRIURETIC PEPTIDE: B Natriuretic Peptide: 48.1 pg/mL (ref 0.0–100.0)

## 2018-09-06 MED ORDER — ALBUTEROL SULFATE HFA 108 (90 BASE) MCG/ACT IN AERS
2.0000 | INHALATION_SPRAY | Freq: Once | RESPIRATORY_TRACT | Status: AC
  Administered 2018-09-06: 2 via RESPIRATORY_TRACT
  Filled 2018-09-06: qty 6.7

## 2018-09-06 MED ORDER — PREDNISONE 20 MG PO TABS: ORAL_TABLET | ORAL | 0 refills | Status: DC

## 2018-09-06 MED ORDER — PANTOPRAZOLE SODIUM 20 MG PO TBEC: 20.0000 mg | DELAYED_RELEASE_TABLET | Freq: Every day | ORAL | 0 refills | Status: DC

## 2018-09-06 MED ORDER — ALBUTEROL SULFATE HFA 108 (90 BASE) MCG/ACT IN AERS: 1.0000 | INHALATION_SPRAY | Freq: Four times a day (QID) | RESPIRATORY_TRACT | 2 refills | Status: DC | PRN

## 2018-09-06 NOTE — ED Triage Notes (Signed)
Pt. Stated, I was here about a month for the same problem and has never cleared up. I was given prednisone. Im still having a headache and feel tired.

## 2018-09-06 NOTE — ED Notes (Signed)
Patient verbalizes understanding of discharge instructions. Opportunity for questioning and answers were provided. Armband removed by staff, pt discharged from ED.  

## 2018-09-06 NOTE — Discharge Instructions (Addendum)
1.  Sometimes wheezing and coughing are triggered by chronic reflux (GERD), even if you do not have what are considered typical symptoms of burning in your throat or pain with eating.  Take Protonix as prescribed for at least 1 month to see if symptoms improve. 2.  You are being given a 2-week prednisone taper.  Start today and finish the complete taper.  Use albuterol inhaler every 4 hours for the next 3 days, then as needed. 3.  You are advised to schedule a follow-up appointment with a pulmonologist.

## 2018-09-06 NOTE — ED Triage Notes (Signed)
Pt. Stated, Im mostly SOB

## 2018-09-06 NOTE — ED Provider Notes (Signed)
[facility] EMERGENCY DEPARTMENT Provider Note   CSN: [id number] Arrival date & time: 09/06/18  1050    History   Chief Complaint Chief Complaint  Patient presents with  . headache/SOB  . Allergies    HPI Adam [name] is a 53 y.o. male.     HPI Patient reports he is been short of breath for over a month.  He was seen in the emergency department and treated with prednisone and albuterol inhaler.  He reports that there was minimal improvement with the prednisone.  He continues to wheeze and feel short of breath at rest as well as with minor exertion.  He feels like his chest is congested but he cannot cough anything out.  He denies he had any large amount of coughing at any point associated with this illness.  No documented fever.  Reports he does feel generally fatigued and has a decreased appetite.  No abdominal pain, no vomiting, no diarrhea.  He reports he typically does get seasonal allergy but this year he did not feel like it was the same because he has no watery eyes or itchy nose and eyes.  He is a non-smoker.  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  No swelling of the legs or calf pain.  He reports he has been sleeping slightly more propped up since this started.  He used to just sleep with a pillow  normally but now sleeps at about of 30 degrees angle.  Past Medical History:  Diagnosis Date  . Anxiety state, unspecified   . Esophageal reflux    no meds. per pt.  . Family history of stress   . Generalized headaches   . Hiatal hernia    pt. denies nerve/mucscle disease  . Hypertension   . IBS (irritable bowel syndrome)   . Major depressive disorder, single episode, unspecified   . Palpitations   . Panic disorder without agoraphobia   . Prostatitis, unspecified   . RBBB (right bundle branch block)   . Sleep apnea   . Unspecified cardiovascular disease   . Visual disturbance     Patient  Active Problem List   Diagnosis Date Noted  . Lipoma of abdominal wall 03/17/2014  . Onychomycosis 01/18/2014  . Pain, dental 12/16/2013  . Cervical strain, acute 12/16/2013  . Pneumonia 11/12/2013  . URI (upper respiratory infection) 08/13/2011  . Cough 08/09/2011  . IBS (irritable bowel syndrome)- Constipation Predominant 06/19/2011  . Headache(784.0) 05/14/2011  . Light-headedness 05/14/2011  . Left ear pain 05/14/2011  . Inguinal hernia, bilateral 03/02/2011  . Bilateral inguinal hernia 02/26/2011  . Dental abscess 02/15/2011  . Shortness of breath 01/30/2011  . Fatigue 12/13/2010  . Abdominal pain 10/20/2010  . Shoulder pain, bilateral 07/31/2010  . Orthopnea 07/31/2010  . BACK PAIN, CHRONIC 06/09/2010  . UNSPECIFIED VISUAL DISTURBANCE 12/12/2009  . UNSPECIFIED CONDITION OF BRAIN 10/19/2009  . ALLERGIC RHINITIS, SEASONAL 08/25/2009  . HYPOKALEMIA, HX OF 12/30/2008  . Essential hypertension, benign 08/04/2007  . LOW BACK PAIN, CHRONIC 08/04/2007  . Mood disorder NOS 05/28/2007  . PALPITATIONS 04/25/2007  . CHEST PAIN 04/18/2007  . PROSTATITIS NOS 08/14/2006  . ANXIETY 07/04/2006  . PANIC ATTACKS 07/04/2006  . GASTROESOPHAGEAL REFLUX, NO ESOPHAGITIS 07/04/2006    Past Surgical History:  Procedure Laterality Date  . COLONOSCOPY  07/26/2005   normal  . ESOPHAGOGASTRODUODENOSCOPY  01/03/2005   normal  . FRACTURE  SURGERY  2000   left arm  . HERNIA REPAIR  03/14/2011   BIH  . INGUINAL HERNIA REPAIR  03/14/2011   Procedure: LAPAROSCOPIC BILATERAL INGUINAL HERNIA REPAIR;  Surgeon: Harl Bowie, MD;  Location: [facility];  Service: General;  Laterality: N/A;  with mesh  . pyloric stenosis     . right arm     fracture--Right arm fracture s/p ORIF from roller blading.  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 Authorizing  Provider  albuterol (PROVENTIL HFA;VENTOLIN HFA) 108 (90 Base) MCG/ACT inhaler Inhale 2 puffs into the lungs every 4 (four) hours as needed for wheezing or shortness of breath. 08/02/18   Daleen Bo, MD  albuterol (VENTOLIN HFA) 108 (90 Base) MCG/ACT inhaler Inhale 1-2 puffs into the lungs every 6 (six) hours as needed for wheezing or shortness of breath. 09/06/18   Charlesetta Shanks, MD  ALPRAZolam Duanne Moron) 1 MG tablet Take 0.5 mg by mouth at bedtime.     [provider]  metoprolol succinate (TOPROL-XL) 50 MG 24 hr tablet Take 1 tablet (50 mg total) by mouth daily. Take with or immediately following a meal. 06/02/18 08/31/18  Jacqlyn Larsen, PA-C  pantoprazole (PROTONIX) 20 MG tablet Take 1 tablet (20 mg total) by mouth daily. 09/06/18   Charlesetta Shanks, MD  predniSONE (DELTASONE) 20 MG tablet Take 1 tablet (20 mg total) by mouth 2 (two) times daily. 08/02/18   Daleen Bo, MD  predniSONE (DELTASONE) 20 MG tablet 3 tabs po daily x 3 days, then 2 tabs x 3 days, then 1.5 tabs x 3 days, then 1 tab x 3 days, then 0.5 tabs x 3 days 09/06/18   Charlesetta Shanks, MD    Family History Family History  Problem Relation Age of Onset  . Heart attack Mother 97       PTCA  and 3 stents  . Heart disease Mother   . Hodgkin's lymphoma Maternal Grandmother   . Cancer Maternal Aunt        pt unaware of what kind  . Cancer Maternal Uncle        pt unaware of what kind  . Pyloric stenosis Daughter 23       X2  . Anxiety disorder Other   . Bipolar disorder Daughter     Social History Social History   Tobacco Use  . Smoking status: Never Smoker  . Smokeless tobacco: Never Used  Substance Use Topics  . Alcohol use: No  . Drug use: No     Allergies   Hctz [hydrochlorothiazide] and Penicillins   Review of Systems Review of Systems 10 Systems reviewed and are negative for acute change except as noted in the HPI.   Physical Exam Updated Vital Signs BP 135/89 (BP Location: Left Arm)   Pulse  (!) 59   Temp 98.6 F (37 C) (Oral)   Resp 19   Ht 5\' 6"  (1.676 m)   Wt 70.3 kg   SpO2 99%   BMI 25.02 kg/m   Physical Exam Constitutional:      Appearance: Normal appearance.     Comments: Well-nourished well-developed.  No respiratory distress at rest.  Eyes:     Extraocular Movements: Extraocular movements intact.     Conjunctiva/sclera: Conjunctivae normal.  Cardiovascular:     Rate  and Rhythm: Normal rate and regular rhythm.  Pulmonary:     Comments: No increased work of breathing.  Expiratory wheeze diffusely most pronounced in the mid lower lung fields.  Cough paroxysmal with deep inspiration.  No rhonchi no rales Abdominal:     General: There is no distension.     Palpations: Abdomen is soft.     Tenderness: There is no abdominal tenderness. There is no guarding.  Musculoskeletal: Normal range of motion.        General: No swelling or tenderness.     Right lower leg: No edema.     Left lower leg: No edema.  Skin:    General: Skin is warm and dry.  Neurological:     General: No focal deficit present.     Mental Status: He is alert and oriented to person, place, and time.     Coordination: Coordination normal.  Psychiatric:        Mood and Affect: Mood normal.      ED Treatments / Results  Labs (all labs ordered are listed, but only abnormal results are displayed) Labs Reviewed  COMPREHENSIVE METABOLIC PANEL - Abnormal; Notable for the following components:      Result Value   Calcium 8.2 (*)    Total Protein 5.9 (*)    All other components within normal limits  CBC WITH DIFFERENTIAL/PLATELET - Abnormal; Notable for the following components:   Eosinophils Absolute 0.6 (*)    All other components within normal limits  TROPONIN I  C-REACTIVE PROTEIN  BRAIN NATRIURETIC PEPTIDE    EKG EKG Interpretation  Date/Time:  Saturday Sep 06 2018 12:14:21 EDT Ventricular Rate:  54 PR Interval:    QRS Duration: 105 QT Interval:  412 QTC Calculation: 391 R  Axis:   32 Text Interpretation:  Sinus rhythm Low voltage, precordial leads Abnormal R-wave progression, early transition no change from previous Confirmed by Charlesetta Shanks [phone]) on 09/06/2018 12:30:25 PM   Radiology Dg Chest 2 View  Result Date: 09/06/2018 CLINICAL DATA:  Shortness of breath, wheezing, dry cough for 1 month EXAM: CHEST - 2 VIEW COMPARISON:  06/26/2018 FINDINGS: The heart size and mediastinal contours are within normal limits. Both lungs are clear. The visualized skeletal structures are unremarkable. IMPRESSION: No active cardiopulmonary disease. Electronically Signed   By: Kathreen Devoid   On: 09/06/2018 12:45   Ct Chest High Resolution  Result Date: 09/06/2018 CLINICAL DATA:  Nonsmoker. Worsening dyspnea with wheezing. Clinical concern for interstitial lung disease. EXAM: CT CHEST WITHOUT CONTRAST TECHNIQUE: Multidetector CT imaging of the chest was performed following the standard protocol without intravenous contrast. High resolution imaging of the lungs, as well as inspiratory and expiratory imaging, was performed. COMPARISON:  09/06/2018 chest radiograph. FINDINGS: Cardiovascular: Normal heart size. No significant pericardial effusion/thickening. Great vessels are normal in course and caliber. Mediastinum/Nodes: Hypodense 1.2 cm right thyroid lobe nodule. Unremarkable esophagus. No pathologically enlarged axillary, mediastinal or hilar lymph nodes, noting limited sensitivity for the detection of hilar adenopathy on this noncontrast study. Lungs/Pleura: No pneumothorax. No pleural effusion. No acute consolidative airspace disease or lung masses. Basilar right upper lobe 5 mm solid pulmonary nodule associated with the major fissure (series 10/image 69). No additional significant pulmonary nodules. No significant air trapping on the expiration sequence. No significant regions of subpleural reticulation, ground-glass attenuation, traction bronchiectasis, architectural distortion or Marshaun  honeycombing. Mild diffuse bronchial wall thickening. Upper abdomen: No acute abnormality. Musculoskeletal:  No aggressive appearing focal osseous lesions. IMPRESSION: 1. No  evidence of interstitial lung disease. 2. Nonspecific mild diffuse bronchial wall thickening, as can be seen with reactive airways disease and/or chronic bronchitis. 3. Solitary 5 mm subpleural right upper lobe solid pulmonary nodule. No follow-up needed if patient is low-risk. Non-contrast chest CT can be considered in 12 months if patient is high-risk. This recommendation follows the consensus statement: Guidelines for Management of Incidental Pulmonary Nodules Detected on CT Images:From the Fleischner Society 2017; published online before print (10.1148/radiol.[id number]). Electronically Signed   By: Ilona Sorrel M.D.   On: 09/06/2018 14:39    Procedures Procedures (including critical care time)  Medications Ordered in ED Medications  albuterol (VENTOLIN HFA) 108 (90 Base) MCG/ACT inhaler 2 puff (2 puffs Inhalation Given 09/06/18 1231)     Initial Impression / Assessment and Plan / ED Course  I have reviewed the triage vital signs and the nursing notes.  Pertinent labs & imaging results that were available during my care of the patient were reviewed by me and considered in my medical decision making (see chart for details).       Patient presents with persistent and recurrent wheezing and shortness of breath.  He does have objective wheezing on exam.  He is a non-smoker.  He had prolonged work exposure to [employer].  Fortunately, resolution CT does not show any interstitial lung disease.  Mild findings consistent with bronchitis.  No other acute findings.  Patient is counseled on starting Protonix daily for possible reflux induced wheezing.  Is also counseled on a 2-week taper of prednisone and use of albuterol inhaler.  He is counseled for follow-up with pulmonology for further evaluation.  Return precautions reviewed.   Final Clinical Impressions(s) / ED Diagnoses   Final diagnoses:  Bronchitis  Wheeze    ED Discharge Orders         Ordered    pantoprazole (PROTONIX) 20 MG tablet  Daily     09/06/18 1518    predniSONE (DELTASONE) 20 MG tablet     09/06/18 1518    albuterol (VENTOLIN HFA) 108 (90 Base) MCG/ACT inhaler  Every 6 hours PRN     09/06/18 1518           Charlesetta Shanks, MD 09/06/18 1523

## 2019-05-10 ENCOUNTER — Other Ambulatory Visit: Payer: Self-pay

## 2019-05-10 ENCOUNTER — Encounter (HOSPITAL_COMMUNITY): Payer: Self-pay | Admitting: Emergency Medicine

## 2019-05-10 ENCOUNTER — Emergency Department (HOSPITAL_COMMUNITY)
Admission: EM | Admit: 2019-05-10 | Discharge: 2019-05-10 | Disposition: A | Discharge status: Home / Self Care | Payer: Medicaid Other | Attending: Emergency Medicine | Admitting: Emergency Medicine

## 2019-05-10 DIAGNOSIS — R1084 Generalized abdominal pain: Secondary | ICD-10-CM

## 2019-05-10 DIAGNOSIS — Z79899 Other long term (current) drug therapy: Secondary | ICD-10-CM | POA: Diagnosis not present

## 2019-05-10 DIAGNOSIS — I10 Essential (primary) hypertension: Secondary | ICD-10-CM | POA: Diagnosis not present

## 2019-05-10 DIAGNOSIS — R195 Other fecal abnormalities: Secondary | ICD-10-CM | POA: Diagnosis not present

## 2019-05-10 LAB — COMPREHENSIVE METABOLIC PANEL
ALT: 22 U/L (ref 0–44)
AST: 20 U/L (ref 15–41)
Albumin: 4.1 g/dL (ref 3.5–5.0)
Alkaline Phosphatase: 73 U/L (ref 38–126)
Anion gap: 8 (ref 5–15)
BUN: 12 mg/dL (ref 6–20)
CO2: 23 mmol/L (ref 22–32)
Calcium: 9.2 mg/dL (ref 8.9–10.3)
Chloride: 107 mmol/L (ref 98–111)
Creatinine, Ser: 0.97 mg/dL (ref 0.61–1.24)
GFR calc Af Amer: >60 mL/min (ref >60)
GFR calc non Af Amer: >60 mL/min (ref >60)
Glucose, Bld: 118 mg/dL — ABNORMAL HIGH (ref 70–99)
Potassium: 4.2 mmol/L (ref 3.5–5.1)
Sodium: 138 mmol/L (ref 135–145)
Total Bilirubin: 1 mg/dL (ref 0.3–1.2)
Total Protein: 6.9 g/dL (ref 6.5–8.1)

## 2019-05-10 LAB — CBC
HCT: 46.9 % (ref 39.0–52.0)
Hemoglobin: 15.5 g/dL (ref 13.0–17.0)
MCH: 28.7 pg (ref 26.0–34.0)
MCHC: 33 g/dL (ref 30.0–36.0)
MCV: 86.7 fL (ref 80.0–100.0)
Platelets: 241 10*3/uL (ref 150–400)
RBC: 5.41 MIL/uL (ref 4.22–5.81)
RDW: 11.8 % (ref 11.5–15.5)
WBC: 4.9 10*3/uL (ref 4.0–10.5)
nRBC: 0 % (ref 0.0–0.2)

## 2019-05-10 LAB — URINALYSIS, ROUTINE W REFLEX MICROSCOPIC
Bilirubin Urine: NEGATIVE
Glucose, UA: NEGATIVE mg/dL
Hgb urine dipstick: NEGATIVE
Ketones, ur: NEGATIVE mg/dL
Leukocytes,Ua: NEGATIVE
Nitrite: NEGATIVE
Protein, ur: NEGATIVE mg/dL
Specific Gravity, Urine: 1.008 (ref 1.005–1.030)
pH: 6 (ref 5.0–8.0)

## 2019-05-10 LAB — LIPASE, BLOOD: Lipase: 25 U/L (ref 11–51)

## 2019-05-10 LAB — POC OCCULT BLOOD, ED: Fecal Occult Bld: NEGATIVE

## 2019-05-10 LAB — TYPE AND SCREEN
ABO/RH(D): B POS
Antibody Screen: NEGATIVE

## 2019-05-10 LAB — ABO/RH: ABO/RH(D): B POS

## 2019-05-10 MED ORDER — DICYCLOMINE HCL 20 MG PO TABS: 20.0000 mg | ORAL_TABLET | Freq: Three times a day (TID) | ORAL | 0 refills | Status: DC | PRN

## 2019-05-10 MED ORDER — SODIUM CHLORIDE 0.9% FLUSH: 3.0000 mL | Freq: Once | INTRAVENOUS | Status: DC

## 2019-05-10 NOTE — ED Triage Notes (Signed)
C/o generalized abd pain x 2 months.  Reports black stool x 1 month.  Denies nausea and vomiting.  Reports constipation.

## 2019-05-10 NOTE — ED Notes (Signed)
ED Provider at bedside. 

## 2019-05-12 NOTE — ED Provider Notes (Signed)
Adam [name]   CSN: [id number] Arrival date & time: 05/10/19  1035     History Chief Complaint  Patient presents with  . Abdominal Pain  . Melena    Adam [name] is a 54 y.o. male.  HPI   60yM with numerous complaints. He reports history of IBS. In past two months he has had intermittent abdominal pain. Migrates. Comes and goes w/o appreciable exacerbating or relieving factors. No n/v. No urinary complaints. No fever or chills. Stool has been black in color for past month. No blood thinners. Denies hx of GIB. He has concern that he may have "a prostate infection." No rectal pain per say but sometimes feels pressure.   Past Medical History:  Diagnosis Date  . Anxiety state, unspecified   . Esophageal reflux    no meds. per pt.  . Family history of stress   . Generalized headaches   . Hiatal hernia    pt. denies nerve/mucscle disease  . Hypertension   . IBS (irritable bowel syndrome)   . Major depressive disorder, single episode, unspecified   . Palpitations   . Panic disorder without agoraphobia   . Prostatitis, unspecified   . RBBB (right bundle branch block)   . Sleep apnea   . Unspecified cardiovascular disease   . Visual disturbance     Patient Active Problem List   Diagnosis Date Noted  . Lipoma of abdominal wall 03/17/2014  . Onychomycosis 01/18/2014  . Pain, dental 12/16/2013  . Cervical strain, acute 12/16/2013  . Pneumonia 11/12/2013  . URI (upper respiratory infection) 08/13/2011  . Cough 08/09/2011  . IBS (irritable bowel syndrome)- Constipation Predominant 06/19/2011  . Headache(784.0) 05/14/2011  . Light-headedness 05/14/2011  . Left ear pain 05/14/2011  . Inguinal hernia, bilateral 03/02/2011  . Bilateral inguinal hernia 02/26/2011  . Dental abscess 02/15/2011  . Shortness of breath 01/30/2011  . Fatigue 12/13/2010  . Abdominal pain 10/20/2010  . Shoulder pain, bilateral 07/31/2010  .  Orthopnea 07/31/2010  . BACK PAIN, CHRONIC 06/09/2010  . UNSPECIFIED VISUAL DISTURBANCE 12/12/2009  . UNSPECIFIED CONDITION OF BRAIN 10/19/2009  . ALLERGIC RHINITIS, SEASONAL 08/25/2009  . HYPOKALEMIA, HX OF 12/30/2008  . Essential hypertension, benign 08/04/2007  . LOW BACK PAIN, CHRONIC 08/04/2007  . Mood disorder NOS 05/28/2007  . PALPITATIONS 04/25/2007  . CHEST PAIN 04/18/2007  . PROSTATITIS NOS 08/14/2006  . ANXIETY 07/04/2006  . PANIC ATTACKS 07/04/2006  . GASTROESOPHAGEAL REFLUX, NO ESOPHAGITIS 07/04/2006    Past Surgical History:  Procedure Laterality Date  . COLONOSCOPY  07/26/2005   normal  . ESOPHAGOGASTRODUODENOSCOPY  01/03/2005   normal  . FRACTURE SURGERY  2000   left arm  . HERNIA REPAIR  03/14/2011   BIH  . INGUINAL HERNIA REPAIR  03/14/2011   Procedure: LAPAROSCOPIC BILATERAL INGUINAL HERNIA REPAIR;  Surgeon: Adam Bowie, MD;  Location: [facility];  Service: General;  Laterality: N/A;  with mesh  . pyloric stenosis     . right arm     fracture--Right arm fracture s/p ORIF from roller blading.  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 Heart attack Mother 80       PTCA  and 3 stents  . Heart disease Mother   . Hodgkin's lymphoma Maternal Grandmother   . Cancer Maternal Aunt  pt unaware of what kind  . Cancer Maternal Uncle        pt unaware of what kind  . Pyloric stenosis Daughter 60       X2  . Anxiety disorder Other   . Bipolar disorder Daughter     Social History   Tobacco Use  . Smoking status: Never Smoker  . Smokeless tobacco: Never Used  Substance Use Topics  . Alcohol use: No  . Drug use: No    Home Medications Prior to Admission medications   Medication Sig Start Date End Date Taking? Authorizing Provider  albuterol (PROVENTIL HFA;VENTOLIN HFA) 108 (90 Base) MCG/ACT inhaler Inhale 2 puffs into the lungs every 4 (four) hours as  needed for wheezing or shortness of breath. 08/02/18   Adam Bo, MD  albuterol (VENTOLIN HFA) 108 (90 Base) MCG/ACT inhaler Inhale 1-2 puffs into the lungs every 6 (six) hours as needed for wheezing or shortness of breath. 09/06/18   Adam Shanks, MD  ALPRAZolam Adam [name]) 1 MG tablet Take 0.5 mg by mouth at bedtime.     [provider]  dicyclomine (BENTYL) 20 MG tablet Take 1 tablet (20 mg total) by mouth every 8 (eight) hours as needed for spasms. 05/10/19   Adam Manifold, MD  metoprolol succinate (TOPROL-XL) 50 MG 24 hr tablet Take 1 tablet (50 mg total) by mouth daily. Take with or immediately following a meal. 06/02/18 08/31/18  Adam Larsen, PA-C  pantoprazole (PROTONIX) 20 MG tablet Take 1 tablet (20 mg total) by mouth daily. 09/06/18   Adam Shanks, MD  predniSONE (DELTASONE) 20 MG tablet Take 1 tablet (20 mg total) by mouth 2 (two) times daily. 08/02/18   Adam Bo, MD  predniSONE (DELTASONE) 20 MG tablet 3 tabs po daily x 3 days, then 2 tabs x 3 days, then 1.5 tabs x 3 days, then 1 tab x 3 days, then 0.5 tabs x 3 days 09/06/18   Adam Shanks, MD    Allergies    Hctz [hydrochlorothiazide] and Penicillins  Review of Systems   Review of Systems All systems reviewed and negative, other than as noted in HPI.  Physical Exam Updated Vital Signs BP 129/81   Pulse (!) 53   Temp 98.3 F (36.8 C) (Oral)   Resp 16   SpO2 98%   Physical Exam Vitals and nursing [name] reviewed.  Constitutional:      General: He is not in acute distress.    Appearance: He is well-developed.  HENT:     Head: Normocephalic and atraumatic.  Eyes:     General:        Right eye: No discharge.        Left eye: No discharge.     Conjunctiva/sclera: Conjunctivae normal.  Cardiovascular:     Rate and Rhythm: Normal rate and regular rhythm.     Heart sounds: Normal heart sounds. No murmur. No friction rub. No gallop.   Pulmonary:     Effort: Pulmonary effort is normal. No respiratory  distress.     Breath sounds: Normal breath sounds.  Abdominal:     General: There is no distension.     Palpations: Abdomen is soft.     Tenderness: There is no abdominal tenderness.  Genitourinary:    Comments: No concerning lesions noted. Normal tone. Prostate smooth, non-enlarged and didn't seem overtly tender. Scant dark brown stool on glove.  Musculoskeletal:        General: No tenderness.  Cervical back: Neck supple.  Skin:    General: Skin is warm and dry.  Neurological:     Mental Status: He is alert.  Psychiatric:        Behavior: Behavior normal.        Thought Content: Thought content normal.     ED Results / Procedures / Treatments   Labs (all labs ordered are listed, but only abnormal results are displayed) Labs Reviewed  COMPREHENSIVE METABOLIC PANEL - Abnormal; Notable for the following components:      Result Value   Glucose, Bld 118 (*)    All other components within normal limits  URINALYSIS, ROUTINE W REFLEX MICROSCOPIC - Abnormal; Notable for the following components:   Color, Urine STRAW (*)    All other components within normal limits  LIPASE, BLOOD  CBC  POC OCCULT BLOOD, ED  TYPE AND SCREEN  ABO/RH    EKG None  Radiology No results found.  Procedures Procedures (including critical care time)  Medications Ordered in ED Medications - No data to display  ED Course  I have reviewed the triage vital signs and the nursing notes.  Pertinent labs & imaging results that were available during my care of the patient were reviewed by me and considered in my medical decision making (see chart for details).    MDM Rules/Calculators/A&P                      53yM with numerous complaints. Exam and work-up reassuring. I doubt emergent process. Tried to reassure. Either PCP or GI follow-up.   It has been determined that no acute conditions requiring further emergency intervention are present at this time. The patient has been advised of the  diagnosis and plan. I reviewed any labs and imaging including any potential incidental findings. I have reviewed nursing notes and appropriate previous records. We have discussed signs and symptoms that warrant return to the ED and they are listed in the discharge instructions.      Final Clinical Impression(s) / ED Diagnoses Final diagnoses:  Generalized abdominal pain  Dark stools    Rx / DC Orders ED Discharge Orders         Ordered    dicyclomine (BENTYL) 20 MG tablet  Every 8 hours PRN     05/10/19 1432           Adam Manifold, MD 05/12/19 [phone]

## 2019-05-19 ENCOUNTER — Other Ambulatory Visit: Payer: Self-pay

## 2019-05-19 ENCOUNTER — Encounter: Payer: Self-pay | Admitting: Physician Assistant

## 2019-05-19 ENCOUNTER — Ambulatory Visit (HOSPITAL_COMMUNITY)
Admission: RE | Admit: 2019-05-19 | Discharge: 2019-05-19 | Disposition: A | Discharge status: Home / Self Care | Payer: Medicaid Other | Source: Ambulatory Visit | Attending: Physician Assistant | Admitting: Physician Assistant

## 2019-05-19 ENCOUNTER — Ambulatory Visit (INDEPENDENT_AMBULATORY_CARE_PROVIDER_SITE_OTHER): Payer: Self-pay | Admitting: Physician Assistant

## 2019-05-19 VITALS — BP 118/80 | HR 78 | Temp 97.7°F | Ht 66.0 in | Wt 157.0 lb

## 2019-05-19 DIAGNOSIS — Z800 Family history of malignant neoplasm of digestive organs: Secondary | ICD-10-CM

## 2019-05-19 DIAGNOSIS — R1030 Lower abdominal pain, unspecified: Secondary | ICD-10-CM | POA: Diagnosis present

## 2019-05-19 DIAGNOSIS — R194 Change in bowel habit: Secondary | ICD-10-CM | POA: Diagnosis present

## 2019-05-19 MED ORDER — IOHEXOL 300 MG/ML  SOLN
100.0000 mL | Freq: Once | INTRAMUSCULAR | Status: AC | PRN
  Administered 2019-05-19: 100 mL via INTRAVENOUS

## 2019-05-19 NOTE — Progress Notes (Signed)
Agree with assessment and plan as outlined.  

## 2019-05-19 NOTE — Patient Instructions (Addendum)
If you are age 55 or older, your body mass index should be between 23-30. Your, please consider follow up with your Primary Care Provider.  Body mass index is 25.34 kg/m. If this is out of the aforementioned range listed If you are age 30 or younger, your body mass index should be between 19-25. Your Body mass index is 25.34 kg/m. If this is out of the aformentioned range listed, please consider follow up with your Primary Care Provider.   Start Miralax daily.  Can use Dicyclomine as needed.   You have been scheduled for a CT scan of the abdomen and pelvis at [facility] are scheduled on Tuesday 05/19/19 at 3:30 pm. You should arrive 15 minutes prior to your appointment time for registration. Please follow the written instructions below on the day of your exam:  WARNING: IF YOU ARE ALLERGIC TO IODINE/X-RAY DYE, PLEASE NOTIFY RADIOLOGY IMMEDIATELY AT [phone]! YOU WILL BE GIVEN A 13 HOUR PREMEDICATION PREP.  1) Do not eat or drink anything after 11:30 am (4 hours prior to your test) 2) You have been given 2 bottles of oral contrast to drink. The solution may taste better if refrigerated, but do NOT add ice or any other liquid to this solution. Shake well before drinking.    Drink 1 bottle of contrast @ 1:30 pm (2 hours prior to your exam)  Drink 1 bottle of contrast @ 2:30 pm (1 hour prior to your exam)  You may take any medications as prescribed with a small amount of water, if necessary. If you take any of the following medications: METFORMIN, GLUCOPHAGE, GLUCOVANCE, AVANDAMET, RIOMET, FORTAMET, Juda MET, JANUMET, GLUMETZA or METAGLIP, you MAY be asked to HOLD this medication 48 hours AFTER the exam.  The purpose of you drinking the oral contrast is to aid in the visualization of your intestinal tract. The contrast solution may cause some diarrhea. Depending on your individual set of symptoms, you may also receive an intravenous injection of x-ray contrast/dye.  This  test typically takes 30-45 minutes to complete. _________________________________________________________________  Dennis Bast have been scheduled for a colonoscopy. Please follow written instructions given to you at your visit today.  Please pick up your prep supplies at the pharmacy within the next 1-3 days. If you use inhalers (even only as needed), please bring them with you on the day of your procedure.

## 2019-05-19 NOTE — Progress Notes (Signed)
Chief Complaint: Follow-up ER visit for abdominal pain  HPI:    [name] is a 54 year old Caucasian male, known to Dr. Carlean [name], with a past medical history as listed below including IBS and others, who presents clinic today for follow-up after being seen in the ER for abdominal pain.    07/26/2005 colonoscopy for constipation weight loss, abdominal pain and bloating with a change in bowel habits was normal.  At that time patient was given Zelnorm 6 mg before breakfast and supper.  Repeat recommended in 10 years.    06/19/2011 patient seen in clinic for IBS    05/10/2019 patient presented to the ER and described that for 2 months he has had intermittent abdominal pain which migrated around his abdomen and would come and go.  Stools also been black in color for the past month.  Rectal exam with no concerning lesions, normal tone and scant dark brown stool.  Labs including a CMP, CBC, lipase, occult blood, urinalysis were all normal.  Patient was given dicyclomine 20 mg to take every 8 hours as needed.  At that time he was given MiraLAX twice daily with intermittent Dulcolax 1-2.  That was unsuccessful as discussed he could try Dicyclomine.    Today, the patient presents to clinic and explains that he has always had some chronic problems with lower abdominal pain and constipation but over the past 2 months this has worsened and is "unbearable".  He rates this as a constant 9-10/10 and having a bowel movement sometimes makes it hurt worse.  Nothing seems to help it.  Tells me it feels like someone is "pouring alcohol over an open wound on your finger".  Explains it also radiates down into his testicles and he feels like it is somewhat hard to pee.  He wonders if he has a prostate infection which he has had in the past.  Tells me he was unimpressed with the ER work-up as above.  This pain also radiates through to his back and feels like he is "swollen inside".  Tells me he has only had 3 bowel movements  since being seen in the ER on the as above and this is with the use of Ex-Lax.  When he does have a stool it is very dark and has a funny smell.  Tells me he cannot sleep due to the discomfort from this pain.  Has not tried Dicyclomine because "it is not cramping".  Also tells me he has a decreased appetite because eating seems to worsen the pain.  Denies any heartburn, reflux, nausea or vomiting.    Family history positive for colon cancer diagnosis in his brother at the age of 56.  It was metastatic and he died within 3 days of finding out.    Denies fever, chills, weight loss, change in medications or diet.  Past Medical History:  Diagnosis Date  . Anxiety state, unspecified   . Esophageal reflux    no meds. per pt.  . Family history of stress   . Generalized headaches   . Hiatal hernia    pt. denies nerve/mucscle disease  . Hypertension   . IBS (irritable bowel syndrome)   . Major depressive disorder, single episode, unspecified   . Palpitations   . Panic disorder without agoraphobia   . Prostatitis, unspecified   . RBBB (right bundle branch block)   . Sleep apnea   . Unspecified cardiovascular disease   . Visual disturbance     Past Surgical History:  Procedure Laterality Date  . COLONOSCOPY  07/26/2005   normal  . ESOPHAGOGASTRODUODENOSCOPY  01/03/2005   normal  . FRACTURE SURGERY  2000   left arm  . HERNIA REPAIR  03/14/2011   BIH  . INGUINAL HERNIA REPAIR  03/14/2011   Procedure: LAPAROSCOPIC BILATERAL INGUINAL HERNIA REPAIR;  Surgeon: Adam Bowie, MD;  Location: [facility];  Service: General;  Laterality: N/A;  with mesh  . pyloric stenosis     . right arm     fracture--Right arm fracture s/p ORIF from roller blading.  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 albuterol (PROVENTIL HFA;VENTOLIN HFA) 108 (90 Base) MCG/ACT inhaler Inhale 2 puffs into the lungs  every 4 (four) hours as needed for wheezing or shortness of breath. 1 Inhaler 1  . albuterol (VENTOLIN HFA) 108 (90 Base) MCG/ACT inhaler Inhale 1-2 puffs into the lungs every 6 (six) hours as needed for wheezing or shortness of breath. 1 Inhaler 2  . ALPRAZolam (XANAX) 1 MG tablet Take 0.5 mg by mouth at bedtime.     . dicyclomine (BENTYL) 20 MG tablet Take 1 tablet (20 mg total) by mouth every 8 (eight) hours as needed for spasms. 12 tablet 0  . metoprolol succinate (TOPROL-XL) 50 MG 24 hr tablet Take 1 tablet (50 mg total) by mouth daily. Take with or immediately following a meal. 90 tablet 0  . pantoprazole (PROTONIX) 20 MG tablet Take 1 tablet (20 mg total) by mouth daily. 30 tablet 0  . predniSONE (DELTASONE) 20 MG tablet Take 1 tablet (20 mg total) by mouth 2 (two) times daily. 10 tablet 0  . predniSONE (DELTASONE) 20 MG tablet 3 tabs po daily x 3 days, then 2 tabs x 3 days, then 1.5 tabs x 3 days, then 1 tab x 3 days, then 0.5 tabs x 3 days 27 tablet 0   No current facility-administered medications for this visit.    Allergies as of 05/19/2019 - Review Complete 09/06/2018  Allergen Reaction Noted  . Hctz [hydrochlorothiazide] Other (See Comments) 11/16/2010  . Penicillins Other (See Comments) 07/17/2007    Family History  Problem Relation Age of Onset  . Heart attack Mother 63       PTCA  and 3 stents  . Heart disease Mother   . Hodgkin's lymphoma Maternal Grandmother   . Cancer Maternal Aunt        pt unaware of what kind  . Cancer Maternal Uncle        pt unaware of what kind  . Pyloric stenosis Daughter 57       X2  . Anxiety disorder Other   . Bipolar disorder Daughter     Social History   Socioeconomic History  . Marital status: Married    Spouse name: Not on file  . Number of children: 76  . Years of education: Not on file  . Highest education level: Not on file  Occupational History  . Occupation: Horticulturist, commercial    Employer: [employer]  Tobacco Use  . Smoking  status: Never Smoker  . Smokeless tobacco: Never Used  Substance and Sexual Activity  . Alcohol use: No  . Drug use: No  . Sexual activity: Not on file  Other Topics Concern  . Not on file  Social History Narrative   Brick mason x 2yrs.  He has nine children, 2 from  first marriage ages 12 and 25, and 43 from 57rd, age range 34mo to 55 yo.  Currently on 3rd marriage for last 4 years.  Seven children live with him which cause him significant stress.   Denies smoking, EtOH, recreational drugs, + remote h/o MJ 15 yrs ago.         Social Determinants of Health   Financial Resource Strain:   . Difficulty of Paying Living Expenses: Not on file  Food Insecurity:   . Worried About Charity fundraiser in the Last Year: Not on file  . Ran Out of Food in the Last Year: Not on file  Transportation Needs:   . Lack of Transportation (Medical): Not on file  . Lack of Transportation (Non-Medical): Not on file  Physical Activity:   . Days of Exercise per Week: Not on file  . Minutes of Exercise per Session: Not on file  Stress:   . Feeling of Stress : Not on file  Social Connections:   . Frequency of Communication with Friends and Family: Not on file  . Frequency of Social Gatherings with Friends and Family: Not on file  . Attends Religious Services: Not on file  . Active Member of Clubs or Organizations: Not on file  . Attends Archivist Meetings: Not on file  . Marital Status: Not on file  Intimate Partner Violence:   . Fear of Current or Ex-Partner: Not on file  . Emotionally Abused: Not on file  . Physically Abused: Not on file  . Sexually Abused: Not on file    Review of Systems:    Constitutional: No weight loss, fever or chills Skin: No rash Cardiovascular: No chest pain Respiratory: No SOB  Gastrointestinal: See HPI and otherwise negative Genitourinary: No dysuria  Neurological: No headache Musculoskeletal: No new muscle or joint pain Hematologic: No bleeding    Psychiatric: No history of depression or anxiety   Physical Exam:  Vital signs: BP 118/80   Pulse 78   Temp 97.7 F (36.5 C)   Ht 5\' 6"  (1.676 m)   Wt 157 lb (71.2 kg)   BMI 25.34 kg/m   Constitutional:   Pleasant Caucasian male appears to be in NAD, Well developed, Well nourished, alert and cooperative Head:  Normocephalic and atraumatic. Eyes:   PEERL, EOMI. No icterus. Conjunctiva pink. Ears:  Normal auditory acuity. Neck:  Supple Throat: Oral cavity and pharynx without inflammation, swelling or lesion.  Respiratory: Respirations even and unlabored. Lungs clear to auscultation bilaterally.   No wheezes, crackles, or rhonchi.  Cardiovascular: Normal S1, S2. No MRG. Regular rate and rhythm. No peripheral edema, cyanosis or pallor.  Gastrointestinal:  Soft, nondistended, moderate LLQ and suprapubic ttp. No rebound or guarding. Normal bowel sounds. No appreciable masses or hepatomegaly. Rectal:  Not performed.  Msk:  Symmetrical without gross deformities. Without edema, no deformity or joint abnormality.  Neurologic:  Alert and  oriented x4;  grossly normal neurologically.  Skin:   Dry and intact without significant lesions or rashes. Psychiatric:  Demonstrates good judgement and reason without abnormal affect or behaviors.  RELEVANT LABS AND IMAGING: CBC    Component Value Date/Time   WBC 4.9 05/10/2019 1142   RBC 5.41 05/10/2019 1142   HGB 15.5 05/10/2019 1142   HCT 46.9 05/10/2019 1142   PLT 241 05/10/2019 1142   MCV 86.7 05/10/2019 1142   MCH 28.7 05/10/2019 1142   MCHC 33.0 05/10/2019 1142   RDW 11.8 05/10/2019 1142   LYMPHSABS  1.6 09/06/2018 1232   MONOABS 0.6 09/06/2018 1232   EOSABS 0.6 (H) 09/06/2018 1232   BASOSABS 0.1 09/06/2018 1232    CMP     Component Value Date/Time   NA 138 05/10/2019 1142   K 4.2 05/10/2019 1142   CL 107 05/10/2019 1142   CO2 23 05/10/2019 1142   GLUCOSE 118 (H) 05/10/2019 1142   BUN 12 05/10/2019 1142   CREATININE 0.97  05/10/2019 1142   CREATININE 1.04 01/18/2014 1543   CALCIUM 9.2 05/10/2019 1142   PROT 6.9 05/10/2019 1142   ALBUMIN 4.1 05/10/2019 1142   AST 20 05/10/2019 1142   ALT 22 05/10/2019 1142   ALKPHOS 73 05/10/2019 1142   BILITOT 1.0 05/10/2019 1142   GFRNONAA >60 05/10/2019 1142   GFRAA >60 05/10/2019 1142    Assessment: 1.  Lower abdominal pain: Constant and severe per patient, history of IBS which could be making this worse 2.  Change in bowel habits: Towards darker and constipation, Hemoccult testing in the ER was negative, rectal exam at that time with dark brown stool; consider IBS versus other 3.  Family history of colon cancer: In his brother diagnosed at the age of 48 with metastatic colon cancer  Plan: 1.  Scheduled patient for CT of the abdomen and pelvis with contrast for further evaluation of worsening abdominal pain and change in bowel habits with a family history of colon cancer. 2.  Patient will also be scheduled for a colonoscopy.  He requests this be done soon.  Dr. Carlean [name] is in the hospital next week so this was scheduled with Dr. Havery Moros.  Did discuss risks, benefits, limitations and alternatives and the patient agrees to proceed.  He will be Covid tested 2 days prior to time of procedure.  He will follow with Dr. Carlean [name] after procedure as he is his primary GI physician.  We may need to adjust timing of this pending findings of the CT. 3.  Patient was instructed to use MiraLAX at least twice daily, discussed using this up to 4 times a day to achieve a soft solid stool.  He needs to do this consistently. 4.  Discussed use of Dicyclomine.  He could try this for his pain, but he declines. 5.  Patient to follow in clinic per recommendations after CT and colonoscopy.  Ellouise Newer, PA-C [facility] 05/19/2019, 8:46 AM

## 2019-05-20 ENCOUNTER — Telehealth: Payer: Self-pay

## 2019-05-20 ENCOUNTER — Other Ambulatory Visit: Payer: Self-pay

## 2019-05-20 MED ORDER — DICYCLOMINE HCL 10 MG PO CAPS: 20.0000 mg | ORAL_CAPSULE | Freq: Three times a day (TID) | ORAL | 1 refills | Status: DC

## 2019-05-20 NOTE — Telephone Encounter (Signed)
Patient called back and wants to know if he can have something for his lower abdominal pain, while he is waiting to have his colonoscopy next week. Said the pain is so bad, it is hard for him to work

## 2019-05-20 NOTE — Telephone Encounter (Signed)
Can give him Dicyclomine as discussed at visit 20 mg TID, 20-30 min before meals. #45 with one refill.  Thanks-JLL

## 2019-05-20 NOTE — Progress Notes (Signed)
di

## 2019-05-20 NOTE — Telephone Encounter (Signed)
Sent order to patient's pharmacy for Dicyclomiine-20mg  TID before meals. Called and gave instructions for taking

## 2019-05-22 ENCOUNTER — Ambulatory Visit (INDEPENDENT_AMBULATORY_CARE_PROVIDER_SITE_OTHER): Payer: Medicaid Other

## 2019-05-22 ENCOUNTER — Other Ambulatory Visit: Payer: Self-pay | Admitting: Gastroenterology

## 2019-05-22 DIAGNOSIS — Z1159 Encounter for screening for other viral diseases: Secondary | ICD-10-CM

## 2019-05-22 LAB — SARS CORONAVIRUS 2 (TAT 6-24 HRS): SARS Coronavirus 2: NEGATIVE

## 2019-05-22 NOTE — Telephone Encounter (Signed)
Called patient back and he has only been taking the dicyclomine BID, I let him know Ellouise Newer PA wants him to take it TID. States his pain is bad and constant in the left abdomin. Also says he has not had a BM is a while. I asked if his abdomin feels full and firm and he says Yes. I asked him to take MIralax 1-3 doses a day until he gets result of soft formed stool. 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 I asked him to do this over the weekend then, and call us Monday if no improvement

## 2019-05-22 NOTE — Telephone Encounter (Signed)
Patient is calling today states the abdominal pain is getting worse on the left side. He is seeking some antibiotics if possible.

## 2019-05-26 ENCOUNTER — Ambulatory Visit (AMBULATORY_SURGERY_CENTER): Payer: Medicaid Other | Admitting: Gastroenterology

## 2019-05-26 ENCOUNTER — Encounter: Payer: Self-pay | Admitting: Gastroenterology

## 2019-05-26 ENCOUNTER — Other Ambulatory Visit: Payer: Self-pay

## 2019-05-26 VITALS — BP 131/73 | HR 78 | Temp 98.7°F | Resp 17 | Ht 66.0 in | Wt 157.0 lb

## 2019-05-26 DIAGNOSIS — D122 Benign neoplasm of ascending colon: Secondary | ICD-10-CM | POA: Diagnosis not present

## 2019-05-26 DIAGNOSIS — D125 Benign neoplasm of sigmoid colon: Secondary | ICD-10-CM

## 2019-05-26 DIAGNOSIS — K5730 Diverticulosis of large intestine without perforation or abscess without bleeding: Secondary | ICD-10-CM

## 2019-05-26 DIAGNOSIS — R1030 Lower abdominal pain, unspecified: Secondary | ICD-10-CM | POA: Diagnosis not present

## 2019-05-26 DIAGNOSIS — D123 Benign neoplasm of transverse colon: Secondary | ICD-10-CM

## 2019-05-26 DIAGNOSIS — K635 Polyp of colon: Secondary | ICD-10-CM | POA: Insufficient documentation

## 2019-05-26 HISTORY — PX: COLONOSCOPY: SHX174

## 2019-05-26 MED ORDER — SODIUM CHLORIDE 0.9 % IV SOLN: 500.0000 mL | Freq: Once | INTRAVENOUS | Status: DC

## 2019-05-26 NOTE — Progress Notes (Signed)
Report to PACU, RN, vss, BBS= Clear.  

## 2019-05-26 NOTE — Patient Instructions (Signed)
YOU HAD AN ENDOSCOPIC PROCEDURE TODAY AT THE [facility]:   Refer to the procedure report that was given to you for any specific questions about what was found during the examination.  If the procedure report does not answer your questions, please call your gastroenterologist to clarify.  If you requested that your care partner not be given the details of your procedure findings, then the procedure report has been included in a sealed envelope for you to review at your convenience later.  YOU SHOULD EXPECT: Some feelings of bloating in the abdomen. Passage of more gas than usual.  Walking can help get rid of the air that was put into your GI tract during the procedure and reduce the bloating. If you had a lower endoscopy (such as a colonoscopy or flexible sigmoidoscopy) you may notice spotting of blood in your stool or on the toilet paper. If you underwent a bowel prep for your procedure, you may not have a normal bowel movement for a few days.  Please Note:  You might notice some irritation and congestion in your nose or some drainage.  This is from the oxygen used during your procedure.  There is no need for concern and it should clear up in a day or so.  SYMPTOMS TO REPORT IMMEDIATELY:   Following lower endoscopy (colonoscopy or flexible sigmoidoscopy):  Excessive amounts of blood in the stool  Significant tenderness or worsening of abdominal pains  Swelling of the abdomen that is new, acute  Fever of 100F or higher   For urgent or emergent issues, a gastroenterologist can be reached at any hour by calling [phone].   DIET:  We do recommend a small meal at first, but then you may proceed to your regular diet.  Drink plenty of fluids but you should avoid alcoholic beverages for 24 hours.  MEDICATIONS: Continue present medications.  Please see handouts given to you by your recovery nurse.  ACTIVITY:  You should plan to take it easy for the rest of today and you should  NOT DRIVE or use heavy machinery until tomorrow (because of the sedation medicines used during the test).    FOLLOW UP: Our staff will call the number listed on your records 48-72 hours following your procedure to check on you and address any questions or concerns that you may have regarding the information given to you following your procedure. If we do not reach you, we will leave a message.  We will attempt to reach you two times.  During this call, we will ask if you have developed any symptoms of COVID 19. If you develop any symptoms (ie: fever, flu-like symptoms, shortness of breath, cough etc.) before then, please call [phone].  If you test positive for Covid 19 in the 2 weeks post procedure, please call and report this information to us.    If any biopsies were taken you will be contacted by phone or by letter within the next 1-3 weeks.  Please call us at [phone] if you have not heard about the biopsies in 3 weeks.   Thank you for allowing us to provide for your healthcare needs today.   SIGNATURES/CONFIDENTIALITY: You and/or your care partner have signed paperwork which will be entered into your electronic medical record.  These signatures attest to the fact that that the information above on your After Visit Summary has been reviewed and is understood.  Full responsibility of the confidentiality of this discharge information lies with you and/or   your care-partner. 

## 2019-05-26 NOTE — Progress Notes (Signed)
Called to room to assist during endoscopic procedure.  Patient ID and intended procedure confirmed with present staff. Received instructions for my participation in the procedure from the performing physician.  

## 2019-05-26 NOTE — Progress Notes (Signed)
Temp LC  VS DT  Pt's states no medical or surgical changes since previsit or office visit. Reviewed by admitting RN

## 2019-05-26 NOTE — Op Note (Signed)
[facility] Patient Name: Adam [name] Procedure Date: 05/26/2019 10:41 AM MRN: [id number] Endoscopist: Remo Lipps P. Havery Moros , MD Age: 54 Referring MD:  Date of Birth: [birth year] Gender: Male Account #: [id number] Procedure:                Colonoscopy Indications:              Lower abdominal pain, negative CT scan, history of                            constipation, brother with colon cancer diagnosed                            age 78s - patient endorses a chronic burning                            sensation in the LLQ Medicines:                Monitored Anesthesia Care Procedure:                Pre-Anesthesia Assessment:                           - Prior to the procedure, a History and Physical                            was performed, and patient medications and                            allergies were reviewed. The patient's tolerance of                            previous anesthesia was also reviewed. The risks                            and benefits of the procedure and the sedation                            options and risks were discussed with the patient.                            All questions were answered, and informed consent                            was obtained. Prior Anticoagulants: The patient has                            taken no previous anticoagulant or antiplatelet                            agents. ASA Grade Assessment: II - A patient with                            mild systemic disease. After reviewing the risks  and benefits, the patient was deemed in                            satisfactory condition to undergo the procedure.                           After obtaining informed consent, the colonoscope                            was passed under direct vision. Throughout the                            procedure, the patient's blood pressure, pulse, and                            oxygen saturations were monitored  continuously. The                            Colonoscope was introduced through the anus and                            advanced to the the terminal ileum, with                            identification of the appendiceal orifice and IC                            valve. The colonoscopy was performed without                            difficulty. The patient tolerated the procedure                            well. The quality of the bowel preparation was                            good. The terminal ileum, ileocecal valve,                            appendiceal orifice, and rectum were photographed. Scope In: 10:49:24 AM Scope Out: 11:07:02 AM Scope Withdrawal Time: 0 hours 14 minutes 8 seconds  Total Procedure Duration: 0 hours 17 minutes 38 seconds  Findings:                 The perianal and digital rectal examinations were                            normal.                           The terminal ileum appeared normal.                           A 3 mm polyp was found in the ascending colon. The  polyp was sessile. The polyp was removed with a                            cold snare. Resection and retrieval were complete.                           Two sessile polyps were found in the transverse                            colon. The polyps were 3 to 5 mm in size. These                            polyps were removed with a cold snare. Resection                            and retrieval were complete.                           Three sessile polyps were found in the sigmoid                            colon. The polyps were 3 mm in size. These polyps                            were removed with a cold snare. Resection and                            retrieval were complete.                           A few small-mouthed diverticula were found in the                            sigmoid colon.                           The exam was otherwise without  abnormality. Complications:            No immediate complications. Estimated blood loss:                            Minimal. Estimated Blood Loss:     Estimated blood loss was minimal. Impression:               - The examined portion of the ileum was normal.                           - One 3 mm polyp in the ascending colon, removed                            with a cold snare. Resected and retrieved.                           - Two 3 to 5 mm polyps in the  transverse colon,                            removed with a cold snare. Resected and retrieved.                           - Three 3 mm polyps in the sigmoid colon, removed                            with a cold snare. Resected and retrieved.                           - Diverticulosis in the sigmoid colon.                           - The examination was otherwise normal.                           No obvious cause for pain on this exam. Symptoms                            don't seem related to bowel movements /                            constipation as he reports things today.                            Musculoskeletal / neuropathic pain could be                            possible source of the patient's symptoms. Recommendation:           - Patient has a contact number available for                            emergencies. The signs and symptoms of potential                            delayed complications were discussed with the                            patient. Return to normal activities tomorrow.                            Written discharge instructions were provided to the                            patient.                           - Resume previous diet.                           - Continue present medications.                           -  Await pathology results. Remo Lipps P. Dontel Harshberger, MD 05/26/2019 11:13:14 AM This report has been signed electronically.

## 2019-05-28 ENCOUNTER — Telehealth: Payer: Self-pay

## 2019-05-28 NOTE — Telephone Encounter (Signed)
  Follow up Call-  Call back number 05/26/2019  Post procedure Call Back phone  # [phone]  Permission to leave phone message Yes  Some recent data might be hidden     Patient questions:  Do you have a fever, pain , or abdominal swelling? Yes.   Pain Score  6 *  Have you tolerated food without any problems? Yes.    Have you been able to return to your normal activities? No.  Do you have any questions about your discharge instructions: Diet   No. Medications  No. Follow up visit  No.  Do you have questions or concerns about your Care? No. Pt state pain is the same as before. Actions: * If pain score is 4 or above: No action needed, pain <4.   1. Have you developed a fever since your procedure? no  2.   Have you had an respiratory symptoms (SOB or cough) since your procedure? no  3.   Have you tested positive for COVID 19 since your procedure no  4.   Have you had any family members/close contacts diagnosed with the COVID 19 since your procedure?  no   If yes to any of these questions please route to Joylene John, RN and Alphonsa Gin, Therapist, sports.

## 2019-06-06 ENCOUNTER — Ambulatory Visit (HOSPITAL_COMMUNITY)
Admission: EM | Admit: 2019-06-06 | Discharge: 2019-06-06 | Disposition: A | Discharge status: Home / Self Care | Payer: Medicaid Other | Attending: Family Medicine | Admitting: Family Medicine

## 2019-06-06 ENCOUNTER — Other Ambulatory Visit: Payer: Self-pay

## 2019-06-06 ENCOUNTER — Encounter (HOSPITAL_COMMUNITY): Payer: Self-pay

## 2019-06-06 DIAGNOSIS — R3914 Feeling of incomplete bladder emptying: Secondary | ICD-10-CM

## 2019-06-06 DIAGNOSIS — R350 Frequency of micturition: Secondary | ICD-10-CM

## 2019-06-06 DIAGNOSIS — R3911 Hesitancy of micturition: Secondary | ICD-10-CM | POA: Diagnosis not present

## 2019-06-06 DIAGNOSIS — R309 Painful micturition, unspecified: Secondary | ICD-10-CM

## 2019-06-06 LAB — POCT URINALYSIS DIP (DEVICE)
Bilirubin Urine: NEGATIVE
Glucose, UA: NEGATIVE mg/dL
Hgb urine dipstick: NEGATIVE
Ketones, ur: NEGATIVE mg/dL
Leukocytes,Ua: NEGATIVE
Nitrite: NEGATIVE
Protein, ur: NEGATIVE mg/dL
Specific Gravity, Urine: 1.025 (ref 1.005–1.030)
Urobilinogen, UA: 0.2 mg/dL (ref 0.0–1.0)
pH: 7 (ref 5.0–8.0)

## 2019-06-06 MED ORDER — TAMSULOSIN HCL 0.4 MG PO CAPS: 0.4000 mg | ORAL_CAPSULE | Freq: Every day | ORAL | 0 refills | Status: AC

## 2019-06-06 NOTE — Discharge Instructions (Addendum)
Try the tamulosin 1 time a day for your urinary hesistancy  We have no obvious sign of an acute infection in your bladder or prostate at this time  I believe you would benefit from seeing a Urologist, I have supplied the number, please contact that office on Monday to discuss an appointment.  If you develop fever or penile discharge please return to be reevaluated.

## 2019-06-06 NOTE — ED Triage Notes (Signed)
Patient presents to Urgent Care with complaints of lower abdominal pain since about a month ago. Patient reports he has been trying to tell his doctor that he has a prostate infection. Pt states he also went to the ED for the same and they suggested he have a colonoscopy, which he had done (was told he had polyps and diverticulitis). Pt states it hurts when he urinates.

## 2019-06-06 NOTE — ED Provider Notes (Signed)
[facility]    CSN: [id number] Arrival date & time: 06/06/19  1605      History   Chief Complaint Chief Complaint  Patient presents with  . Appointment  . Abdominal Pain    HPI TORIAN RODRIGUE is a 54 y.o. male.   Patient reports to urgent care today for lower abdominal pain, painful urination, urinary frequency, and hesitation. This has been an issue for at least 1 month. He reports pain at times that radiates through his pelvis and into his testicles at times. He reports hesitancy and poor stream at times. He also feels as though he is not empyting his bladder completely. He Denies fever and chills. Denies penile discharge. He has had extensive GI work up to include recent colonoscopy which reveal diverticulosis and polyps but not direct cause of symptoms. He has a remote history of prostatitis in 2008 and believes these symptoms are similar.  Denies fever, chills, nausea, vomiting, diarrhea, painful bowel movements.      Past Medical History:  Diagnosis Date  . Anxiety state, unspecified   . Esophageal reflux    no meds. per pt.  . Family history of stress   . Generalized headaches   . Hiatal hernia    pt. denies nerve/mucscle disease  . Hypertension   . IBS (irritable bowel syndrome)   . Major depressive disorder, single episode, unspecified   . Palpitations   . Panic disorder without agoraphobia   . Prostatitis, unspecified   . RBBB (right bundle branch block)   . Sleep apnea   . Unspecified cardiovascular disease   . Visual disturbance     Patient Active Problem List   Diagnosis Date Noted  . Lipoma of abdominal wall 03/17/2014  . Onychomycosis 01/18/2014  . Pain, dental 12/16/2013  . Cervical strain, acute 12/16/2013  . Pneumonia 11/12/2013  . URI (upper respiratory infection) 08/13/2011  . Cough 08/09/2011  . IBS (irritable bowel syndrome)- Constipation Predominant 06/19/2011  . Headache(784.0) 05/14/2011  . Light-headedness 05/14/2011    . Left ear pain 05/14/2011  . Inguinal hernia, bilateral 03/02/2011  . Bilateral inguinal hernia 02/26/2011  . Dental abscess 02/15/2011  . Shortness of breath 01/30/2011  . Fatigue 12/13/2010  . Abdominal pain 10/20/2010  . Shoulder pain, bilateral 07/31/2010  . Orthopnea 07/31/2010  . BACK PAIN, CHRONIC 06/09/2010  . UNSPECIFIED VISUAL DISTURBANCE 12/12/2009  . UNSPECIFIED CONDITION OF BRAIN 10/19/2009  . ALLERGIC RHINITIS, SEASONAL 08/25/2009  . HYPOKALEMIA, HX OF 12/30/2008  . Essential hypertension, benign 08/04/2007  . LOW BACK PAIN, CHRONIC 08/04/2007  . Mood disorder NOS 05/28/2007  . PALPITATIONS 04/25/2007  . CHEST PAIN 04/18/2007  . PROSTATITIS NOS 08/14/2006  . ANXIETY 07/04/2006  . PANIC ATTACKS 07/04/2006  . GASTROESOPHAGEAL REFLUX, NO ESOPHAGITIS 07/04/2006    Past Surgical History:  Procedure Laterality Date  . APPENDECTOMY    . CHOLECYSTECTOMY    . COLONOSCOPY  07/26/2005   normal  . COLONOSCOPY    . ESOPHAGOGASTRODUODENOSCOPY  01/03/2005   normal  . FRACTURE SURGERY  2000   left arm  . HERNIA REPAIR  03/14/2011   BIH  . INGUINAL HERNIA REPAIR  03/14/2011   Procedure: LAPAROSCOPIC BILATERAL INGUINAL HERNIA REPAIR;  Surgeon: Harl Bowie, MD;  Location: [facility];  Service: General;  Laterality: N/A;  with mesh  . pyloric stenosis     . right arm     fracture--Right arm fracture s/p ORIF from roller blading.  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 Authorizing Provider  metoprolol succinate (TOPROL-XL) 50 MG 24 hr tablet Take 1 tablet (50 mg total) by mouth daily. Take with or immediately following a meal. 06/02/18 06/06/19 Yes Jacqlyn Larsen, PA-C  albuterol (PROVENTIL HFA;VENTOLIN HFA) 108 (90 Base) MCG/ACT inhaler Inhale 2 puffs into the lungs every 4 (four) hours as needed for wheezing or shortness of  breath. Patient not taking: Reported on 05/26/2019 08/02/18   Daleen Bo, MD  albuterol (VENTOLIN HFA) 108 (90 Base) MCG/ACT inhaler Inhale 1-2 puffs into the lungs every 6 (six) hours as needed for wheezing or shortness of breath. Patient not taking: Reported on 05/26/2019 09/06/18   Charlesetta Shanks, MD  ALPRAZolam Duanne Moron) 1 MG tablet Take 0.5 mg by mouth at bedtime.     [provider]  dicyclomine (BENTYL) 10 MG capsule Take 2 capsules (20 mg total) by mouth 4 (four) times daily -  before meals and at bedtime. Take 20-30 minutes before meals Patient not taking: Reported on 05/26/2019 05/20/19   Levin Erp, PA  tamsulosin (FLOMAX) 0.4 MG CAPS capsule Take 1 capsule (0.4 mg total) by mouth daily. 06/06/19 07/06/19  Ledger Heindl, Marguerita Beards, PA-C    Family History Family History  Problem Relation Age of Onset  . Heart attack Mother 8       PTCA  and 3 stents  . Heart disease Mother   . COPD Mother   . Hodgkin's lymphoma Maternal Grandmother   . Cancer Maternal Aunt        pt unaware of what kind  . Cancer Maternal Uncle        pt unaware of what kind  . Pyloric stenosis Daughter 40       X2  . Anxiety disorder Other   . Bipolar disorder Daughter   . Colon cancer Brother   . Esophageal cancer Neg Hx   . Rectal cancer Neg Hx   . Stomach cancer Neg Hx     Social History Social History   Tobacco Use  . Smoking status: Never Smoker  . Smokeless tobacco: Never Used  Substance Use Topics  . Alcohol use: No  . Drug use: No     Allergies   Hctz [hydrochlorothiazide] and Penicillins   Review of Systems Review of Systems  Constitutional: Negative for chills and fever.  HENT: Negative for congestion, ear pain, rhinorrhea, sinus pressure, sinus pain and sore throat.   Eyes: Negative for pain and visual disturbance.  Respiratory: Negative for cough and shortness of breath.   Cardiovascular: Negative for chest pain and palpitations.  Gastrointestinal: Negative for  abdominal pain and vomiting.  Genitourinary: Positive for difficulty urinating, dysuria, frequency, testicular pain and urgency. Negative for decreased urine volume, discharge, flank pain, hematuria, penile pain, penile swelling and scrotal swelling.  Musculoskeletal: Negative for arthralgias, back pain and myalgias.  Skin: Negative for color change and rash.  Neurological: Negative for seizures, syncope and headaches.  All other systems reviewed and are negative.    Physical Exam Triage Vital Signs ED Triage Vitals  Enc Vitals Group     BP 06/06/19 1617 (!) 159/81     Pulse Rate 06/06/19 1617 60     Resp 06/06/19 1617 17     Temp 06/06/19 1617 98.2 F (36.8 C)     Temp Source 06/06/19 1617  Oral     SpO2 06/06/19 1617 100 %     Weight --      Height --      Head Circumference --      Peak Flow --      Pain Score 06/06/19 1616 8     Pain Loc --      Pain Edu? --      Excl. in [place]? --    No data found.  Updated Vital Signs BP (!) 159/81 (BP Location: Right Arm)   Pulse 60   Temp 98.2 F (36.8 C) (Oral)   Resp 17   SpO2 100%   Visual Acuity Right Eye Distance:   Left Eye Distance:   Bilateral Distance:    Right Eye Near:   Left Eye Near:    Bilateral Near:     Physical Exam Vitals and nursing note reviewed.  Constitutional:      General: He is not in acute distress.    Appearance: He is well-developed and normal weight. He is not ill-appearing.  HENT:     Head: Normocephalic and atraumatic.  Eyes:     Conjunctiva/sclera: Conjunctivae normal.  Cardiovascular:     Rate and Rhythm: Normal rate and regular rhythm.     Heart sounds: No murmur.  Pulmonary:     Effort: Pulmonary effort is normal. No respiratory distress.     Breath sounds: Normal breath sounds.  Abdominal:     Palpations: Abdomen is soft.     Tenderness: There is no abdominal tenderness.     Hernia: No hernia is present.  Musculoskeletal:     Cervical back: Neck supple.  Skin:    General:  Skin is warm and dry.     Capillary Refill: Capillary refill takes less than 2 seconds.  Neurological:     General: No focal deficit present.     Mental Status: He is alert and oriented to person, place, and time.  Psychiatric:        Mood and Affect: Mood normal.        Behavior: Behavior normal.      UC Treatments / Results  Labs (all labs ordered are listed, but only abnormal results are displayed) Labs Reviewed  POCT URINALYSIS DIP (DEVICE)    EKG   Radiology No results found.  Procedures Procedures (including critical care time)  Medications Ordered in UC Medications - No data to display  Initial Impression / Assessment and Plan / UC Course  I have reviewed the triage vital signs and the nursing notes.  Pertinent labs & imaging results that were available during my care of the patient were reviewed by me and considered in my medical decision making (see chart for details).     #Lower Urinary Symptoms - UA negative. Similar complaints over the last month. No acute signs of infection. BPH vs chronic prostatitis vs other urologic abnormality. Tamulosin for symptoms and urology office information given. Discussed return precautions.    Final Clinical Impressions(s) / UC Diagnoses   Final diagnoses:  Painful urination  Urinary frequency  Feeling of incomplete bladder emptying  Urinary hesitancy     Discharge Instructions     Try the tamulosin 1 time a day for your urinary hesistancy  We have no obvious sign of an acute infection in your bladder or prostate at this time  I believe you would benefit from seeing a Urologist, I have supplied the number, please contact that office on Monday to discuss an  appointment.  If you develop fever or penile discharge please return to be reevaluated.       ED Prescriptions    Medication Sig Dispense Auth. Provider   tamsulosin (FLOMAX) 0.4 MG CAPS capsule Take 1 capsule (0.4 mg total) by mouth daily. 30 capsule  Marye Eagen, Marguerita Beards, PA-C     PDMP not reviewed this encounter.   Purnell Shoemaker, PA-C 06/07/19 [id number]

## 2019-08-02 ENCOUNTER — Encounter (HOSPITAL_COMMUNITY): Payer: Self-pay | Admitting: Emergency Medicine

## 2019-08-02 ENCOUNTER — Other Ambulatory Visit: Payer: Self-pay

## 2019-08-02 ENCOUNTER — Ambulatory Visit (HOSPITAL_COMMUNITY)
Admission: EM | Admit: 2019-08-02 | Discharge: 2019-08-02 | Disposition: A | Discharge status: Home / Self Care | Payer: Medicaid Other | Attending: Family Medicine | Admitting: Family Medicine

## 2019-08-02 DIAGNOSIS — K047 Periapical abscess without sinus: Secondary | ICD-10-CM

## 2019-08-02 MED ORDER — AMOXICILLIN-POT CLAVULANATE 875-125 MG PO TABS: 1.0000 | ORAL_TABLET | Freq: Two times a day (BID) | ORAL | 0 refills | Status: DC

## 2019-08-02 MED ORDER — NAPROXEN 500 MG PO TABS: 500.0000 mg | ORAL_TABLET | Freq: Two times a day (BID) | ORAL | 0 refills | Status: DC

## 2019-08-02 NOTE — ED Provider Notes (Signed)
[place]   MRN: [id number] DOB: 12/15/1965  Subjective:   Adam [name] is a 54 y.o. male presenting for 4 day hx of acute onset recurrent left lower sided dental pain that radiates to his face and ear, down his jaw into his neck.  Patient has been using his wife's amoxicillin of 500 mg from 2 years ago.  He states it is not helping.  He did make an appointment with a dental surgeon, has this on April 6 but wants treatment now.  Denies fever, difficulty controlling secretions, inability to swallow or difficulty breathing.  Patient denies history of allergies to penicillin.   Current Facility-Administered Medications:  .  0.9 %  sodium chloride infusion, 500 mL, Intravenous, Once, Armbruster, Adam Raspberry, MD  Current Outpatient Medications:  .  albuterol (PROVENTIL HFA;VENTOLIN HFA) 108 (90 Base) MCG/ACT inhaler, Inhale 2 puffs into the lungs every 4 (four) hours as needed for wheezing or shortness of breath. (Patient not taking: Reported on 05/26/2019), Disp: 1 Inhaler, Rfl: 1 .  albuterol (VENTOLIN HFA) 108 (90 Base) MCG/ACT inhaler, Inhale 1-2 puffs into the lungs every 6 (six) hours as needed for wheezing or shortness of breath. (Patient not taking: Reported on 05/26/2019), Disp: 1 Inhaler, Rfl: 2 .  ALPRAZolam (XANAX) 1 MG tablet, Take 0.5 mg by mouth at bedtime. , Disp: , Rfl:  .  dicyclomine (BENTYL) 10 MG capsule, Take 2 capsules (20 mg total) by mouth 4 (four) times daily -  before meals and at bedtime. Take 20-30 minutes before meals (Patient not taking: Reported on 05/26/2019), Disp: 45 capsule, Rfl: 1 .  metoprolol succinate (TOPROL-XL) 50 MG 24 hr tablet, Take 1 tablet (50 mg total) by mouth daily. Take with or immediately following a meal., Disp: 90 tablet, Rfl: 0   Allergies  Allergen Reactions  . Hctz [Hydrochlorothiazide] Other (See Comments)    Per wife, pt developed joint pain and swelling (esp shoulders and elbows)    . Penicillins Other (See Comments)    Pt  has family history of allergy but does not remember ever taking this    Past Medical History:  Diagnosis Date  . Anxiety state, unspecified   . Esophageal reflux    no meds. per pt.  . Family history of stress   . Generalized headaches   . Hiatal hernia    pt. denies nerve/mucscle disease  . Hypertension   . IBS (irritable bowel syndrome)   . Major depressive disorder, single episode, unspecified   . Palpitations   . Panic disorder without agoraphobia   . Prostatitis, unspecified   . RBBB (right bundle branch block)   . Sleep apnea   . Unspecified cardiovascular disease   . Visual disturbance      Past Surgical History:  Procedure Laterality Date  . APPENDECTOMY    . CHOLECYSTECTOMY    . COLONOSCOPY  07/26/2005   normal  . COLONOSCOPY    . ESOPHAGOGASTRODUODENOSCOPY  01/03/2005   normal  . FRACTURE SURGERY  2000   left arm  . HERNIA REPAIR  03/14/2011   BIH  . INGUINAL HERNIA REPAIR  03/14/2011   Procedure: LAPAROSCOPIC BILATERAL INGUINAL HERNIA REPAIR;  Surgeon: Harl Bowie, MD;  Location: [facility];  Service: General;  Laterality: N/A;  with mesh  . pyloric stenosis     . right arm     fracture--Right arm fracture s/p ORIF from roller blading.  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 Heart attack Mother 96       PTCA  and 3 stents  . Heart disease Mother   . COPD Mother   . Hodgkin's lymphoma Maternal Grandmother   . Cancer Maternal Aunt        pt unaware of what kind  . Cancer Maternal Uncle        pt unaware of what kind  . Pyloric stenosis Daughter 6       X2  . Anxiety disorder Other   . Bipolar disorder Daughter   . Colon cancer Brother   . Esophageal cancer Neg Hx   . Rectal cancer Neg Hx   . Stomach cancer Neg Hx     Social History   Tobacco Use  . Smoking status: Never Smoker  . Smokeless tobacco: Never Used  Substance Use Topics  . Alcohol use:  No  . Drug use: No    ROS   Objective:   Vitals: BP (!) 163/94 (BP Location: Right Arm)   Pulse 80   Temp 98.1 F (36.7 C) (Oral)   Resp 18   SpO2 98%   Physical Exam Constitutional:      General: He is not in acute distress.    Appearance: Normal appearance. He is well-developed and normal weight. He is not ill-appearing, toxic-appearing or diaphoretic.  HENT:     Head: Normocephalic and atraumatic.     Right Ear: External ear normal.     Left Ear: External ear normal.     Nose: Nose normal.     Mouth/Throat:     Pharynx: Oropharynx is clear.   Eyes:     General: No scleral icterus.       Right eye: No discharge.        Left eye: No discharge.     Extraocular Movements: Extraocular movements intact.     Pupils: Pupils are equal, round, and reactive to light.  Cardiovascular:     Rate and Rhythm: Normal rate.  Pulmonary:     Effort: Pulmonary effort is normal.  Musculoskeletal:     Cervical back: Normal range of motion.  Neurological:     Mental Status: He is alert and oriented to person, place, and time.  Psychiatric:        Mood and Affect: Mood normal.        Behavior: Behavior normal.        Thought Content: Thought content normal.        Judgment: Judgment normal.      Assessment and Plan :   1. Dental abscess   2. Dental infection     Start Augmentin for dental abscess, offered tramadol but patient refused.  Use naproxen for pain and inflammation. Emphasized need for dental surgeon consult. Counseled patient on potential for adverse effects with medications prescribed/recommended today, strict ER and return-to-clinic precautions discussed, patient verbalized understanding.    Jaynee Eagles, PA-C 08/02/19 1029

## 2019-08-02 NOTE — Discharge Instructions (Addendum)
[facility] [phone] extension [phone]  Dr. Civils [phone] [address]  [facility] [phone] [address]  [facility] [phone] extension [phone] [place], , [phone] First come first serve for the first 10 clients.  May do simple extractions only, no wisdom teeth or surgery.  You may try the second for Thursday of the month starting at 6:30 AM.  [employer] You may call the school to see if they are still helping to provide dental care for emergent cases.  

## 2019-08-02 NOTE — ED Triage Notes (Signed)
Pt sts left sided dental pain x 4 days

## 2019-12-06 ENCOUNTER — Emergency Department (HOSPITAL_COMMUNITY): Payer: Medicaid Other

## 2019-12-06 ENCOUNTER — Encounter (HOSPITAL_COMMUNITY): Payer: Self-pay | Admitting: Emergency Medicine

## 2019-12-06 ENCOUNTER — Emergency Department (HOSPITAL_COMMUNITY)
Admission: EM | Admit: 2019-12-06 | Discharge: 2019-12-06 | Disposition: A | Discharge status: Home / Self Care | Payer: Medicaid Other | Attending: Emergency Medicine | Admitting: Emergency Medicine

## 2019-12-06 ENCOUNTER — Other Ambulatory Visit: Payer: Self-pay

## 2019-12-06 DIAGNOSIS — R2681 Unsteadiness on feet: Secondary | ICD-10-CM | POA: Insufficient documentation

## 2019-12-06 DIAGNOSIS — Z79899 Other long term (current) drug therapy: Secondary | ICD-10-CM | POA: Diagnosis not present

## 2019-12-06 DIAGNOSIS — S90121A Contusion of right lesser toe(s) without damage to nail, initial encounter: Secondary | ICD-10-CM

## 2019-12-06 DIAGNOSIS — M79671 Pain in right foot: Secondary | ICD-10-CM | POA: Diagnosis not present

## 2019-12-06 DIAGNOSIS — I10 Essential (primary) hypertension: Secondary | ICD-10-CM | POA: Diagnosis not present

## 2019-12-06 DIAGNOSIS — F410 Panic disorder [episodic paroxysmal anxiety] without agoraphobia: Secondary | ICD-10-CM | POA: Diagnosis not present

## 2019-12-06 DIAGNOSIS — M545 Low back pain: Secondary | ICD-10-CM | POA: Insufficient documentation

## 2019-12-06 DIAGNOSIS — M5441 Lumbago with sciatica, right side: Secondary | ICD-10-CM

## 2019-12-06 MED ORDER — ACETAMINOPHEN 500 MG PO TABS
1000.0000 mg | ORAL_TABLET | Freq: Once | ORAL | Status: AC
  Administered 2019-12-06: 1000 mg via ORAL
  Filled 2019-12-06: qty 2

## 2019-12-06 MED ORDER — METHOCARBAMOL 750 MG PO TABS
750.0000 mg | ORAL_TABLET | Freq: Three times a day (TID) | ORAL | 0 refills | Status: DC | PRN
Start: 2019-12-06 — End: 2020-06-26

## 2019-12-06 MED ORDER — PREDNISONE 20 MG PO TABS
ORAL_TABLET | ORAL | 0 refills | Status: DC
Start: 2019-12-07 — End: 2020-04-23

## 2019-12-06 MED ORDER — PREDNISONE 50 MG PO TABS
60.0000 mg | ORAL_TABLET | Freq: Once | ORAL | Status: AC
  Administered 2019-12-06: 60 mg via ORAL
  Filled 2019-12-06: qty 1

## 2019-12-06 NOTE — ED Triage Notes (Signed)
Pt reports was carrying a prebuilt wall last weekend and twisted r ankle and dropped the wall on his foot.  Pt says has history of sciatica and says it has flared since hurting his ankle.  Reports pain in r lower back radiating down r buttocks and r leg.

## 2019-12-06 NOTE — ED Provider Notes (Signed)
[facility] EMERGENCY DEPARTMENT Provider Note   CSN: [id number] Arrival date & time: 12/06/19  0539     History Chief Complaint  Patient presents with  . Foot Pain    Adam [name] is a 54 y.o. male.  Patient c/o pain to dorsum right foot and pain to low back that occasionally radiates down posterior right leg. States right foot pain x 1 week, states accidentally dropped part of a wall/framing on it at work. Pain constant, dull, moderate, worse w palpation. Also states hx low back pain intermittently for years, and feels that has flared up in the past several days, low back, dull, radiating to right leg, moderate. Denies specific back injury or strain, but indicates work involves bending and lifting. Denies saddle area numbness. No urinary retention, or incontinence. No trouble controlling bms. No fever or chills. No leg numbness/weakness.   The history is provided by the patient.  Foot Pain Pertinent negatives include no chest pain, no abdominal pain and no shortness of breath.       Past Medical History:  Diagnosis Date  . Anxiety state, unspecified   . Esophageal reflux    no meds. per pt.  . Family history of stress   . Generalized headaches   . Hiatal hernia    pt. denies nerve/mucscle disease  . Hypertension   . IBS (irritable bowel syndrome)   . Major depressive disorder, single episode, unspecified   . Palpitations   . Panic disorder without agoraphobia   . Prostatitis, unspecified   . RBBB (right bundle branch block)   . Sleep apnea   . Unspecified cardiovascular disease   . Visual disturbance     Patient Active Problem List   Diagnosis Date Noted  . Lipoma of abdominal wall 03/17/2014  . Onychomycosis 01/18/2014  . Pain, dental 12/16/2013  . Cervical strain, acute 12/16/2013  . Pneumonia 11/12/2013  . URI (upper respiratory infection) 08/13/2011  . Cough 08/09/2011  . IBS (irritable bowel syndrome)- Constipation Predominant 06/19/2011  .  Headache(784.0) 05/14/2011  . Light-headedness 05/14/2011  . Left ear pain 05/14/2011  . Inguinal hernia, bilateral 03/02/2011  . Bilateral inguinal hernia 02/26/2011  . Dental abscess 02/15/2011  . Shortness of breath 01/30/2011  . Fatigue 12/13/2010  . Abdominal pain 10/20/2010  . Shoulder pain, bilateral 07/31/2010  . Orthopnea 07/31/2010  . BACK PAIN, CHRONIC 06/09/2010  . UNSPECIFIED VISUAL DISTURBANCE 12/12/2009  . UNSPECIFIED CONDITION OF BRAIN 10/19/2009  . ALLERGIC RHINITIS, SEASONAL 08/25/2009  . HYPOKALEMIA, HX OF 12/30/2008  . Essential hypertension, benign 08/04/2007  . LOW BACK PAIN, CHRONIC 08/04/2007  . Mood disorder NOS 05/28/2007  . PALPITATIONS 04/25/2007  . CHEST PAIN 04/18/2007  . PROSTATITIS NOS 08/14/2006  . ANXIETY 07/04/2006  . PANIC ATTACKS 07/04/2006  . GASTROESOPHAGEAL REFLUX, NO ESOPHAGITIS 07/04/2006    Past Surgical History:  Procedure Laterality Date  . APPENDECTOMY    . CHOLECYSTECTOMY    . COLONOSCOPY  07/26/2005   normal  . COLONOSCOPY    . ESOPHAGOGASTRODUODENOSCOPY  01/03/2005   normal  . FRACTURE SURGERY  2000   left arm  . HERNIA REPAIR  03/14/2011   BIH  . INGUINAL HERNIA REPAIR  03/14/2011   Procedure: LAPAROSCOPIC BILATERAL INGUINAL HERNIA REPAIR;  Surgeon: Harl Bowie, MD;  Location: [facility];  Service: General;  Laterality: N/A;  with mesh  . pyloric stenosis     . right arm     fracture--Right arm fracture s/p ORIF from roller blading.  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 Heart attack Mother 7       PTCA  and 3 stents  . Heart disease Mother   . COPD Mother   . Hodgkin's lymphoma Maternal Grandmother   . Cancer Maternal Aunt        pt unaware of what kind  . Cancer Maternal Uncle        pt unaware of what kind  . Pyloric stenosis Daughter 80       X2  . Anxiety disorder Other   . Bipolar disorder Daughter   .  Colon cancer Brother   . Esophageal cancer Neg Hx   . Rectal cancer Neg Hx   . Stomach cancer Neg Hx     Social History   Tobacco Use  . Smoking status: Never Smoker  . Smokeless tobacco: Never Used  Vaping Use  . Vaping Use: Never used  Substance Use Topics  . Alcohol use: No  . Drug use: No    Home Medications Prior to Admission medications   Medication Sig Start Date End Date Taking? Authorizing Provider  albuterol (PROVENTIL HFA;VENTOLIN HFA) 108 (90 Base) MCG/ACT inhaler Inhale 2 puffs into the lungs every 4 (four) hours as needed for wheezing or shortness of breath. Patient not taking: Reported on 05/26/2019 08/02/18   Daleen Bo, MD  albuterol (VENTOLIN HFA) 108 (90 Base) MCG/ACT inhaler Inhale 1-2 puffs into the lungs every 6 (six) hours as needed for wheezing or shortness of breath. Patient not taking: Reported on 05/26/2019 09/06/18   Charlesetta Shanks, MD  ALPRAZolam Duanne Moron) 1 MG tablet Take 0.5 mg by mouth at bedtime.     [provider]  amoxicillin-clavulanate (AUGMENTIN) 875-125 MG tablet Take 1 tablet by mouth every 12 (twelve) hours. 08/02/19   Jaynee Eagles, PA-C  dicyclomine (BENTYL) 10 MG capsule Take 2 capsules (20 mg total) by mouth 4 (four) times daily -  before meals and at bedtime. Take 20-30 minutes before meals Patient not taking: Reported on 05/26/2019 05/20/19   Levin Erp, PA  metoprolol succinate (TOPROL-XL) 50 MG 24 hr tablet Take 1 tablet (50 mg total) by mouth daily. Take with or immediately following a meal. 06/02/18 06/06/19  Jacqlyn Larsen, PA-C  naproxen (NAPROSYN) 500 MG tablet Take 1 tablet (500 mg total) by mouth 2 (two) times daily. 08/02/19   Jaynee Eagles, PA-C    Allergies    Hctz [hydrochlorothiazide] and Penicillins  Review of Systems   Review of Systems  Constitutional: Negative for fever.  HENT: Negative for sore throat.   Eyes: Negative for redness.  Respiratory: Negative for shortness of breath.   Cardiovascular:  Negative for chest pain.  Gastrointestinal: Negative for abdominal pain and vomiting.  Genitourinary: Negative for flank pain.  Musculoskeletal: Positive for back pain. Negative for neck pain.  Skin: Negative for wound.  Neurological: Negative for weakness and numbness.  Hematological: Does not bruise/bleed easily.  Psychiatric/Behavioral: Negative for confusion.    Physical Exam Updated Vital Signs Ht 1.676 m (5\' 6" )   Wt 71.2 kg   BMI 25.34 kg/m   Physical Exam Vitals and nursing note reviewed.  Constitutional:      Appearance: Normal appearance. He is well-developed.  HENT:     Head: Atraumatic.     Nose: Nose normal.     Mouth/Throat:  Mouth: Mucous membranes are moist.  Eyes:     General: No scleral icterus.    Conjunctiva/sclera: Conjunctivae normal.  Neck:     Trachea: No tracheal deviation.  Cardiovascular:     Rate and Rhythm: Normal rate.     Pulses: Normal pulses.  Pulmonary:     Effort: Pulmonary effort is normal. No accessory muscle usage or respiratory distress.  Abdominal:     General: There is no distension.     Palpations: Abdomen is soft.     Tenderness: There is no abdominal tenderness.  Genitourinary:    Comments: No cva tenderness. Musculoskeletal:        General: No swelling.     Cervical back: Neck supple.     Comments: Lumbar tenderness, otherwise, CTLS spine, non tender, aligned, no step off. Tenderness dorsum right foot. No focal 5th mt pain/tenderness. No malleolar tenderness. Dp/pt 2+. Normal cap refill in toes. Skin is intact, without sign of infection, no bruising.   Skin:    General: Skin is warm and dry.     Findings: No rash.  Neurological:     Mental Status: He is alert.     Comments: Alert, speech clear. Motor/sens grossly intact bil. RLE motor 5/5. Sens intact. Reflexes 2+. Straight leg raise neg. Steady gait.   Psychiatric:        Mood and Affect: Mood normal.     ED Results / Procedures / Treatments   Labs (all labs  ordered are listed, but only abnormal results are displayed) Labs Reviewed - No data to display  EKG None  Radiology DG Lumbar Spine Complete  Result Date: 12/06/2019 CLINICAL DATA:  Back pain after injury last week. RIGHT lower back pain radiated down RIGHT buttocks and RIGHT leg. EXAM: LUMBAR SPINE - COMPLETE 4+ VIEW COMPARISON:  None. FINDINGS: Osseous alignment is within normal limits. No fracture line or displaced fracture fragment. No compression fracture deformity. Mild disc space narrowing at the lumbosacral junction. Remainder of the disc spaces are well maintained in height. Visualized paravertebral soft tissues are unremarkable. IMPRESSION: 1. No acute findings. No osseous fracture or dislocation. 2. Mild degenerative change at the lumbosacral junction. Electronically Signed   By: Franki Cabot M.D.   On: 12/06/2019 11:11   DG Foot Complete Right  Result Date: 12/06/2019 CLINICAL DATA:  Proximal foot pain after dropping a object on the foot last weekend. EXAM: RIGHT FOOT COMPLETE - 3+ VIEW COMPARISON:  None. FINDINGS: No fracture or bone lesion. Joints are normally spaced and aligned.  No arthropathic changes. Normal soft tissues. IMPRESSION: Negative. Electronically Signed   By: Lajean Manes M.D.   On: 12/06/2019 10:37    Procedures Procedures (including critical care time)  Medications Ordered in ED Medications  predniSONE (DELTASONE) tablet 60 mg (has no administration in time range)  acetaminophen (TYLENOL) tablet 1,000 mg (has no administration in time range)    ED Course  I have reviewed the triage vital signs and the nursing notes.  Pertinent labs & imaging results that were available during my care of the patient were reviewed by me and considered in my medical decision making (see chart for details).    MDM Rules/Calculators/A&P                          Imaging studies ordered.   Reviewed nursing notes and prior charts for additional history.   Prednisone po.  Acetaminophen po.   Xrays reviewed/interpreted by me -  no fx.  Discussed xrays w pt.   RX prednisone.  Rec pcp f/u.  Return precautions provided.    Final Clinical Impression(s) / ED Diagnoses Final diagnoses:  None    Rx / DC Orders ED Discharge Orders    None       Lajean Saver, MD 12/06/19 1125

## 2019-12-06 NOTE — Discharge Instructions (Signed)
It was our pleasure to provide your ER care today - we hope that you feel better.  Take prednisone as prescribed. Take acetaminophen as need for pain. You may also take robaxin as need for muscle pain/spasm - no driving when taking.  Avoid bending at waist, or heavy lifting > 20 lbs for the next week.   Follow up with primary care doctor in 1-2 weeks - discuss possible advanced imaging and/or referral to back specialist if pain/sciatica persists.   Return to ER if worse, new symptoms, fevers, severe or intractable pain, numbness/weakness, problems with bowel or bladder function, or other concern.

## 2020-04-23 ENCOUNTER — Other Ambulatory Visit: Payer: Self-pay

## 2020-04-23 ENCOUNTER — Ambulatory Visit (HOSPITAL_COMMUNITY)
Admission: EM | Admit: 2020-04-23 | Discharge: 2020-04-23 | Disposition: A | Discharge status: Home / Self Care | Payer: Medicaid Other | Attending: Emergency Medicine | Admitting: Emergency Medicine

## 2020-04-23 ENCOUNTER — Other Ambulatory Visit: Payer: Medicaid Other

## 2020-04-23 ENCOUNTER — Encounter (HOSPITAL_COMMUNITY): Payer: Self-pay | Admitting: *Deleted

## 2020-04-23 DIAGNOSIS — J45901 Unspecified asthma with (acute) exacerbation: Secondary | ICD-10-CM | POA: Diagnosis not present

## 2020-04-23 DIAGNOSIS — Z20822 Contact with and (suspected) exposure to covid-19: Secondary | ICD-10-CM

## 2020-04-23 MED ORDER — ALBUTEROL SULFATE HFA 108 (90 BASE) MCG/ACT IN AERS
2.0000 | INHALATION_SPRAY | RESPIRATORY_TRACT | 0 refills | Status: DC | PRN
Start: 2020-04-23 — End: 2020-06-26

## 2020-04-23 MED ORDER — PREDNISONE 10 MG PO TABS
40.0000 mg | ORAL_TABLET | Freq: Every day | ORAL | 0 refills | Status: AC
Start: 2020-04-23 — End: 2020-04-28

## 2020-04-23 NOTE — ED Provider Notes (Signed)
[facility]    CSN: [id number] Arrival date & time: 04/23/20  1037      History   Chief Complaint Chief Complaint  Patient presents with  . Shortness of Breath  . Cough    HPI Adam [name] is a 54 y.o. male.   Patient presents with wheezing, cough, and shortness of breath x2 weeks.  He reports history of asthma and states he is out of his albuterol inhaler.  He had a COVID test this morning done elsewhere and the results are pending.  He denies fever, chills, chest pain, abdominal pain, or other symptoms.  His medical history includes hypertension, right bundle branch block, sleep apnea, IBS, chronic back pain, depression, mood disorder, anxiety, panic attacks.  The history is provided by the patient and medical records.    Past Medical History:  Diagnosis Date  . Anxiety state, unspecified   . Esophageal reflux    no meds. per pt.  . Family history of stress   . Generalized headaches   . Hiatal hernia    pt. denies nerve/mucscle disease  . Hypertension   . IBS (irritable bowel syndrome)   . Major depressive disorder, single episode, unspecified   . Palpitations   . Panic disorder without agoraphobia   . Prostatitis, unspecified   . RBBB (right bundle branch block)   . Sleep apnea   . Unspecified cardiovascular disease   . Visual disturbance     Patient Active Problem List   Diagnosis Date Noted  . Lipoma of abdominal wall 03/17/2014  . Onychomycosis 01/18/2014  . Pain, dental 12/16/2013  . Cervical strain, acute 12/16/2013  . Pneumonia 11/12/2013  . URI (upper respiratory infection) 08/13/2011  . Cough 08/09/2011  . IBS (irritable bowel syndrome)- Constipation Predominant 06/19/2011  . Headache(784.0) 05/14/2011  . Light-headedness 05/14/2011  . Left ear pain 05/14/2011  . Inguinal hernia, bilateral 03/02/2011  . Bilateral inguinal hernia 02/26/2011  . Dental abscess 02/15/2011  . Shortness of breath 01/30/2011  . Fatigue 12/13/2010  .  Abdominal pain 10/20/2010  . Shoulder pain, bilateral 07/31/2010  . Orthopnea 07/31/2010  . BACK PAIN, CHRONIC 06/09/2010  . UNSPECIFIED VISUAL DISTURBANCE 12/12/2009  . UNSPECIFIED CONDITION OF BRAIN 10/19/2009  . ALLERGIC RHINITIS, SEASONAL 08/25/2009  . HYPOKALEMIA, HX OF 12/30/2008  . Essential hypertension, benign 08/04/2007  . LOW BACK PAIN, CHRONIC 08/04/2007  . Mood disorder NOS 05/28/2007  . PALPITATIONS 04/25/2007  . CHEST PAIN 04/18/2007  . PROSTATITIS NOS 08/14/2006  . ANXIETY 07/04/2006  . PANIC ATTACKS 07/04/2006  . GASTROESOPHAGEAL REFLUX, NO ESOPHAGITIS 07/04/2006    Past Surgical History:  Procedure Laterality Date  . APPENDECTOMY    . CHOLECYSTECTOMY    . COLONOSCOPY  07/26/2005   normal  . COLONOSCOPY    . ESOPHAGOGASTRODUODENOSCOPY  01/03/2005   normal  . FRACTURE SURGERY  2000   left arm  . HERNIA REPAIR  03/14/2011   BIH  . INGUINAL HERNIA REPAIR  03/14/2011   Procedure: LAPAROSCOPIC BILATERAL INGUINAL HERNIA REPAIR;  Surgeon: Harl Bowie, MD;  Location: [facility];  Service: General;  Laterality: N/A;  with mesh  . pyloric stenosis     . right arm     fracture--Right arm fracture s/p ORIF from roller blading.  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  Authorizing Provider  ALPRAZolam Duanne Moron) 1 MG tablet Take 0.5 mg by mouth at bedtime.   Yes [provider]  albuterol (VENTOLIN HFA) 108 (90 Base) MCG/ACT inhaler Inhale 2 puffs into the lungs every 4 (four) hours as needed for wheezing or shortness of breath. 04/23/20   Sharion Balloon, NP  amoxicillin-clavulanate (AUGMENTIN) 875-125 MG tablet Take 1 tablet by mouth every 12 (twelve) hours. 08/02/19   Jaynee Eagles, PA-C  dicyclomine (BENTYL) 10 MG capsule Take 2 capsules (20 mg total) by mouth 4 (four) times daily -  before meals and at bedtime. Take  20-30 minutes before meals Patient not taking: Reported on 05/26/2019 05/20/19   Levin Erp, PA  methocarbamol (ROBAXIN) 750 MG tablet Take 1 tablet (750 mg total) by mouth 3 (three) times daily as needed (muscle spasm/pain). 12/06/19   Lajean Saver, MD  metoprolol succinate (TOPROL-XL) 50 MG 24 hr tablet Take 1 tablet (50 mg total) by mouth daily. Take with or immediately following a meal. 06/02/18 06/06/19  Jacqlyn Larsen, PA-C  naproxen (NAPROSYN) 500 MG tablet Take 1 tablet (500 mg total) by mouth 2 (two) times daily. 08/02/19   Jaynee Eagles, PA-C  predniSONE (DELTASONE) 10 MG tablet Take 4 tablets (40 mg total) by mouth daily for 5 days. 04/23/20 04/28/20  Sharion Balloon, NP    Family History Family History  Problem Relation Age of Onset  . Heart attack Mother 54       PTCA  and 3 stents  . Heart disease Mother   . COPD Mother   . Hodgkin's lymphoma Maternal Grandmother   . Cancer Maternal Aunt        pt unaware of what kind  . Cancer Maternal Uncle        pt unaware of what kind  . Pyloric stenosis Daughter 37       X2  . Anxiety disorder Other   . Bipolar disorder Daughter   . Colon cancer Brother   . Esophageal cancer Neg Hx   . Rectal cancer Neg Hx   . Stomach cancer Neg Hx     Social History Social History   Tobacco Use  . Smoking status: Never Smoker  . Smokeless tobacco: Never Used  Vaping Use  . Vaping Use: Never used  Substance Use Topics  . Alcohol use: No  . Drug use: No     Allergies   Hctz [hydrochlorothiazide] and Penicillins   Review of Systems Review of Systems  Constitutional: Negative for chills and fever.  HENT: Negative for ear pain and sore throat.   Eyes: Negative for pain and visual disturbance.  Respiratory: Positive for cough, shortness of breath and wheezing.   Cardiovascular: Negative for chest pain and palpitations.  Gastrointestinal: Negative for abdominal pain, diarrhea and vomiting.  Genitourinary: Negative for dysuria  and hematuria.  Musculoskeletal: Negative for arthralgias and back pain.  Skin: Negative for color change and rash.  Neurological: Negative for seizures and syncope.  All other systems reviewed and are negative.    Physical Exam Triage Vital Signs ED Triage Vitals  Enc Vitals Group     BP 04/23/20 1116 (!) 157/84     Pulse Rate 04/23/20 1116 60     Resp 04/23/20 1116 20     Temp 04/23/20 1116 97.8 F (36.6 C)     Temp Source 04/23/20 1116 Oral     SpO2 04/23/20 1116 96 %     Weight 04/23/20 1118 155 lb (70.3  kg)     Height 04/23/20 1118 5\' 6"  (1.676 m)     Head Circumference --      Peak Flow --      Pain Score 04/23/20 1118 0     Pain Loc --      Pain Edu? --      Excl. in [place]? --    No data found.  Updated Vital Signs BP (!) 157/84 (BP Location: Right Arm)   Pulse 60   Temp 97.8 F (36.6 C) (Oral)   Resp 20   Ht 5\' 6"  (1.676 m)   Wt 155 lb (70.3 kg)   SpO2 96%   BMI 25.02 kg/m   Visual Acuity Right Eye Distance:   Left Eye Distance:   Bilateral Distance:    Right Eye Near:   Left Eye Near:    Bilateral Near:     Physical Exam Vitals and nursing note reviewed.  Constitutional:      General: He is not in acute distress.    Appearance: He is well-developed and well-nourished. He is not ill-appearing.  HENT:     Head: Normocephalic and atraumatic.     Mouth/Throat:     Mouth: Mucous membranes are moist.     Pharynx: Oropharynx is clear.  Eyes:     Conjunctiva/sclera: Conjunctivae normal.  Cardiovascular:     Rate and Rhythm: Normal rate and regular rhythm.     Heart sounds: Normal heart sounds.  Pulmonary:     Effort: Pulmonary effort is normal. No respiratory distress.     Breath sounds: Wheezing present.     Comments: Few scattered expiratory wheezes. Abdominal:     Palpations: Abdomen is soft.     Tenderness: There is no abdominal tenderness.  Musculoskeletal:        General: No edema.     Cervical back: Neck supple.  Skin:    General: Skin  is warm and dry.  Neurological:     General: No focal deficit present.     Mental Status: He is alert and oriented to person, place, and time.     Gait: Gait normal.  Psychiatric:        Mood and Affect: Mood and affect and mood normal.        Behavior: Behavior normal.      UC Treatments / Results  Labs (all labs ordered are listed, but only abnormal results are displayed) Labs Reviewed - No data to display  EKG   Radiology No results found.  Procedures Procedures (including critical care time)  Medications Ordered in UC Medications - No data to display  Initial Impression / Assessment and Plan / UC Course  I have reviewed the triage vital signs and the nursing notes.  Pertinent labs & imaging results that were available during my care of the patient were reviewed by me and considered in my medical decision making (see chart for details).   Exacerbation of asthma.  Treating with prednisone and a refill of albuterol inhaler.  Instructed patient to go to the ED if he has acute shortness of breath or other concerning symptoms.  Instructed him to follow-up with his PCP next week.  Patient agrees to plan of care.   Final Clinical Impressions(s) / UC Diagnoses   Final diagnoses:  Exacerbation of asthma, unspecified asthma severity, unspecified whether persistent     Discharge Instructions     Use the albuterol inhaler and take the prednisone as directed.    Go to  the Emergency Department if you have acute shortness of breath or other concerning symptoms.    Follow up with your primary care provider if your symptoms are not improving.        ED Prescriptions    Medication Sig Dispense Auth. Provider   albuterol (VENTOLIN HFA) 108 (90 Base) MCG/ACT inhaler Inhale 2 puffs into the lungs every 4 (four) hours as needed for wheezing or shortness of breath. 18 g Sharion Balloon, NP   predniSONE (DELTASONE) 10 MG tablet Take 4 tablets (40 mg total) by mouth daily for 5  days. 20 tablet Sharion Balloon, NP     I have reviewed the PDMP during this encounter.   Sharion Balloon, NP 04/23/20 1251

## 2020-04-23 NOTE — Discharge Instructions (Signed)
Use the albuterol inhaler and take the prednisone as directed.    Go to the Emergency Department if you have acute shortness of breath or other concerning symptoms.    Follow up with your primary care provider if your symptoms are not improving.

## 2020-04-23 NOTE — ED Triage Notes (Signed)
Pt reports he has had SHOB for 2 weeks and is out of his inhaler. Pt reports his wife is sick also.

## 2020-04-26 LAB — NOVEL CORONAVIRUS, NAA: SARS-CoV-2, NAA: NOT DETECTED

## 2020-05-05 IMAGING — CR DG CHEST 2V
2 series · 2 of 2 positions shown · non-contrast
Comparison: 11/13/2016

CLINICAL DATA: Awoke with chest pain and elevated blood pressure,
hypertension medications for 1 week

EXAM:
CHEST - 2 VIEW

[chest pa]
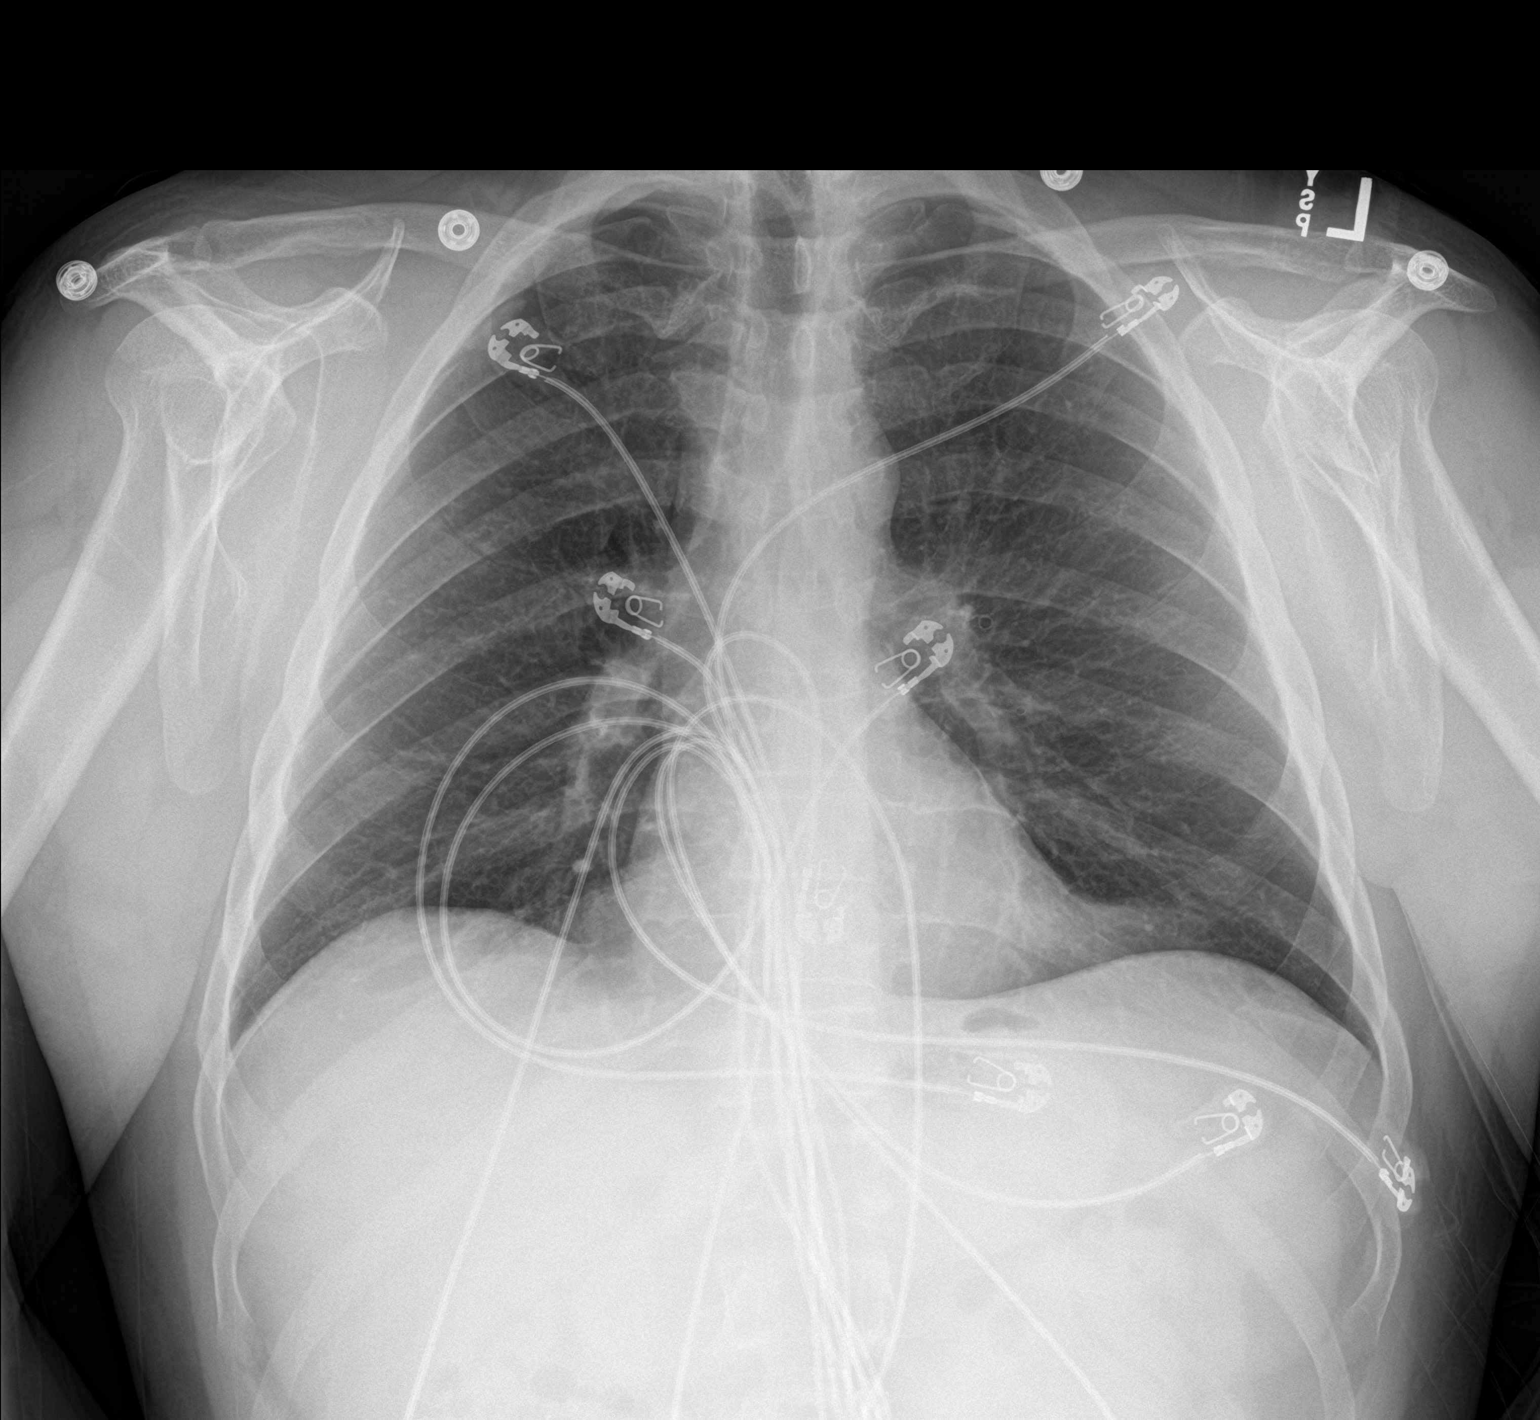

[chest lat]
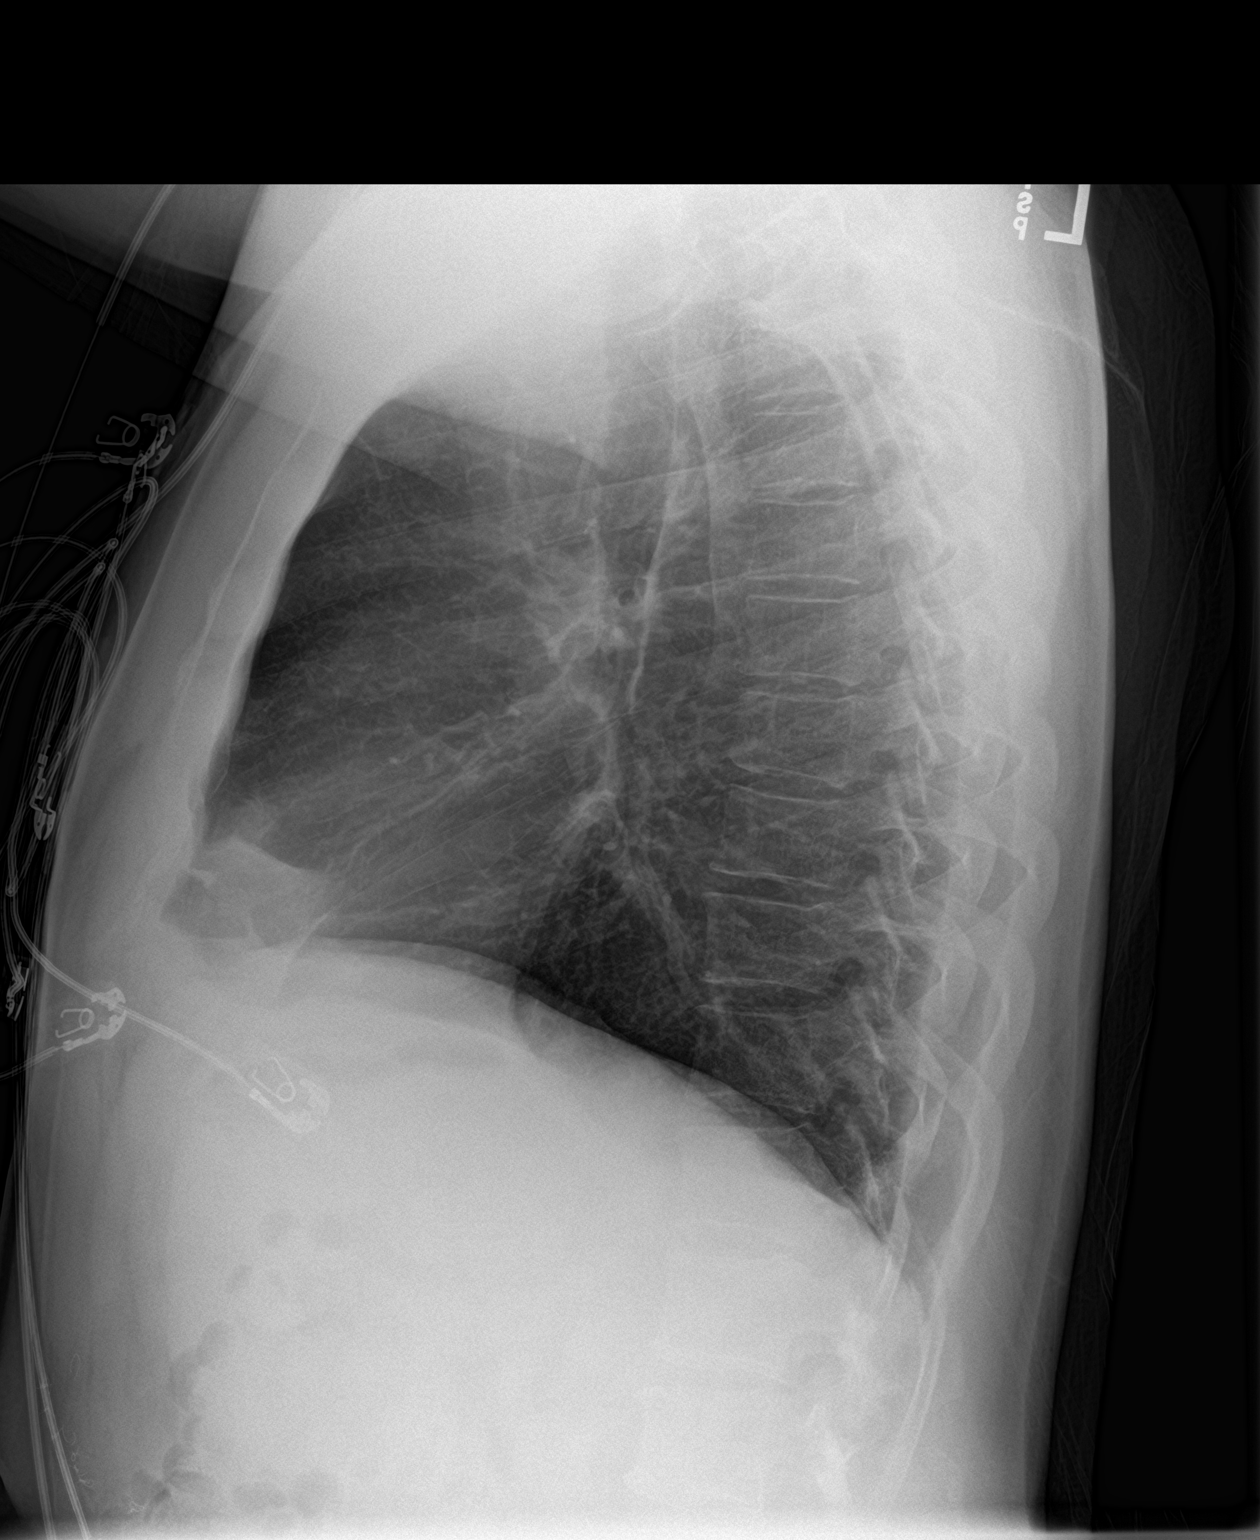

[2 of 2 positions shown; findings below may reference images not displayed]

FINDINGS: Normal heart size, mediastinal contours, and pulmonary vascularity.

Lungs clear.

No pulmonary infiltrate, pleural effusion or pneumothorax.

Minimal chronic central peribronchial thickening.

Bones unremarkable.
IMPRESSION: Minimal chronic bronchitic changes without infiltrate.

## 2020-05-09 ENCOUNTER — Ambulatory Visit (HOSPITAL_COMMUNITY)
Admission: EM | Admit: 2020-05-09 | Discharge: 2020-05-09 | Disposition: A | Discharge status: Home / Self Care | Payer: Medicaid Other

## 2020-05-09 ENCOUNTER — Other Ambulatory Visit: Payer: Self-pay

## 2020-05-09 NOTE — ED Triage Notes (Signed)
Pt called on phone and in lobby with no answer.

## 2020-06-26 ENCOUNTER — Encounter (HOSPITAL_COMMUNITY): Payer: Self-pay | Admitting: Urgent Care

## 2020-06-26 ENCOUNTER — Ambulatory Visit (INDEPENDENT_AMBULATORY_CARE_PROVIDER_SITE_OTHER): Payer: Medicaid Other

## 2020-06-26 ENCOUNTER — Ambulatory Visit (HOSPITAL_COMMUNITY)
Admission: EM | Admit: 2020-06-26 | Discharge: 2020-06-26 | Disposition: A | Discharge status: Home / Self Care | Payer: Medicaid Other | Attending: Urgent Care | Admitting: Urgent Care

## 2020-06-26 ENCOUNTER — Other Ambulatory Visit: Payer: Self-pay

## 2020-06-26 DIAGNOSIS — R058 Other specified cough: Secondary | ICD-10-CM | POA: Insufficient documentation

## 2020-06-26 DIAGNOSIS — R0989 Other specified symptoms and signs involving the circulatory and respiratory systems: Secondary | ICD-10-CM | POA: Diagnosis not present

## 2020-06-26 DIAGNOSIS — Z79899 Other long term (current) drug therapy: Secondary | ICD-10-CM | POA: Diagnosis not present

## 2020-06-26 DIAGNOSIS — R062 Wheezing: Secondary | ICD-10-CM

## 2020-06-26 DIAGNOSIS — Z20822 Contact with and (suspected) exposure to covid-19: Secondary | ICD-10-CM | POA: Diagnosis not present

## 2020-06-26 DIAGNOSIS — J4540 Moderate persistent asthma, uncomplicated: Secondary | ICD-10-CM | POA: Insufficient documentation

## 2020-06-26 LAB — SARS CORONAVIRUS 2 (TAT 6-24 HRS): SARS Coronavirus 2: NEGATIVE

## 2020-06-26 MED ORDER — BENZONATATE 100 MG PO CAPS: 100.0000 mg | ORAL_CAPSULE | Freq: Three times a day (TID) | ORAL | 0 refills | Status: DC | PRN

## 2020-06-26 MED ORDER — ALBUTEROL SULFATE HFA 108 (90 BASE) MCG/ACT IN AERS
2.0000 | INHALATION_SPRAY | RESPIRATORY_TRACT | 0 refills | Status: DC | PRN
Start: 2020-06-26 — End: 2020-12-28

## 2020-06-26 MED ORDER — PREDNISONE 20 MG PO TABS
ORAL_TABLET | ORAL | 0 refills | Status: DC
Start: 2020-06-26 — End: 2020-12-28

## 2020-06-26 MED ORDER — PROMETHAZINE-DM 6.25-15 MG/5ML PO SYRP
5.0000 mL | ORAL_SOLUTION | Freq: Every evening | ORAL | 0 refills | Status: DC | PRN
Start: 2020-06-26 — End: 2020-12-28

## 2020-06-26 MED ORDER — ALBUTEROL SULFATE (2.5 MG/3ML) 0.083% IN NEBU
2.5000 mg | INHALATION_SOLUTION | Freq: Four times a day (QID) | RESPIRATORY_TRACT | 0 refills | Status: DC | PRN
Start: 2020-06-26 — End: 2020-12-28

## 2020-06-26 NOTE — ED Provider Notes (Signed)
[name]   MRN: [id number] DOB: 10/26/1965  Subjective:   Adam [name] is a 55 y.o. male presenting for 1 month history of persistent chest congestion, productive cough, wheezing.  Some days he has shortness of breath.  Has a history of asthma, ran out of his albuterol inhaler.  Would like a refill on his nebulized albuterol as well.  Denies smoking cigarettes.  Denies fever, body aches, chest pain.  He is not COVID vaccinated.   Current Facility-Administered Medications:  .  0.9 %  sodium chloride infusion, 500 mL, Intravenous, Once, [place]uster, Carlota Raspberry, MD  Current Outpatient Medications:  .  albuterol (PROVENTIL) (2.5 MG/3ML) 0.083% nebulizer solution, Take 3 mLs (2.5 mg total) by nebulization every 6 (six) hours as needed for wheezing or shortness of breath., Disp: 75 mL, Rfl: 0 .  ALPRAZolam (XANAX) 1 MG tablet, Take 0.5 mg by mouth at bedtime., Disp: , Rfl:  .  metoprolol tartrate (LOPRESSOR) 25 MG tablet, Take 25 mg by mouth 2 (two) times daily., Disp: , Rfl:  .  albuterol (VENTOLIN HFA) 108 (90 Base) MCG/ACT inhaler, Inhale 2 puffs into the lungs every 4 (four) hours as needed for wheezing or shortness of breath., Disp: 18 g, Rfl: 0 .  metoprolol succinate (TOPROL-XL) 50 MG 24 hr tablet, Take 1 tablet (50 mg total) by mouth daily. Take with or immediately following a meal., Disp: 90 tablet, Rfl: 0 .  naproxen (NAPROSYN) 500 MG tablet, Take 1 tablet (500 mg total) by mouth 2 (two) times daily., Disp: 30 tablet, Rfl: 0   Allergies  Allergen Reactions  . Hctz [Hydrochlorothiazide] Other (See Comments)    Per wife, pt developed joint pain and swelling (esp shoulders and elbows)    . Penicillins Other (See Comments)    Pt has family history of allergy but does not remember ever taking this    Past Medical History:  Diagnosis Date  . Anxiety state, unspecified   . Esophageal reflux    no meds. per pt.  . Family history of stress   . Generalized  headaches   . Hiatal hernia    pt. denies nerve/mucscle disease  . Hypertension   . IBS (irritable bowel syndrome)   . Major depressive disorder, single episode, unspecified   . Palpitations   . Panic disorder without agoraphobia   . Prostatitis, unspecified   . RBBB (right bundle branch block)   . Sleep apnea   . Unspecified cardiovascular disease   . Visual disturbance      Past Surgical History:  Procedure Laterality Date  . COLONOSCOPY  07/26/2005   normal  . COLONOSCOPY    . ESOPHAGOGASTRODUODENOSCOPY  01/03/2005   normal  . FRACTURE SURGERY  2000   left arm  . HERNIA REPAIR  03/14/2011   BIH  . INGUINAL HERNIA REPAIR  03/14/2011   Procedure: LAPAROSCOPIC BILATERAL INGUINAL HERNIA REPAIR;  Surgeon: Harl Bowie, MD;  Location: [facility];  Service: General;  Laterality: N/A;  with mesh  . pyloric stenosis     . right arm     fracture--Right arm fracture s/p ORIF from roller blading.  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 Heart attack Mother 41       PTCA  and 3 stents  . Heart disease Mother   . COPD Mother   . Hodgkin's  lymphoma Maternal Grandmother   . Cancer Maternal Aunt        pt unaware of what kind  . Cancer Maternal Uncle        pt unaware of what kind  . Pyloric stenosis Daughter 6       X2  . Anxiety disorder Other   . Bipolar disorder Daughter   . Colon cancer Brother   . Esophageal cancer Neg Hx   . Rectal cancer Neg Hx   . Stomach cancer Neg Hx     Social History   Tobacco Use  . Smoking status: Never Smoker  . Smokeless tobacco: Never Used  Vaping Use  . Vaping Use: Never used  Substance Use Topics  . Alcohol use: No  . Drug use: No    ROS   Objective:   Vitals: BP (!) 151/83 (BP Location: Right Arm)   Pulse (!) 56   Temp 98.3 F (36.8 C) (Oral)   SpO2 97%   Physical Exam Constitutional:      General: He is not in acute  distress.    Appearance: Normal appearance. He is well-developed. He is not ill-appearing, toxic-appearing or diaphoretic.  HENT:     Head: Normocephalic and atraumatic.     Right Ear: External ear normal.     Left Ear: External ear normal.     Nose: Nose normal.     Mouth/Throat:     Mouth: Mucous membranes are moist.     Pharynx: Oropharynx is clear.  Eyes:     General: No scleral icterus.    Extraocular Movements: Extraocular movements intact.     Pupils: Pupils are equal, round, and reactive to light.  Cardiovascular:     Rate and Rhythm: Normal rate and regular rhythm.     Heart sounds: Normal heart sounds. No murmur heard. No friction rub. No gallop.   Pulmonary:     Effort: Pulmonary effort is normal. No respiratory distress.     Breath sounds: No stridor. Examination of the right-upper field reveals wheezing. Examination of the left-upper field reveals wheezing. Examination of the right-middle field reveals wheezing and rhonchi. Examination of the left-middle field reveals wheezing and rhonchi. Examination of the right-lower field reveals wheezing and rhonchi. Examination of the left-lower field reveals wheezing and rhonchi. Wheezing and rhonchi present. No rales.     Comments: No use of accessory muscles. Neurological:     Mental Status: He is alert and oriented to person, place, and time.  Psychiatric:        Mood and Affect: Mood normal.        Behavior: Behavior normal.        Thought Content: Thought content normal.     DG Chest 2 View  Result Date: 06/26/2020 CLINICAL DATA:  Chest congestion. EXAM: CHEST - 2 VIEW COMPARISON:  09/06/2018 FINDINGS: The heart size and mediastinal contours are within normal limits. Both lungs are clear. The visualized skeletal structures are unremarkable. IMPRESSION: No active cardiopulmonary disease. Electronically Signed   By: Kerby Moors M.D.   On: 06/26/2020 11:07     Assessment and Plan :   PDMP not reviewed this  encounter.  1. Chest congestion   2. Wheezing   3. Productive cough   4. Moderate persistent asthma without complication     Chest x-ray is negative, COVID-19 testing pending.  In light of his asthma, will use a prednisone course.  Refilled his nebulized albuterol and albuterol inhaler.  Use supportive care otherwise.  Counseled patient on potential for adverse effects with medications prescribed/recommended today, ER and return-to-clinic precautions discussed, patient verbalized understanding.    Jaynee Eagles, PA-C 06/26/20 1130

## 2020-06-26 NOTE — ED Triage Notes (Signed)
Patient complains of chest congestion , thick clear phlegm, and has noticed wheezing.  Symptoms for a month, some days are better than other days.  Patient is out of inhaler.  Patient comments that he uses kerosene heater

## 2020-12-28 ENCOUNTER — Ambulatory Visit (HOSPITAL_COMMUNITY)
Admission: EM | Admit: 2020-12-28 | Discharge: 2020-12-28 | Disposition: A | Discharge status: Home / Self Care | Payer: Medicaid Other | Attending: Internal Medicine | Admitting: Internal Medicine

## 2020-12-28 ENCOUNTER — Encounter (HOSPITAL_COMMUNITY): Payer: Self-pay

## 2020-12-28 ENCOUNTER — Other Ambulatory Visit: Payer: Self-pay

## 2020-12-28 DIAGNOSIS — J4521 Mild intermittent asthma with (acute) exacerbation: Secondary | ICD-10-CM

## 2020-12-28 MED ORDER — ALBUTEROL SULFATE (2.5 MG/3ML) 0.083% IN NEBU
2.5000 mg | INHALATION_SOLUTION | Freq: Four times a day (QID) | RESPIRATORY_TRACT | 0 refills | Status: DC | PRN
Start: 2020-12-28 — End: 2022-02-22

## 2020-12-28 MED ORDER — PREDNISONE 20 MG PO TABS
40.0000 mg | ORAL_TABLET | Freq: Every day | ORAL | 0 refills | Status: AC
Start: 2020-12-28 — End: 2021-01-02

## 2020-12-28 MED ORDER — METHYLPREDNISOLONE SODIUM SUCC 125 MG IJ SOLR
60.0000 mg | Freq: Once | INTRAMUSCULAR | Status: AC
  Administered 2020-12-28: 60 mg via INTRAMUSCULAR

## 2020-12-28 MED ORDER — METHYLPREDNISOLONE SODIUM SUCC 125 MG IJ SOLR
INTRAMUSCULAR | Status: AC
  Filled 2020-12-28: qty 2

## 2020-12-28 MED ORDER — ALBUTEROL SULFATE HFA 108 (90 BASE) MCG/ACT IN AERS
2.0000 | INHALATION_SPRAY | RESPIRATORY_TRACT | 0 refills | Status: DC | PRN
Start: 2020-12-28 — End: 2021-03-17

## 2020-12-28 NOTE — ED Triage Notes (Signed)
Pt reports cough, wheezing, shortness of breath, fatigue x 1 week. Albuterol inhaler gives some relief.   Pt requested albuterol inhaler refill.

## 2020-12-28 NOTE — ED Provider Notes (Signed)
[place]    CSN: [id number] Arrival date & time: 12/28/20  1508      History   Chief Complaint Chief Complaint  Patient presents with   Shortness of Breath   Headache   Cough   Fatigue    HPI Adam [name] is a 55 y.o. male with a history of asthma managed with albuterol inhaler/nebulizer solutions comes to the urgent care with 1 week history of worsening shortness of breath, chest tightness, wheezing, cough productive of clear sputum.  Patient says he recently ran out of his albuterol.  He denies any fever or chills.  No generalized body aches.  Patient denies any seasonal allergies.  No fatigue.  Patient gets asthma exacerbation probably every couple of months.   HPI  Past Medical History:  Diagnosis Date   Anxiety state, unspecified    Esophageal reflux    no meds. per pt.   Family history of stress    Generalized headaches    Hiatal hernia    pt. denies nerve/mucscle disease   Hypertension    IBS (irritable bowel syndrome)    Major depressive disorder, single episode, unspecified    Palpitations    Panic disorder without agoraphobia    Prostatitis, unspecified    RBBB (right bundle branch block)    Sleep apnea    Unspecified cardiovascular disease    Visual disturbance     Patient Active Problem List   Diagnosis Date Noted   Lipoma of abdominal wall 03/17/2014   Onychomycosis 01/18/2014   Pain, dental 12/16/2013   Cervical strain, acute 12/16/2013   Pneumonia 11/12/2013   URI (upper respiratory infection) 08/13/2011   Cough 08/09/2011   IBS (irritable bowel syndrome)- Constipation Predominant 06/19/2011   Headache(784.0) 05/14/2011   Light-headedness 05/14/2011   Left ear pain 05/14/2011   Inguinal hernia, bilateral 03/02/2011   Bilateral inguinal hernia 02/26/2011   Dental abscess 02/15/2011   Shortness of breath 01/30/2011   Fatigue 12/13/2010   Abdominal pain 10/20/2010   Shoulder pain, bilateral 07/31/2010   Orthopnea  07/31/2010   BACK PAIN, CHRONIC 06/09/2010   UNSPECIFIED VISUAL DISTURBANCE 12/12/2009   UNSPECIFIED CONDITION OF BRAIN 10/19/2009   ALLERGIC RHINITIS, SEASONAL 08/25/2009   HYPOKALEMIA, HX OF 12/30/2008   Essential hypertension, benign 08/04/2007   LOW BACK PAIN, CHRONIC 08/04/2007   Mood disorder NOS 05/28/2007   PALPITATIONS 04/25/2007   CHEST PAIN 04/18/2007   PROSTATITIS NOS 08/14/2006   ANXIETY 07/04/2006   PANIC ATTACKS 07/04/2006   GASTROESOPHAGEAL REFLUX, NO ESOPHAGITIS 07/04/2006    Past Surgical History:  Procedure Laterality Date   COLONOSCOPY  07/26/2005   normal   COLONOSCOPY     ESOPHAGOGASTRODUODENOSCOPY  01/03/2005   normal   FRACTURE SURGERY  2000   left arm   HERNIA REPAIR  03/14/2011   BIH   INGUINAL HERNIA REPAIR  03/14/2011   Procedure: LAPAROSCOPIC BILATERAL INGUINAL HERNIA REPAIR;  Surgeon: Harl Bowie, MD;  Location: [facility];  Service: General;  Laterality: N/A;  with mesh   pyloric stenosis      right arm     fracture--Right arm fracture s/p ORIF from roller blading.   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 Authorizing Provider  predniSONE (DELTASONE) 20 MG tablet Take 2 tablets (40 mg total) by mouth daily for  5 days. 12/28/20 01/02/21 Yes Josha Weekley, Myrene Galas, MD  albuterol (PROVENTIL) (2.5 MG/3ML) 0.083% nebulizer solution Take 3 mLs (2.5 mg total) by nebulization every 6 (six) hours as needed for wheezing or shortness of breath. 12/28/20   Anushka Hartinger, Myrene Galas, MD  albuterol (VENTOLIN HFA) 108 (90 Base) MCG/ACT inhaler Inhale 2 puffs into the lungs every 4 (four) hours as needed for wheezing or shortness of breath. 12/28/20   Karlee Staff, Myrene Galas, MD  ALPRAZolam Duanne Moron) 1 MG tablet Take 0.5 mg by mouth at bedtime.    [provider]  metoprolol succinate (TOPROL-XL) 50 MG 24 hr tablet Take 1 tablet (50 mg  total) by mouth daily. Take with or immediately following a meal. 06/02/18 06/06/19  Jacqlyn Larsen, PA-C  dicyclomine (BENTYL) 10 MG capsule Take 2 capsules (20 mg total) by mouth 4 (four) times daily -  before meals and at bedtime. Take 20-30 minutes before meals Patient not taking: Reported on 05/26/2019 05/20/19 06/26/20  Levin Erp, PA    Family History Family History  Problem Relation Age of Onset   Heart attack Mother 72       PTCA  and 3 stents   Heart disease Mother    COPD Mother    Hodgkin's lymphoma Maternal Grandmother    Cancer Maternal Aunt        pt unaware of what kind   Cancer Maternal Uncle        pt unaware of what kind   Pyloric stenosis Daughter 66       X2   Anxiety disorder Other    Bipolar disorder Daughter    Colon cancer Brother    Esophageal cancer Neg Hx    Rectal cancer Neg Hx    Stomach cancer Neg Hx     Social History Social History   Tobacco Use   Smoking status: Never   Smokeless tobacco: Never  Vaping Use   Vaping Use: Never used  Substance Use Topics   Alcohol use: No   Drug use: No     Allergies   Hctz [hydrochlorothiazide] and Penicillins   Review of Systems Review of Systems  Constitutional: Negative.  Negative for chills, fatigue and fever.  HENT: Negative.    Respiratory:  Positive for cough, chest tightness, shortness of breath and wheezing.   Cardiovascular:  Negative for chest pain.  Gastrointestinal: Negative.     Physical Exam Triage Vital Signs ED Triage Vitals  Enc Vitals Group     BP 12/28/20 1644 (!) 156/93     Pulse Rate 12/28/20 1644 62     Resp 12/28/20 1647 (!) 22     Temp 12/28/20 1644 98.6 F (37 C)     Temp src --      SpO2 12/28/20 1644 96 %     Weight --      Height --      Head Circumference --      Peak Flow --      Pain Score 12/28/20 1644 0     Pain Loc --      Pain Edu? --      Excl. in [place]? --    No data found.  Updated Vital Signs BP (!) 156/93 (BP Location: Left Arm)    Pulse 62   Temp 98.6 F (37 C)   Resp (!) 22   SpO2 96%   Visual Acuity Right Eye Distance:   Left Eye Distance:   Bilateral Distance:  Right Eye Near:   Left Eye Near:    Bilateral Near:     Physical Exam Vitals and nursing note reviewed.  Constitutional:      General: He is not in acute distress.    Appearance: He is not ill-appearing.  Cardiovascular:     Rate and Rhythm: Normal rate and regular rhythm.  Pulmonary:     Breath sounds: Examination of the right-upper field reveals wheezing. Examination of the left-upper field reveals wheezing. Examination of the right-middle field reveals wheezing. Examination of the left-middle field reveals wheezing. Examination of the right-lower field reveals wheezing. Examination of the left-lower field reveals wheezing. Wheezing present. No decreased breath sounds, rhonchi or rales.  Abdominal:     General: Bowel sounds are normal.     Palpations: Abdomen is soft.  Neurological:     Mental Status: He is alert.     UC Treatments / Results  Labs (all labs ordered are listed, but only abnormal results are displayed) Labs Reviewed - No data to display  EKG   Radiology No results found.  Procedures Procedures (including critical care time)  Medications Ordered in UC Medications  methylPREDNISolone sodium succinate (SOLU-MEDROL) 125 mg/2 mL injection 60 mg (60 mg Intramuscular Given 12/28/20 1749)    Initial Impression / Assessment and Plan / UC Course  I have reviewed the triage vital signs and the nursing notes.  Pertinent labs & imaging results that were available during my care of the patient were reviewed by me and considered in my medical decision making (see chart for details).     1.  Mild intermittent asthma with acute exacerbation: Solu-Medrol 60 mg IM x1 dose Prednisone 40 mg orally daily for 5 days Refill albuterol inhaler and albuterol nebulizing solutions No indication for chest x-ray at this time Return  to urgent care if symptoms worsen. Final Clinical Impressions(s) / UC Diagnoses   Final diagnoses:  Mild intermittent asthma with (acute) exacerbation     Discharge Instructions      Please take medications as prescribed If symptoms worsen please return to the urgent care for further evaluation No indication for a chest Xray at this time   ED Prescriptions     Medication Sig Dispense Auth. Provider   albuterol (PROVENTIL) (2.5 MG/3ML) 0.083% nebulizer solution Take 3 mLs (2.5 mg total) by nebulization every 6 (six) hours as needed for wheezing or shortness of breath. 75 mL Consuelo Thayne, Myrene Galas, MD   albuterol (VENTOLIN HFA) 108 (90 Base) MCG/ACT inhaler Inhale 2 puffs into the lungs every 4 (four) hours as needed for wheezing or shortness of breath. 18 g Zahava Quant, Myrene Galas, MD   predniSONE (DELTASONE) 20 MG tablet Take 2 tablets (40 mg total) by mouth daily for 5 days. 10 tablet Aliene Tamura, Myrene Galas, MD      PDMP not reviewed this encounter.   Chase Picket, MD 12/28/20 [phone]

## 2020-12-28 NOTE — Discharge Instructions (Addendum)
Please take medications as prescribed If symptoms worsen please return to the urgent care for further evaluation No indication for a chest Xray at this time

## 2021-02-24 ENCOUNTER — Other Ambulatory Visit: Payer: Self-pay

## 2021-02-24 ENCOUNTER — Ambulatory Visit: Payer: Medicaid Other | Admitting: Student

## 2021-02-24 ENCOUNTER — Encounter: Payer: Self-pay | Admitting: Student

## 2021-02-24 VITALS — BP 128/82 | HR 62 | Ht 66.0 in | Wt 161.0 lb

## 2021-02-24 DIAGNOSIS — R002 Palpitations: Secondary | ICD-10-CM

## 2021-02-24 DIAGNOSIS — F411 Generalized anxiety disorder: Secondary | ICD-10-CM | POA: Diagnosis not present

## 2021-02-24 DIAGNOSIS — K635 Polyp of colon: Secondary | ICD-10-CM

## 2021-02-24 DIAGNOSIS — G939 Disorder of brain, unspecified: Secondary | ICD-10-CM

## 2021-02-24 DIAGNOSIS — R09A2 Foreign body sensation, throat: Secondary | ICD-10-CM | POA: Insufficient documentation

## 2021-02-24 DIAGNOSIS — R0789 Other chest pain: Secondary | ICD-10-CM

## 2021-02-24 DIAGNOSIS — K581 Irritable bowel syndrome with constipation: Secondary | ICD-10-CM | POA: Diagnosis not present

## 2021-02-24 DIAGNOSIS — R0602 Shortness of breath: Secondary | ICD-10-CM | POA: Diagnosis not present

## 2021-02-24 DIAGNOSIS — R062 Wheezing: Secondary | ICD-10-CM

## 2021-02-24 DIAGNOSIS — Z0000 Encounter for general adult medical examination without abnormal findings: Secondary | ICD-10-CM | POA: Insufficient documentation

## 2021-02-24 DIAGNOSIS — R0989 Other specified symptoms and signs involving the circulatory and respiratory systems: Secondary | ICD-10-CM | POA: Diagnosis not present

## 2021-02-24 DIAGNOSIS — I10 Essential (primary) hypertension: Secondary | ICD-10-CM | POA: Diagnosis not present

## 2021-02-24 MED ORDER — FLUTICASONE PROPIONATE HFA 110 MCG/ACT IN AERO: 1.0000 | INHALATION_SPRAY | Freq: Every day | RESPIRATORY_TRACT | 12 refills | Status: DC

## 2021-02-24 NOTE — Assessment & Plan Note (Addendum)
Constipation type IBS.  -Encouraged fruits, vegetables, fiber with increased water intake

## 2021-02-24 NOTE — Assessment & Plan Note (Signed)
Ordered CBC, CMP, Lipid Panel. Encouraged flu and covid vaccinations. Patient does not want either.

## 2021-02-24 NOTE — Assessment & Plan Note (Signed)
Feels like there's a "mass" when he swallows. No mass or thyromegaly palpated on examination. No weight loss, night sweats or fevers. Has history of GERD in chart review. Unlikely to be cancerous lesion due to no smoking history. -Monitor

## 2021-02-24 NOTE — Assessment & Plan Note (Signed)
Was 128/82 in office today. Has had periods of lightheadedness but says his BP runs in the 140s/80s at home. -continue metoprolol 25 mg/day

## 2021-02-24 NOTE — Assessment & Plan Note (Signed)
Likely related to asthma/respiratory process. No radiation of pain, constant tightness for many years.  -Monitor, see if improving with addition of controller medication

## 2021-02-24 NOTE — Assessment & Plan Note (Signed)
Will need repeat colonoscopy every 3 years. Last one 2021.

## 2021-02-24 NOTE — Assessment & Plan Note (Signed)
Normal heart examination on my physical exam today with normal rate and rhythm. Has hx of bundle branch block and anxiety. No syncope. -Continue metoprolol daily -Monitor

## 2021-02-24 NOTE — Assessment & Plan Note (Signed)
Gets 3-4 hours of sleep daily and has many stressors at home with finances. Seen by psychiatrist who prescribes him Xanax. He takes this to go to sleep most nights.

## 2021-02-24 NOTE — Assessment & Plan Note (Signed)
No focal neuro deficits or facial droop. -Monitor

## 2021-02-24 NOTE — Patient Instructions (Signed)
It was great to see you! Thank you for allowing me to participate in your care!   I recommend that you always bring your medications to each appointment as this makes it easy to ensure we are on the correct medications and helps [place] not miss when refills are needed.  Our plans for today:  - I am starting you on a daily medication called Flovent  -We will have you follow up with the pharmacist in a week or 2 to get your lungs tested - I recommend the flu and covid vaccine for patients with issues in their lungs  We are checking some labs today, I will call you if they are abnormal will send you a MyChart message or a letter if they are normal.  If you do not hear about your labs in the next 2 weeks please let us know.  Take care and seek immediate care sooner if you develop any concerns. Please remember to show up 15 minutes before your scheduled appointment time!  Gerrit Heck, MD [place]

## 2021-02-24 NOTE — Assessment & Plan Note (Addendum)
Was diffusely wheezing on examination today. More likely to be asthma picture given no smoking history, normal PFTs in 2013. Has had multiple ED visits for exacerbations. Currently taking albuterol daily. -Started daily Flovent 110 mcg daily -Encouraged flu and covid vaccines but patient does not wish to get  -Follow up with Dr. Valentina Lucks for PFTs -Follow up with me in one month to see if any improvement

## 2021-02-24 NOTE — Progress Notes (Signed)
New Patient Office Visit  Subjective:  Patient ID: Adam [name], male    DOB: [birth year]  Age: 55 y.o. MRN: [id number]  CC:  Chief Complaint  Patient presents with   Establish Care    HPI Adam [name] presents for establishing care. He is worried about his blood pressure and cancer as his brother died at 87 from colon cancer and many family members had early deaths.  Hypertension His BP ranges at home mostly 140/85 average when he takes it at home. Says he does not have many headaches, no vision changes, chronic chest tightness, and sometimes feels lightheaded. Currently taking 25 mg metoprolol daily.  SOB/Wheezing Had normal PFTs in 2013 but continues to have chest tightness and dyspnea. Has never smoked before. Has been taking albuterol 1/day. Patient has had multiple asthma exacerbations with ED visits this year. He does not want COVID or Flu vaccines.   Chest tightness/Palpitations Always had chest tightness. Has history of right bundle branch block. For years he has had this feeling of tightness and feels like a bear hug. Patient says he was told he had a "silent heart attack" at one point in his life. Per chart review he has been on simvastatin before (01/01/2008). Per chart review seems to have had 2 heart catheterizations. Last one in 2012 showed LVEF of 55-60% and patent arteries in heart and no stent placements. He feels like his heart beats differently sometimes since he started metoprolol 15 years ago every now and then. No fainting or LOC but feels light headed sometimes.  Anxiety Better when moving and doing things. Does not have many panic attacks now. Has bad Insomnia-trouble staying and falling asleep for a long time. Takes xanax prescribed by psychiatrist.  Globus Sensation Says he feels like there's a mass when he swallows. No weight loss. Has history of GERD in chart review.   Past Medical History:  Diagnosis Date   Anxiety state, unspecified    BACK  PAIN, CHRONIC 06/09/2010   Qualifier: Diagnosis of  By: Ernestina Patches MD, Steven     Bilateral inguinal hernia 02/26/2011   Dental abscess 02/15/2011   Esophageal reflux    no meds. per pt.   Family history of stress    Generalized headaches    Hiatal hernia    pt. denies nerve/mucscle disease   Hypertension    IBS (irritable bowel syndrome)    Major depressive disorder, single episode, unspecified    Onychomycosis 01/18/2014   Palpitations    Panic disorder without agoraphobia    Prostatitis, unspecified    RBBB (right bundle branch block)    Sleep apnea    Unspecified cardiovascular disease    Visual disturbance     Past Surgical History:  Procedure Laterality Date   COLONOSCOPY  07/26/2005   normal   COLONOSCOPY  05/26/2019   6 small adenomas noted on this colonoscopy, repeat in 3 years for surveillance   ESOPHAGOGASTRODUODENOSCOPY  01/03/2005   normal   FRACTURE SURGERY  2000   left arm   HERNIA REPAIR  03/14/2011   BIH   INGUINAL HERNIA REPAIR  03/14/2011   Procedure: LAPAROSCOPIC BILATERAL INGUINAL HERNIA REPAIR;  Surgeon: Harl Bowie, MD;  Location: [facility];  Service: General;  Laterality: N/A;  with mesh   pyloric stenosis      testicle  55 years old   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    Family History  Problem Relation Age of Onset   Heart  attack Mother 51       PTCA  and 3 stents   Heart disease Mother    COPD Mother    Anxiety disorder Mother    High blood pressure Mother    Kidney disease Mother    Drug abuse Brother        69 passed away from xanax   Colon cancer Brother        passed away at 47   Hodgkin's lymphoma Maternal Grandmother        66   Heart disease Maternal Grandfather    Pyloric stenosis Daughter 28       X2   Bipolar disorder Daughter    Cancer Maternal Aunt        pt unaware of what kind   Cancer Maternal Uncle        pt unaware of what kind   Anxiety disorder Other    Esophageal cancer Neg Hx    Rectal  cancer Neg Hx    Stomach cancer Neg Hx     Social History   Socioeconomic History   Marital status: Married    Spouse name: Not on file   Number of children: 9   Years of education: Not on file   Highest education level: Not on file  Occupational History   Occupation: Teacher, music: [employer]  Tobacco Use   Smoking status: Never    Passive exposure: Past   Smokeless tobacco: Never  Vaping Use   Vaping Use: Never used  Substance and Sexual Activity   Alcohol use: No   Drug use: No   Sexual activity: Yes    Partners: Female  Other Topics Concern   Not on file  Social History Narrative   Says current working in [employer]. [employer] x 55yrs.  He has nine children, 2 from first marriage, and 7 from 42rd. Lives with wife 63, and 3 sons 48, 88, and 46 years old. Denies smoking, EtOH, recreational drugs, + remote h/o marijuana use when he was in his early 20s. Owns a car, does not exercise regularly. Fishing, boating, and camping for fun. Stressors are financial but can afford food and housing.   Social Determinants of Health   Financial Resource Strain: Not on file  Food Insecurity: Not on file  Transportation Needs: Not on file  Physical Activity: Not on file  Stress: Not on file  Social Connections: Not on file  Intimate Partner Violence: Not on file    ROS Review of Systems  Constitutional:  Negative for fever and unexpected weight change.  HENT:  Positive for trouble swallowing.   Eyes:  Negative for visual disturbance.  Respiratory:  Positive for chest tightness and shortness of breath.   Cardiovascular:  Negative for leg swelling.  Gastrointestinal:  Positive for abdominal pain. Negative for abdominal distention, constipation and diarrhea.  Endocrine: Negative for cold intolerance and heat intolerance.  Genitourinary:  Negative for dysuria.  Musculoskeletal:  Negative for gait problem.  Skin:  Negative for rash.  Neurological:  Positive for  light-headedness. Negative for facial asymmetry and speech difficulty.  Psychiatric/Behavioral:  Positive for sleep disturbance. The patient is nervous/anxious.    Objective:   Today's Vitals: BP 128/82   Pulse 62   Ht 5\' 6"  (1.676 m)   Wt 161 lb (73 kg)   SpO2 98%   BMI 25.99 kg/m   Physical Exam Constitutional:      Appearance: Normal appearance.  HENT:  Head: Normocephalic and atraumatic.  Eyes:     Conjunctiva/sclera: Conjunctivae normal.  Neck:     Thyroid: No thyroid mass, thyromegaly or thyroid tenderness.  Cardiovascular:     Rate and Rhythm: Normal rate and regular rhythm.     Pulses: Normal pulses.     Heart sounds: Normal heart sounds. No murmur heard.   No friction rub. No gallop.  Pulmonary:     Effort: Prolonged expiration present. No respiratory distress or retractions.     Breath sounds: Examination of the right-upper field reveals wheezing. Examination of the left-upper field reveals wheezing. Examination of the right-middle field reveals wheezing. Examination of the left-middle field reveals wheezing. Examination of the right-lower field reveals wheezing. Examination of the left-lower field reveals wheezing. Wheezing present. No rhonchi or rales.  Abdominal:     General: Abdomen is flat. Bowel sounds are normal. There is no distension.     Palpations: Abdomen is soft. There is no mass.     Tenderness: There is no abdominal tenderness. There is no guarding.  Musculoskeletal:        General: No swelling or deformity. Normal range of motion.     Cervical back: Normal range of motion and neck supple. No tenderness.  Lymphadenopathy:     Cervical: No cervical adenopathy.  Skin:    General: Skin is warm and dry.  Neurological:     General: No focal deficit present.     Mental Status: He is alert.     Gait: Gait normal.    Assessment & Plan:   Problem List Items Addressed This Visit       Unprioritized   Condition of brain    No focal neuro deficits  or facial droop. -Monitor       Colon polyps    Will need repeat colonoscopy every 3 years. Last one 2021.      Healthcare maintenance    Ordered CBC, CMP, Lipid Panel. Encouraged flu and covid vaccinations. Patient does not want either.      Relevant Orders   CBC   Comprehensive metabolic panel   Lipid Panel   Globus sensation    Feels like there's a "mass" when he swallows. No mass or thyromegaly palpated on examination. No weight loss, night sweats or fevers. Has history of GERD in chart review. Unlikely to be cancerous lesion due to no smoking history. -Monitor        1.   Shortness of breath    Was diffusely wheezing on examination today. More likely to be asthma picture given no smoking history, normal PFTs in 2013. Has had multiple ED visits for exacerbations. Currently taking albuterol daily. -Started daily Flovent 110 mcg daily -Encouraged flu and covid vaccines but patient does not wish to get  -Follow up with Dr. Valentina Lucks for PFTs -Follow up with me in one month to see if any improvement        2.   Chest tightness    Likely related to asthma/respiratory process. No radiation of pain, constant tightness for many years.  -Monitor, see if improving with addition of controller medication        3.   Essential hypertension, benign    Was 128/82 in office today. Has had periods of lightheadedness but says his BP runs in the 140s/80s at home. -continue metoprolol 25 mg/day        4.   Anxiety state    Gets 3-4 hours of sleep daily and has many stressors  at home with finances. Seen by psychiatrist who prescribes him Xanax. He takes this to go to sleep most nights.         6.   IBS (irritable bowel syndrome)- Constipation Predominant    Constipation type IBS.  -Encouraged fruits, vegetables, fiber with increased water intake        9.   Palpitations    Normal heart examination on my physical exam today with normal rate and rhythm. Has hx of bundle branch  block and anxiety. No syncope. -Continue metoprolol daily -Monitor      Other Visit Diagnoses     Wheezing    -  Primary   Relevant Medications   fluticasone (FLOVENT HFA) 110 MCG/ACT inhaler       Outpatient Encounter Medications as of 02/24/2021  Medication Sig   albuterol (PROVENTIL) (2.5 MG/3ML) 0.083% nebulizer solution Take 3 mLs (2.5 mg total) by nebulization every 6 (six) hours as needed for wheezing or shortness of breath.   albuterol (VENTOLIN HFA) 108 (90 Base) MCG/ACT inhaler Inhale 2 puffs into the lungs every 4 (four) hours as needed for wheezing or shortness of breath.   ALPRAZolam (XANAX) 1 MG tablet Take 0.5 mg by mouth at bedtime.   fluticasone (FLOVENT HFA) 110 MCG/ACT inhaler Inhale 1 puff into the lungs daily.   metoprolol succinate (TOPROL-XL) 50 MG 24 hr tablet Take 1 tablet (50 mg total) by mouth daily. Take with or immediately following a meal.   [DISCONTINUED] dicyclomine (BENTYL) 10 MG capsule Take 2 capsules (20 mg total) by mouth 4 (four) times daily -  before meals and at bedtime. Take 20-30 minutes before meals (Patient not taking: Reported on 05/26/2019)   No facility-administered encounter medications on file as of 02/24/2021.    Follow-up: Return in about 1 month (around 03/27/2021).   Gerrit Heck, MD

## 2021-02-25 LAB — CBC
Hematocrit: 47.5 % (ref 37.5–51.0)
Hemoglobin: 15.9 g/dL (ref 13.0–17.7)
MCH: 28.5 pg (ref 26.6–33.0)
MCHC: 33.5 g/dL (ref 31.5–35.7)
MCV: 85 fL (ref 79–97)
Platelets: 187 10*3/uL (ref 150–450)
RBC: 5.58 x10E6/uL (ref 4.14–5.80)
RDW: 12.2 % (ref 11.6–15.4)
WBC: 6.8 10*3/uL (ref 3.4–10.8)

## 2021-02-25 LAB — COMPREHENSIVE METABOLIC PANEL
ALT: 14 IU/L (ref 0–44)
AST: 14 IU/L (ref 0–40)
Albumin/Globulin Ratio: 2.1 (ref 1.2–2.2)
Albumin: 4.7 g/dL (ref 3.8–4.9)
Alkaline Phosphatase: 91 IU/L (ref 44–121)
BUN/Creatinine Ratio: 16 (ref 9–20)
BUN: 17 mg/dL (ref 6–24)
Bilirubin Total: 0.3 mg/dL (ref 0.0–1.2)
CO2: 25 mmol/L (ref 20–29)
Calcium: 9.3 mg/dL (ref 8.7–10.2)
Chloride: 105 mmol/L (ref 96–106)
Creatinine, Ser: 1.08 mg/dL (ref 0.76–1.27)
Globulin, Total: 2.2 g/dL (ref 1.5–4.5)
Glucose: 97 mg/dL (ref 70–99)
Potassium: 4.7 mmol/L (ref 3.5–5.2)
Sodium: 142 mmol/L (ref 134–144)
Total Protein: 6.9 g/dL (ref 6.0–8.5)
eGFR: 81 mL/min/{1.73_m2} (ref >59)

## 2021-02-25 LAB — LIPID PANEL
Chol/HDL Ratio: 3.6 ratio (ref 0.0–5.0)
Cholesterol, Total: 183 mg/dL (ref 100–199)
HDL: 51 mg/dL (ref >39)
LDL Chol Calc (NIH): 109 mg/dL — ABNORMAL HIGH (ref 0–99)
Triglycerides: 127 mg/dL (ref 0–149)
VLDL Cholesterol Cal: 23 mg/dL (ref 5–40)

## 2021-02-28 ENCOUNTER — Other Ambulatory Visit: Payer: Self-pay | Admitting: Student

## 2021-02-28 ENCOUNTER — Encounter: Payer: Self-pay | Admitting: Student

## 2021-02-28 ENCOUNTER — Telehealth: Payer: Self-pay | Admitting: Student

## 2021-02-28 DIAGNOSIS — E7800 Pure hypercholesterolemia, unspecified: Secondary | ICD-10-CM

## 2021-02-28 MED ORDER — ATORVASTATIN CALCIUM 40 MG PO TABS: 40.0000 mg | ORAL_TABLET | Freq: Every day | ORAL | 3 refills | Status: DC

## 2021-02-28 NOTE — Telephone Encounter (Signed)
Called pt no answer °

## 2021-02-28 NOTE — Telephone Encounter (Signed)
Called patient x2 about lab results no answer and left VM to call clinic back. Will mychart message patient.

## 2021-03-02 ENCOUNTER — Ambulatory Visit: Payer: Medicaid Other | Admitting: Pharmacist

## 2021-03-17 ENCOUNTER — Other Ambulatory Visit: Payer: Self-pay

## 2021-03-18 MED ORDER — ALBUTEROL SULFATE HFA 108 (90 BASE) MCG/ACT IN AERS: 2.0000 | INHALATION_SPRAY | RESPIRATORY_TRACT | 0 refills | Status: DC | PRN

## 2021-03-20 ENCOUNTER — Other Ambulatory Visit: Payer: Self-pay

## 2021-03-20 DIAGNOSIS — R002 Palpitations: Secondary | ICD-10-CM

## 2021-03-20 NOTE — Telephone Encounter (Signed)
Patient calls nurse line reporting he is out of Metoprolol. Patient reports he requested a refill last week, however this was denied by the provider. Patient is requesting a refill on this or if he is not supposed to be on it anymore. Please advise.

## 2021-03-22 MED ORDER — METOPROLOL SUCCINATE ER 25 MG PO TB24: 25.0000 mg | ORAL_TABLET | Freq: Every day | ORAL | 1 refills | Status: DC

## 2021-03-22 NOTE — Telephone Encounter (Signed)
Had written in my previous note that patient has been taking 25 mg daily of metoprolol but got a request for 50 mg tablet of metoprolol. Called pharmacy and said last fill was February of the 50 mg dosage from their pharmacy. Called patient and he let me know he has been cutting the tablets in half as the 50 mg dosage made his heart rate really low and made him lightheaded. He says he has been taking 25 mg daily for a very long time. I ordered 25 mg metoprolol daily to be sent to pharmacy and discontinued the 50 mg order. Will monitor at future appointments

## 2021-05-05 ENCOUNTER — Ambulatory Visit: Payer: Medicaid Other | Admitting: Student

## 2021-05-09 ENCOUNTER — Other Ambulatory Visit: Payer: Self-pay

## 2021-05-09 ENCOUNTER — Ambulatory Visit (INDEPENDENT_AMBULATORY_CARE_PROVIDER_SITE_OTHER): Payer: Medicaid Other | Admitting: Student

## 2021-05-09 ENCOUNTER — Encounter: Payer: Self-pay | Admitting: Student

## 2021-05-09 VITALS — BP 126/75 | HR 79 | Ht 66.0 in | Wt 154.8 lb

## 2021-05-09 DIAGNOSIS — D171 Benign lipomatous neoplasm of skin and subcutaneous tissue of trunk: Secondary | ICD-10-CM

## 2021-05-09 DIAGNOSIS — R0602 Shortness of breath: Secondary | ICD-10-CM

## 2021-05-09 DIAGNOSIS — K581 Irritable bowel syndrome with constipation: Secondary | ICD-10-CM

## 2021-05-09 DIAGNOSIS — E7800 Pure hypercholesterolemia, unspecified: Secondary | ICD-10-CM

## 2021-05-09 NOTE — Progress Notes (Signed)
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 Has tolerated this medication well.  Shortness of breath Started daily Flovent 110 mcg daily at last visit. Patient has been taking albuterol daily and flovent as needed since he thought they were similar. Has not noticed a significant difference in breathing.Says he missed he pharmacy clinic for PFTs.  Abdominal Lump/Pain Says he has a lump near his epigastric region that is tender.Also says he has some lower abdominal pain after eating. Says his stool are sometimes hard and has BM every 1-2 days.Denies any hematochezia, hematemesis, vomiting, nausea, weight loss, fevers. Not a smoker. Does have IBS.  PERTINENT  PMH / PSH: anxiety  OBJECTIVE:   BP 126/75    Pulse 79    Ht 5\' 6"  (1.676 m)    Wt 154 lb 12.8 oz (70.2 kg)    SpO2 95%    BMI 24.99 kg/m   General: NAD, awake, alert, responsive to questions Head: Normocephalic atraumatic CV: Regular rate and rhythm no murmurs rubs or gallops Respiratory: Some wheezes in posterior lower lung fields bilaterally, no rales or crackles, chest rises symmetrically,  no increased work of breathing Abdomen: Soft, mildly tender to palpation, non-distended, normoactive bowel sounds. Soft rubbery mobile mass ~1in in epigastric region, non tender when palpation. Extremities: Moves upper and lower extremities freely, no edema in LE Neuro: No focal deficits ASSESSMENT/PLAN:   Hypercholesterolemia Has been adhering without issue -Continue atorva 40 daily. -LDL check today  Shortness of breath Educated patient that flovent should be taken daily and the albuterol should be taken as needed.  -Pt to schedule appointment for PFTs -F/u in a month to see how breathing is  IBS (irritable bowel syndrome)- Constipation Predominant Advised OTC  miralax, increased water consumption, and fiber intake.  Lipoma of abdominal wall Lump near his epigastric region that has cause him some pain. Soft rubbery mobile mass ~1 in in diameter.  -Monitor size progression and pain. Consider u/s if worsening    Gerrit Heck, MD [place]

## 2021-05-09 NOTE — Patient Instructions (Signed)
It was great to see you! Thank you for allowing me to participate in your care!   I recommend that you always bring your medications to each appointment as this makes it easy to ensure we are on the correct medications and helps [place] not miss when refills are needed.  Our plans for today:  - We will check your LDL level, continue taking your cholesterol medication atorvastatin - Please take miralax over the counter for constipation and stomach pain, if worsening please let me know. Your mskin change feels like fatty tissue but we will watch it and please let me know if it is growing. -Please take your fluticasone (flovent) daily and albuterol inhaler/nebulizer as needed for shortness of breath -Follow up in 4 weeks to see how you are doing  We are checking some labs today, I will call you if they are abnormal will send you a MyChart message or a letter if they are normal.  If you do not hear about your labs in the next 2 weeks please let us know.  Take care and seek immediate care sooner if you develop any concerns. Please remember to show up 15 minutes before your scheduled appointment time!  Gerrit Heck, MD [place]

## 2021-05-10 ENCOUNTER — Encounter: Payer: Self-pay | Admitting: Student

## 2021-05-10 LAB — LDL CHOLESTEROL, DIRECT: LDL Direct: 98 mg/dL (ref 0–99)

## 2021-05-10 NOTE — Assessment & Plan Note (Signed)
Lump near his epigastric region that has cause him some pain. Soft rubbery mobile mass ~1 in in diameter.  -Monitor size progression and pain. Consider u/s if worsening

## 2021-05-10 NOTE — Assessment & Plan Note (Signed)
Advised OTC miralax, increased water consumption, and fiber intake.

## 2021-05-10 NOTE — Assessment & Plan Note (Signed)
Educated patient that flovent should be taken daily and the albuterol should be taken as needed.  -Pt to schedule appointment for PFTs -F/u in a month to see how breathing is

## 2021-05-10 NOTE — Assessment & Plan Note (Signed)
Has been adhering without issue -Continue atorva 40 daily. -LDL check today

## 2021-05-11 ENCOUNTER — Encounter (HOSPITAL_COMMUNITY): Payer: Self-pay | Admitting: Emergency Medicine

## 2021-05-11 ENCOUNTER — Other Ambulatory Visit: Payer: Self-pay

## 2021-05-11 ENCOUNTER — Ambulatory Visit (HOSPITAL_COMMUNITY)
Admission: EM | Admit: 2021-05-11 | Discharge: 2021-05-11 | Disposition: A | Discharge status: Home / Self Care | Payer: Medicaid Other | Attending: Student | Admitting: Student

## 2021-05-11 DIAGNOSIS — Z112 Encounter for screening for other bacterial diseases: Secondary | ICD-10-CM | POA: Insufficient documentation

## 2021-05-11 DIAGNOSIS — Z1152 Encounter for screening for COVID-19: Secondary | ICD-10-CM | POA: Insufficient documentation

## 2021-05-11 DIAGNOSIS — J029 Acute pharyngitis, unspecified: Secondary | ICD-10-CM | POA: Insufficient documentation

## 2021-05-11 LAB — SARS CORONAVIRUS 2 (TAT 6-24 HRS): SARS Coronavirus 2: NEGATIVE

## 2021-05-11 LAB — POCT RAPID STREP A, ED / UC: Streptococcus, Group A Screen (Direct): NEGATIVE

## 2021-05-11 MED ORDER — LIDOCAINE VISCOUS HCL 2 % MT SOLN: 15.0000 mL | OROMUCOSAL | 0 refills | Status: DC | PRN

## 2021-05-11 MED ORDER — PREDNISONE 20 MG PO TABS: 40.0000 mg | ORAL_TABLET | Freq: Every day | ORAL | 0 refills | Status: AC

## 2021-05-11 NOTE — ED Triage Notes (Signed)
Onset yesterday morning started having symptoms.  Patient has a sore throat, headache, and laryngitis  Patient had a routine exam on Tuesday at family practice.

## 2021-05-11 NOTE — ED Provider Notes (Signed)
Adam [name]    CSN: [id number] Arrival date & time: 05/11/21  1041      History   Chief Complaint Chief Complaint  Patient presents with   Sore Throat    HPI Adam [name] is a 56 y.o. male presenting with sore throat, headache. Medical history asthma in springtime per pt, uses albuterol- has used once today which is normal for him. Sore throat, hoarse voice x2 days. Denies cough, SOB, CP, fevers/chills, myalgias. Hasn't taken medications. Denies sick exposure.  HPI  Past Medical History:  Diagnosis Date   Anxiety state, unspecified    BACK PAIN, CHRONIC 06/09/2010   Qualifier: Diagnosis of  By: Ernestina Patches MD, Steven     Bilateral inguinal hernia 02/26/2011   Dental abscess 02/15/2011   Esophageal reflux    no meds. per pt.   Family history of stress    Generalized headaches    Hiatal hernia    pt. denies nerve/mucscle disease   Hypertension    IBS (irritable bowel syndrome)    Major depressive disorder, single episode, unspecified    Onychomycosis 01/18/2014   Palpitations    Panic disorder without agoraphobia    Prostatitis, unspecified    RBBB (right bundle branch block)    Sleep apnea    Unspecified cardiovascular disease    Visual disturbance     Patient Active Problem List   Diagnosis Date Noted   Hypercholesterolemia 02/28/2021   Healthcare maintenance 02/24/2021   Globus sensation 02/24/2021   Colon polyps 05/26/2019   Lipoma of abdominal wall 03/17/2014   IBS (irritable bowel syndrome)- Constipation Predominant 06/19/2011   Light-headedness 05/14/2011   Shortness of breath 01/30/2011   Fatigue 12/13/2010   Abdominal pain 10/20/2010   Orthopnea 07/31/2010   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   Allergic rhinitis, seasonal 08/25/2009   HYPOKALEMIA, HX OF 12/30/2008   Essential hypertension, benign 08/04/2007   LOW BACK PAIN, CHRONIC 08/04/2007   Palpitations 04/25/2007   Chest tightness 04/18/2007   Anxiety state 07/04/2006   Panic Disorder  07/04/2006    Past Surgical History:  Procedure Laterality Date   COLONOSCOPY  07/26/2005   normal   COLONOSCOPY  05/26/2019   6 small adenomas noted on this colonoscopy, repeat in 3 years for surveillance   ESOPHAGOGASTRODUODENOSCOPY  01/03/2005   normal   FRACTURE SURGERY  2000   left arm   HERNIA REPAIR  03/14/2011   BIH   INGUINAL HERNIA REPAIR  03/14/2011   Procedure: LAPAROSCOPIC BILATERAL INGUINAL HERNIA REPAIR;  Surgeon: Harl Bowie, MD;  Location: [facility];  Service: General;  Laterality: N/A;  with mesh   pyloric stenosis      testicle  56 years old   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 Authorizing Provider  lidocaine (XYLOCAINE) 2 % solution Use as directed 15 mLs in the mouth or throat every 4 (four) hours as needed for mouth pain. 05/11/21  Yes Hazel Sams, PA-C  predniSONE (DELTASONE) 20 MG tablet Take 2 tablets (40 mg total) by mouth daily for 5 days. Take with breakfast or lunch. Avoid NSAIDs (ibuprofen, etc) while taking this medication. 05/11/21 05/16/21 Yes Hazel Sams, PA-C  albuterol (PROVENTIL) (2.5 MG/3ML) 0.083% nebulizer solution Take 3 mLs (2.5 mg total) by nebulization every 6 (six) hours as needed for wheezing or shortness of breath. 12/28/20  Chase Picket, MD  albuterol (VENTOLIN HFA) 108 (90 Base) MCG/ACT inhaler Inhale 2 puffs into the lungs every 4 (four) hours as needed for wheezing or shortness of breath. 03/18/21   Gerrit Heck, MD  ALPRAZolam Duanne Moron) 1 MG tablet Take 0.5 mg by mouth at bedtime.    [provider]  atorvastatin (LIPITOR) 40 MG tablet Take 1 tablet (40 mg total) by mouth daily. 02/28/21   Gerrit Heck, MD  fluticasone (FLOVENT HFA) 110 MCG/ACT inhaler Inhale 1 puff into the lungs daily. 02/24/21   Gerrit Heck, MD  metoprolol succinate (TOPROL XL) 25 MG 24 hr tablet Take 1 tablet (25  mg total) by mouth daily. 03/22/21   Gerrit Heck, MD  dicyclomine (BENTYL) 10 MG capsule Take 2 capsules (20 mg total) by mouth 4 (four) times daily -  before meals and at bedtime. Take 20-30 minutes before meals Patient not taking: Reported on 05/26/2019 05/20/19 06/26/20  Levin Erp, PA    Family History Family History  Problem Relation Age of Onset   Heart attack Mother 76       PTCA  and 3 stents   Heart disease Mother    COPD Mother    Anxiety disorder Mother    High blood pressure Mother    Kidney disease Mother    Drug abuse Brother        42 passed away from xanax   Colon cancer Brother        passed away at 69   Hodgkin's lymphoma Maternal Grandmother        27   Heart disease Maternal Grandfather    Pyloric stenosis Daughter 105       X2   Bipolar disorder Daughter    Cancer Maternal Aunt        pt unaware of what kind   Cancer Maternal Uncle        pt unaware of what kind   Anxiety disorder Other    Esophageal cancer Neg Hx    Rectal cancer Neg Hx    Stomach cancer Neg Hx     Social History Social History   Tobacco Use   Smoking status: Never    Passive exposure: Past   Smokeless tobacco: Never  Vaping Use   Vaping Use: Never used  Substance Use Topics   Alcohol use: No   Drug use: No     Allergies   Hctz [hydrochlorothiazide] and Penicillins   Review of Systems Review of Systems  Constitutional:  Negative for appetite change, chills and fever.  HENT:  Positive for sore throat. Negative for congestion, ear pain, rhinorrhea, sinus pressure and sinus pain.   Eyes:  Negative for redness and visual disturbance.  Respiratory:  Negative for cough, chest tightness, shortness of breath and wheezing.   Cardiovascular:  Negative for chest pain and palpitations.  Gastrointestinal:  Negative for abdominal pain, constipation, diarrhea, nausea and vomiting.  Genitourinary:  Negative for dysuria, frequency and urgency.  Musculoskeletal:   Negative for myalgias.  Neurological:  Negative for dizziness, weakness and headaches.  Psychiatric/Behavioral:  Negative for confusion.   All other systems reviewed and are negative.   Physical Exam Triage Vital Signs ED Triage Vitals  Enc Vitals Group     BP      Pulse      Resp      Temp      Temp src      SpO2      Weight  Height      Head Circumference      Peak Flow      Pain Score      Pain Loc      Pain Edu?      Excl. in [place]?    No data found.  Updated Vital Signs BP 138/86 (BP Location: Left Arm)    Pulse 73    Temp 98.6 F (37 C) (Oral)    Resp 18    SpO2 96%   Visual Acuity Right Eye Distance:   Left Eye Distance:   Bilateral Distance:    Right Eye Near:   Left Eye Near:    Bilateral Near:     Physical Exam Vitals reviewed.  Constitutional:      General: He is not in acute distress.    Appearance: Normal appearance. He is not ill-appearing.  HENT:     Head: Normocephalic and atraumatic.     Right Ear: Tympanic membrane, ear canal and external ear normal. No tenderness. No middle ear effusion. There is no impacted cerumen. Tympanic membrane is not perforated, erythematous, retracted or bulging.     Left Ear: Tympanic membrane, ear canal and external ear normal. No tenderness.  No middle ear effusion. There is no impacted cerumen. Tympanic membrane is not perforated, erythematous, retracted or bulging.     Nose: Nose normal. No congestion.     Mouth/Throat:     Mouth: Mucous membranes are moist.     Pharynx: Uvula midline. Posterior oropharyngeal erythema present. No oropharyngeal exudate.     Tonsils: No tonsillar exudate.     Comments: Hoarse voice. Smooth erythema posterior pharynx. Tonsils not visible. On exam, uvula is midline, she is tolerating her secretions without difficulty, there is no trismus, no drooling, she has normal phonation  Eyes:     Extraocular Movements: Extraocular movements intact.     Pupils: Pupils are equal, round, and  reactive to light.  Cardiovascular:     Rate and Rhythm: Normal rate and regular rhythm.     Heart sounds: Normal heart sounds.  Pulmonary:     Effort: Pulmonary effort is normal.     Breath sounds: Normal breath sounds. No decreased breath sounds, wheezing, rhonchi or rales.  Abdominal:     Palpations: Abdomen is soft.     Tenderness: There is no abdominal tenderness. There is no guarding or rebound.  Lymphadenopathy:     Cervical: No cervical adenopathy.     Right cervical: No superficial cervical adenopathy.    Left cervical: No superficial cervical adenopathy.  Neurological:     General: No focal deficit present.     Mental Status: He is alert and oriented to person, place, and time.  Psychiatric:        Mood and Affect: Mood normal.        Behavior: Behavior normal.        Thought Content: Thought content normal.        Judgment: Judgment normal.     UC Treatments / Results  Labs (all labs ordered are listed, but only abnormal results are displayed) Labs Reviewed  CULTURE, GROUP A STREP ([place])  SARS CORONAVIRUS 2 (TAT 6-24 HRS)  POCT RAPID STREP A, ED / UC    EKG   Radiology No results found.  Procedures Procedures (including critical care time)  Medications Ordered in UC Medications - No data to display  Initial Impression / Assessment and Plan / UC Course  I have reviewed the triage  vital signs and the nursing notes.  Pertinent labs & imaging results that were available during my care of the patient were reviewed by me and considered in my medical decision making (see chart for details).     This patient is a very pleasant 56 y.o. year old male presenting with viral pharyngitis. Today this pt is afebrile nontachycardic nontachypneic, oxygenating well on room air, no wheezes rhonchi or rales. Asthma controlled on albuterol inhaler, continue this, declines refills.   Rapid strep negative, culture sent . Covid PCR sent.   Viscous lidocaine, low-dose  prednisone sent.   ED return precautions discussed. Patient verbalizes understanding and agreement.    Final Clinical Impressions(s) / UC Diagnoses   Final diagnoses:  Viral pharyngitis  Screening for streptococcal infection  Encounter for screening for COVID-19     Discharge Instructions      -For sore throat, use lidocaine mouthwash up to every 4 hours. Make sure not to eat for at least 1 hour after using this, as your mouth will be very numb and you could bite yourself. -Prednisone, 2 pills taken at the same time for 5 days in a row.  Try taking this earlier in the day as it can give you energy. Avoid NSAIDs like ibuprofen and alleve while taking this medication as they can increase your risk of stomach upset and even GI bleeding when in combination with a steroid. You can continue tylenol (acetaminophen) up to 1000mg  3x daily. -With a virus, you're typically contagious for 5-7 days, or as long as you're having fevers.       ED Prescriptions     Medication Sig Dispense Auth. Provider   lidocaine (XYLOCAINE) 2 % solution Use as directed 15 mLs in the mouth or throat every 4 (four) hours as needed for mouth pain. 100 mL Hazel Sams, PA-C   predniSONE (DELTASONE) 20 MG tablet Take 2 tablets (40 mg total) by mouth daily for 5 days. Take with breakfast or lunch. Avoid NSAIDs (ibuprofen, etc) while taking this medication. 10 tablet Hazel Sams, PA-C      PDMP not reviewed this encounter.   Hazel Sams, PA-C 05/11/21 1249

## 2021-05-11 NOTE — ED Notes (Signed)
Strep swab in lab 

## 2021-05-11 NOTE — Discharge Instructions (Addendum)
-  For sore throat, use lidocaine mouthwash up to every 4 hours. Make sure not to eat for at least 1 hour after using this, as your mouth will be very numb and you could bite yourself. -Prednisone, 2 pills taken at the same time for 5 days in a row.  Try taking this earlier in the day as it can give you energy. Avoid NSAIDs like ibuprofen and alleve while taking this medication as they can increase your risk of stomach upset and even GI bleeding when in combination with a steroid. You can continue tylenol (acetaminophen) up to 1000mg  3x daily. -With a virus, you're typically contagious for 5-7 days, or as long as you're having fevers.

## 2021-05-12 ENCOUNTER — Other Ambulatory Visit: Payer: Self-pay | Admitting: Student

## 2021-05-12 DIAGNOSIS — R0602 Shortness of breath: Secondary | ICD-10-CM

## 2021-05-13 ENCOUNTER — Other Ambulatory Visit: Payer: Self-pay | Admitting: Student

## 2021-05-13 ENCOUNTER — Encounter: Payer: Self-pay | Admitting: Student

## 2021-05-13 LAB — CULTURE, GROUP A STREP (THRC)

## 2021-06-12 NOTE — Progress Notes (Signed)
° ° °  SUBJECTIVE:   CHIEF COMPLAINT / HPI:   Shortness of Breath Taking flovent daily (sometimes missed doses) and albuterol prn. Was unable/able to go to pharmacy appoiuntment for PFTs and currently not interested in getting this done.  Sciatica R Sided R leg pain that shoots down from back across buttocks and down to foot.  He says that this is chronic and sometimes flares up.  Says that this has been going on for 1-2 weeks.  He is not wanting to do physcial therapy but has been stretching at home without much relief.  That he works at [employer] and carries heavy loads daily.  Denies any saddle anesthesia or fecal/urinary incontinence.  Denies any calf swelling or pain when touching.  PERTINENT  PMH / PSH: HLD, HTN, IBS, Panic Disorder  OBJECTIVE:   BP 125/76    Pulse 68    Ht 5\' 6"  (1.676 m)    Wt 160 lb 9.6 oz (72.8 kg)    SpO2 99%    BMI 25.92 kg/m   Gen: NAD, alert and responsive to questions Extremities: No calf swelling or erythema, nontender to palpation no pain with straight leg testing, but some pain with hip flexion/extension, no lower extremity edema, gait normal CV: RRR no murmurs rubs or gallops Lungs: CTAB no w/r/c  ASSESSMENT/PLAN:   Sciatica of right side No alarm symptoms mostly complaining of neuropathic pain. -Capsaicin OTC -Gabapentin 100 mg daily -Encourage stretching  Shortness of breath Feels that it is improved, does not want to get PFTs tested currently -Continue Flovent daily -Continue albuterol as needed   Gerrit Heck, MD [place]

## 2021-06-12 NOTE — Patient Instructions (Signed)
It was great to see you! Thank you for allowing me to participate in your care!   I recommend that you always bring your medications to each appointment as this makes it easy to ensure we are on the correct medications and helps [place] not miss when refills are needed.  Our plans for today:  - I have prescribed gabapentin 100 mg daily for your sciatica pain. Capsaicin cream is also helpful for your feet which you can get OTC - Please let us know if this help! -continue taking flovent daily and albuterol as needed for your breathing  Take care and seek immediate care sooner if you develop any concerns. Please remember to show up 15 minutes before your scheduled appointment time!  Gerrit Heck, MD [place]

## 2021-06-15 ENCOUNTER — Other Ambulatory Visit: Payer: Self-pay

## 2021-06-15 ENCOUNTER — Ambulatory Visit: Payer: Medicaid Other | Admitting: Student

## 2021-06-15 ENCOUNTER — Encounter: Payer: Self-pay | Admitting: Student

## 2021-06-15 VITALS — BP 125/76 | HR 68 | Ht 66.0 in | Wt 160.6 lb

## 2021-06-15 DIAGNOSIS — M5431 Sciatica, right side: Secondary | ICD-10-CM

## 2021-06-15 DIAGNOSIS — R0602 Shortness of breath: Secondary | ICD-10-CM | POA: Diagnosis not present

## 2021-06-15 MED ORDER — GABAPENTIN 100 MG PO CAPS: 100.0000 mg | ORAL_CAPSULE | Freq: Every day | ORAL | 0 refills | Status: DC

## 2021-06-15 NOTE — Assessment & Plan Note (Signed)
Feels that it is improved, does not want to get PFTs tested currently -Continue Flovent daily -Continue albuterol as needed

## 2021-06-15 NOTE — Assessment & Plan Note (Signed)
No alarm symptoms mostly complaining of neuropathic pain. -Capsaicin OTC -Gabapentin 100 mg daily -Encourage stretching

## 2021-06-28 ENCOUNTER — Other Ambulatory Visit (HOSPITAL_COMMUNITY): Payer: Self-pay

## 2021-07-03 ENCOUNTER — Other Ambulatory Visit (HOSPITAL_COMMUNITY): Payer: Self-pay

## 2021-08-09 ENCOUNTER — Ambulatory Visit (HOSPITAL_COMMUNITY)
Admission: EM | Admit: 2021-08-09 | Discharge: 2021-08-09 | Disposition: A | Discharge status: Home / Self Care | Payer: Medicaid Other | Attending: Family Medicine | Admitting: Family Medicine

## 2021-08-09 ENCOUNTER — Encounter (HOSPITAL_COMMUNITY): Payer: Self-pay

## 2021-08-09 DIAGNOSIS — M545 Low back pain, unspecified: Secondary | ICD-10-CM | POA: Diagnosis not present

## 2021-08-09 DIAGNOSIS — R0602 Shortness of breath: Secondary | ICD-10-CM | POA: Diagnosis present

## 2021-08-09 DIAGNOSIS — J4521 Mild intermittent asthma with (acute) exacerbation: Secondary | ICD-10-CM | POA: Diagnosis not present

## 2021-08-09 DIAGNOSIS — R1012 Left upper quadrant pain: Secondary | ICD-10-CM | POA: Diagnosis present

## 2021-08-09 LAB — CBC WITH DIFFERENTIAL/PLATELET
Abs Immature Granulocytes: 0.02 10*3/uL (ref 0.00–0.07)
Basophils Absolute: 0.1 10*3/uL (ref 0.0–0.1)
Basophils Relative: 1 %
Eosinophils Absolute: 0.4 10*3/uL (ref 0.0–0.5)
Eosinophils Relative: 6 %
HCT: 49.1 % (ref 39.0–52.0)
Hemoglobin: 16.5 g/dL (ref 13.0–17.0)
Immature Granulocytes: 0 %
Lymphocytes Relative: 27 %
Lymphs Abs: 1.9 10*3/uL (ref 0.7–4.0)
MCH: 28.6 pg (ref 26.0–34.0)
MCHC: 33.6 g/dL (ref 30.0–36.0)
MCV: 85.1 fL (ref 80.0–100.0)
Monocytes Absolute: 0.6 10*3/uL (ref 0.1–1.0)
Monocytes Relative: 8 %
Neutro Abs: 4 10*3/uL (ref 1.7–7.7)
Neutrophils Relative %: 58 %
Platelets: 190 10*3/uL (ref 150–400)
RBC: 5.77 MIL/uL (ref 4.22–5.81)
RDW: 12.1 % (ref 11.5–15.5)
WBC: 6.9 10*3/uL (ref 4.0–10.5)
nRBC: 0 % (ref 0.0–0.2)

## 2021-08-09 LAB — COMPREHENSIVE METABOLIC PANEL
ALT: 15 U/L (ref 0–44)
AST: 21 U/L (ref 15–41)
Albumin: 4.1 g/dL (ref 3.5–5.0)
Alkaline Phosphatase: 86 U/L (ref 38–126)
Anion gap: 6 (ref 5–15)
BUN: 13 mg/dL (ref 6–20)
CO2: 29 mmol/L (ref 22–32)
Calcium: 9.3 mg/dL (ref 8.9–10.3)
Chloride: 105 mmol/L (ref 98–111)
Creatinine, Ser: 1.06 mg/dL (ref 0.61–1.24)
GFR, Estimated: >60 mL/min (ref >60)
Glucose, Bld: 127 mg/dL — ABNORMAL HIGH (ref 70–99)
Potassium: 5 mmol/L (ref 3.5–5.1)
Sodium: 140 mmol/L (ref 135–145)
Total Bilirubin: 1 mg/dL (ref 0.3–1.2)
Total Protein: 7.1 g/dL (ref 6.5–8.1)

## 2021-08-09 LAB — POCT URINALYSIS DIPSTICK, ED / UC
Bilirubin Urine: NEGATIVE
Glucose, UA: NEGATIVE mg/dL
Hgb urine dipstick: NEGATIVE
Ketones, ur: NEGATIVE mg/dL
Leukocytes,Ua: NEGATIVE
Nitrite: NEGATIVE
Protein, ur: NEGATIVE mg/dL
Specific Gravity, Urine: 1.015 (ref 1.005–1.030)
Urobilinogen, UA: 0.2 mg/dL (ref 0.0–1.0)
pH: 5.5 (ref 5.0–8.0)

## 2021-08-09 LAB — LIPASE, BLOOD: Lipase: 42 U/L (ref 11–51)

## 2021-08-09 MED ORDER — ALBUTEROL SULFATE HFA 108 (90 BASE) MCG/ACT IN AERS: 2.0000 | INHALATION_SPRAY | RESPIRATORY_TRACT | 1 refills | Status: DC | PRN

## 2021-08-09 MED ORDER — PREDNISONE 20 MG PO TABS: 40.0000 mg | ORAL_TABLET | Freq: Every day | ORAL | 0 refills | Status: DC

## 2021-08-09 NOTE — Discharge Instructions (Addendum)
You have had labs (blood work) drawn today. We will call you with any significant abnormalities or if there is need to begin or change treatment or pursue further follow up. ? ?You may also review your test results online through [facility]. If you do not have a MyChart account, instructions to sign up should be on your discharge paperwork. ? ?You have been seen today for abdominal and back pain. Your evaluation was not suggestive of any emergent condition requiring medical intervention at this time. However, some abdominal or back  problems make take more time to appear. Therefore, it is very important for you to pay attention to any new symptoms or worsening of your current condition. ? ?Please return here or to the Emergency Department immediately should you begin to feel worse in any way or have any of the following symptoms: increasing or different abdominal pain, persistent vomiting, inability to drink fluids, fevers, or shaking chills.  ? ? ?

## 2021-08-09 NOTE — ED Provider Notes (Signed)
?[name] ? ? ?[id number] ?08/09/21 Arrival Time: 1497 ? ?ASSESSMENT & PLAN: ? ?1. Mild intermittent asthma with acute exacerbation   ?2. Shortness of breath   ?3. Left upper quadrant abdominal pain   ?4. Acute left-sided low back pain without sciatica   ? ?No resp distress. ?Benign abdominal exam. No indications for urgent abdominal/pelvic imaging at this time. Discussed. ? ?CBC, CMP, lipase pending. Will notify of any significant abn results. ? ?Begin: ?Meds ordered this encounter  ?Medications  ? predniSONE (DELTASONE) 20 MG tablet  ?  Sig: Take 2 tablets (40 mg total) by mouth daily.  ?  Dispense:  10 tablet  ?  Refill:  0  ? albuterol (VENTOLIN HFA) 108 (90 Base) MCG/ACT inhaler  ?  Sig: Inhale 2 puffs into the lungs every 4 (four) hours as needed for wheezing or shortness of breath.  ?  Dispense:  18 each  ?  Refill:  1  ? ? ? ?Discharge Instructions   ? ?  ?You have had labs (blood work) drawn today. We will call you with any significant abnormalities or if there is need to begin or change treatment or pursue further follow up. ? ?You may also review your test results online through [facility]. If you do not have a MyChart account, instructions to sign up should be on your discharge paperwork. ? ?You have been seen today for abdominal and back pain. Your evaluation was not suggestive of any emergent condition requiring medical intervention at this time. However, some abdominal or back  problems make take more time to appear. Therefore, it is very important for you to pay attention to any new symptoms or worsening of your current condition. ? ?Please return here or to the Emergency Department immediately should you begin to feel worse in any way or have any of the following symptoms: increasing or different abdominal pain, persistent vomiting, inability to drink fluids, fevers, or shaking chills.  ? ? ? ? ? Follow-up Information   ? ? Gerrit Heck, MD.   ?Specialty: Family Medicine ?Why: As  needed. ?Contact information: ?[place] [address] ?[phone] ? ? ?  ?  ? ?  ?  ? ?  ? ?For bradycardia I have advised him to cut metoprolol dose in half for the next week. ? ?Reviewed expectations re: course of current medical issues. Questions answered. ?Outlined signs and symptoms indicating need for more acute intervention. ?Patient verbalized understanding. ?After Visit Summary given. ? ? ?SUBJECTIVE: ?History from: patient. ?Adam [name] is a 56 y.o. male who presents with complaint of intermittent low back pain and LUQ pain; intermittent; over past two weeks; no specific aggravating or alleviating factors reported. Normal PO intake without n/v/d. Pain does not wake him from sleep. Passing gas. No blood in stool. Normal urination. Ambulatory. No extremity sensation changes or weakness.  ?Also reports asthma exacerbation; this week; unclear trigger. Wheezing more than usual; questions allergy related. ? ?Past Surgical History:  ?Procedure Laterality Date  ? COLONOSCOPY  07/26/2005  ? normal  ? COLONOSCOPY  05/26/2019  ? 6 small adenomas noted on this colonoscopy, repeat in 3 years for surveillance  ? ESOPHAGOGASTRODUODENOSCOPY  01/03/2005  ? normal  ? FRACTURE SURGERY  2000  ? left arm  ? HERNIA REPAIR  03/14/2011  ? BIH  ? INGUINAL HERNIA REPAIR  03/14/2011  ? Procedure: LAPAROSCOPIC BILATERAL INGUINAL HERNIA REPAIR;  Surgeon: Harl Bowie, MD;  Location: [facility];  Service: General;  Laterality: N/A;  with mesh  ? pyloric stenosis     ? testicle  56 years old  ? 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 ? ? ?OBJECTIVE: ? ?Vitals:  ? 08/09/21 1231  ?BP: (!) 152/99  ?Pulse: (!) 52  ?Resp: 18  ?Temp: 97.7 ?F (36.5 ?C)  ?TempSrc: Oral  ?SpO2: 97%  ?  ?Slightly lower pulse than normal noted. ? ?General appearance: alert, oriented, no acute distress ?HEENT: Adam [name]; AT; oropharynx moist ?Lungs: unlabored respirations; mild to moderate bilateral exp wheezing present ?Abdomen:  soft; without distention; no specific tenderness to palpation; normal bowel sounds; without masses or organomegaly; without guarding or rebound tenderness ?Back: without reported CVA tenderness; FROM at waist ?Extremities: without LE edema; symmetrical; without gross deformities ?Skin: warm and dry ?Neurologic: normal gait ?Psychological: alert and cooperative; normal mood and affect ? ? ?Allergies  ?Allergen Reactions  ? Hctz [Hydrochlorothiazide] Other (See Comments)  ?  Per wife, pt developed joint pain and swelling (esp shoulders and elbows)  ?  ? Penicillins Other (See Comments)  ?  Pt has family history of allergy but does not remember ever taking this  ? ?                                            ?Past Medical History:  ?Diagnosis Date  ? Anxiety state, unspecified   ? BACK PAIN, CHRONIC 06/09/2010  ? Qualifier: Diagnosis of  By: Ernestina Patches MD, Remo Lipps    ? Bilateral inguinal hernia 02/26/2011  ? Dental abscess 02/15/2011  ? Esophageal reflux   ? no meds. per pt.  ? Family history of stress   ? Generalized headaches   ? Hiatal hernia   ? pt. denies nerve/mucscle disease  ? Hypertension   ? IBS (irritable bowel syndrome)   ? Major depressive disorder, single episode, unspecified   ? Onychomycosis 01/18/2014  ? Palpitations   ? Panic disorder without agoraphobia   ? Prostatitis, unspecified   ? RBBB (right bundle branch block)   ? Sleep apnea   ? Unspecified cardiovascular disease   ? Visual disturbance   ? ? ?Social History  ? ?Socioeconomic History  ? Marital status: Married  ?  Spouse name: Not on file  ? Number of children: 9  ? Years of education: Not on file  ? Highest education level: Not on file  ?Occupational History  ? Occupation: Horticulturist, commercial  ?  Employer: [employer]  ?Tobacco Use  ? Smoking status: Never  ?  Passive exposure: Past  ? Smokeless tobacco: Never  ?Vaping Use  ? Vaping Use: Never used  ?Substance and Sexual Activity  ? Alcohol use: No  ? Drug use: No  ? Sexual activity: Yes  ?  Partners: Female   ?Other Topics Concern  ? Not on file  ?Social History Narrative  ? 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  He has nine children, 2 from first marriage, and 7 from [address] Lives with wife 89 and 3 sons 142 14 and 273years old. Denies smoking, EtOH, recreational drugs, + remote h/o marijuana use when he was in his early 89s Owns a car, does not exercise regularly. Fishing, boating, and camping for fun. Stressors are financial but can afford food and housing.  ? ?Social Determinants of Health  ? ?Financial Resource Strain: Not on file  ?Food  Insecurity: Not on file  ?Transportation Needs: Not on file  ?Physical Activity: Not on file  ?Stress: Not on file  ?Social Connections: Not on file  ?Intimate Partner Violence: Not on file  ? ? ?Family History  ?Problem Relation Age of Onset  ? Heart attack Mother 37  ?     PTCA  and 3 stents  ? Heart disease Mother   ? COPD Mother   ? Anxiety disorder Mother   ? High blood pressure Mother   ? Kidney disease Mother   ? Drug abuse Brother   ?     36 passed away from xanax  ? Colon cancer Brother   ?     passed away at 35  ? Hodgkin's lymphoma Maternal Grandmother   ?     62  ? Heart disease Maternal Grandfather   ? Pyloric stenosis Daughter 23  ?     X2  ? Bipolar disorder Daughter   ? Cancer Maternal Aunt   ?     pt unaware of what kind  ? Cancer Maternal Uncle   ?     pt unaware of what kind  ? Anxiety disorder Other   ? Esophageal cancer Neg Hx   ? Rectal cancer Neg Hx   ? Stomach cancer Neg Hx   ? ?  ?Vanessa Kick, MD ?08/09/21 1635 ? ?

## 2021-08-09 NOTE — ED Triage Notes (Signed)
2 week h/o left sided low back pain that radiates to his abdomen. No meds taken. Notes some constipation. No emesis, diarrhea or urinary sxs. No falls or injuries. ?

## 2021-08-24 ENCOUNTER — Ambulatory Visit: Payer: Medicaid Other | Admitting: Student

## 2021-08-24 NOTE — Progress Notes (Deleted)
    SUBJECTIVE:   CHIEF COMPLAINT / HPI:   L sided abdominal pain GI***  PERTINENT  PMH / PSH: IBS  OBJECTIVE:   There were no vitals taken for this visit.  ***  ASSESSMENT/PLAN:   No problem-specific Assessment & Plan notes found for this encounter.     Gerrit Heck, MD [facility]

## 2021-10-01 ENCOUNTER — Other Ambulatory Visit: Payer: Self-pay | Admitting: Student

## 2021-10-01 DIAGNOSIS — R002 Palpitations: Secondary | ICD-10-CM

## 2021-10-03 ENCOUNTER — Encounter (HOSPITAL_COMMUNITY): Payer: Self-pay

## 2021-10-03 ENCOUNTER — Ambulatory Visit (HOSPITAL_COMMUNITY)
Admission: EM | Admit: 2021-10-03 | Discharge: 2021-10-03 | Disposition: A | Discharge status: Home / Self Care | Payer: Medicaid Other | Attending: Family Medicine | Admitting: Family Medicine

## 2021-10-03 DIAGNOSIS — G8929 Other chronic pain: Secondary | ICD-10-CM | POA: Diagnosis not present

## 2021-10-03 DIAGNOSIS — M5441 Lumbago with sciatica, right side: Secondary | ICD-10-CM

## 2021-10-03 MED ORDER — PREDNISONE 20 MG PO TABS: 40.0000 mg | ORAL_TABLET | Freq: Every day | ORAL | 0 refills | Status: DC

## 2021-10-03 NOTE — ED Provider Notes (Signed)
[facility]   [id number] 10/03/21 Arrival Time: 2831  ASSESSMENT & PLAN:  1. Chronic right-sided low back pain with right-sided sciatica    Able to ambulate here and hemodynamically stable. No indication for imaging of back at this time given no trauma and normal neurological exam. Discussed.  Meds ordered this encounter  Medications   predniSONE (DELTASONE) 20 MG tablet    Sig: Take 2 tablets (40 mg total) by mouth daily.    Dispense:  14 tablet    Refill:  0   Work note provided; light duty. Encourage ROM/movement as tolerated.  Recommend:  Follow-up Information     Gerrit Heck, MD.   Specialty: Family Medicine Why: If worsening or failing to improve as anticipated. Contact information: [place] [phone]                 Reviewed expectations re: course of current medical issues. Questions answered. Outlined signs and symptoms indicating need for more acute intervention. Patient verbalized understanding. After Visit Summary given.   SUBJECTIVE: History from: patient. Adam [name] is a 56 y.o. male who presents with complaint of acute on chronic R pack pain. Current exacerbation over past week. With radiation of pain down R leg. Occas "tingling" feeling. Ambulatory. No trauma. Ibuprofen without much help.    OBJECTIVE:  Vitals:   10/03/21 1548  BP: (!) 146/91  Pulse: (!) 52  Resp: 18  Temp: 98.4 F (36.9 C)  TempSrc: Oral  SpO2: 100%    General appearance: alert; no distress HEENT: [place]; AT Neck: supple with FROM; without midline tenderness Back: mild  and poorly localized tenderness to palpation over R lumbar paraspinal musculature extending to buttock ; FROM at waist Extremities: without edema; symmetrical without gross deformities; normal ROM of all extremities Skin: warm and dry Neurologic: normal gait Psychological: alert and cooperative; normal mood and affect   Allergies  Allergen  Reactions   Hctz [Hydrochlorothiazide] Other (See Comments)    Per wife, pt developed joint pain and swelling (esp shoulders and elbows)     Penicillins Other (See Comments)    Pt has family history of allergy but does not remember ever taking this    Past Medical History:  Diagnosis Date   Anxiety state, unspecified    BACK PAIN, CHRONIC 06/09/2010   Qualifier: Diagnosis of  By: Ernestina Patches MD, Steven     Bilateral inguinal hernia 02/26/2011   Dental abscess 02/15/2011   Esophageal reflux    no meds. per pt.   Family history of stress    Generalized headaches    Hiatal hernia    pt. denies nerve/mucscle disease   Hypertension    IBS (irritable bowel syndrome)    Major depressive disorder, single episode, unspecified    Onychomycosis 01/18/2014   Palpitations    Panic disorder without agoraphobia    Prostatitis, unspecified    RBBB (right bundle branch block)    Sleep apnea    Unspecified cardiovascular disease    Visual disturbance    Social History   Socioeconomic History   Marital status: Married    Spouse name: Not on file   Number of children: 9   Years of education: Not on file   Highest education level: Not on file  Occupational History   Occupation: Horticulturist, commercial    Employer: [employer]  Tobacco Use   Smoking status: Never    Passive exposure: Past   Smokeless tobacco: Never  Vaping Use  Vaping Use: Never used  Substance and Sexual Activity   Alcohol use: No   Drug use: No   Sexual activity: Yes    Partners: Female  Other Topics Concern   Not on file  Social History Narrative   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  He has nine children, 2 from first marriage, and 7 from [address] Lives with wife 435 and 3 sons 167 132 and 268years old. Denies smoking, EtOH, recreational drugs, + remote h/o marijuana use when he was in his early 89s Owns a car, does not exercise regularly. Fishing, boating, and camping for fun. Stressors are financial but can  afford food and housing.   Social Determinants of Health   Financial Resource Strain: Not on file  Food Insecurity: Not on file  Transportation Needs: Not on file  Physical Activity: Not on file  Stress: Not on file  Social Connections: Not on file  Intimate Partner Violence: Not on file   Family History  Problem Relation Age of Onset   Heart attack Mother 428      PTCA  and 3 stents   Heart disease Mother    COPD Mother    Anxiety disorder Mother    High blood pressure Mother    Kidney disease Mother    Drug abuse Brother        32passed away from xanax   Colon cancer Brother        passed away at 570  Hodgkin's lymphoma Maternal Grandmother        65   Heart disease Maternal Grandfather    Pyloric stenosis Daughter 219      X2   Bipolar disorder Daughter    Cancer Maternal Aunt        pt unaware of what kind   Cancer Maternal Uncle        pt unaware of what kind   Anxiety disorder Other    Esophageal cancer Neg Hx    Rectal cancer Neg Hx    Stomach cancer Neg Hx    Past Surgical History:  Procedure Laterality Date   COLONOSCOPY  07/26/2005   normal   COLONOSCOPY  05/26/2019   6 small adenomas noted on this colonoscopy, repeat in 3 years for surveillance   ESOPHAGOGASTRODUODENOSCOPY  01/03/2005   normal   FRACTURE SURGERY  2000   left arm   HERNIA REPAIR  03/14/2011   BIH   INGUINAL HERNIA REPAIR  03/14/2011   Procedure: LAPAROSCOPIC BILATERAL INGUINAL HERNIA REPAIR;  Surgeon: DHarl Bowie MD;  Location: [facility];  Service: General;  Laterality: N/A;  with mesh   pyloric stenosis      testicle  56years old   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

## 2021-10-03 NOTE — ED Triage Notes (Signed)
2 week h/o back pain that radiates to his left leg. He was seen for this last month.

## 2021-10-10 ENCOUNTER — Encounter: Payer: Self-pay | Admitting: *Deleted

## 2021-11-01 ENCOUNTER — Encounter (HOSPITAL_COMMUNITY): Payer: Self-pay | Admitting: Emergency Medicine

## 2021-11-01 ENCOUNTER — Ambulatory Visit (HOSPITAL_COMMUNITY)
Admission: EM | Admit: 2021-11-01 | Discharge: 2021-11-01 | Disposition: A | Discharge status: Home / Self Care | Payer: Medicaid Other | Attending: Family Medicine | Admitting: Family Medicine

## 2021-11-01 DIAGNOSIS — K0889 Other specified disorders of teeth and supporting structures: Secondary | ICD-10-CM

## 2021-11-01 DIAGNOSIS — J4521 Mild intermittent asthma with (acute) exacerbation: Secondary | ICD-10-CM

## 2021-11-01 DIAGNOSIS — R0602 Shortness of breath: Secondary | ICD-10-CM

## 2021-11-01 MED ORDER — HYDROCODONE-ACETAMINOPHEN 5-325 MG PO TABS: 1.0000 | ORAL_TABLET | Freq: Four times a day (QID) | ORAL | 0 refills | Status: DC | PRN

## 2021-11-01 MED ORDER — CLINDAMYCIN HCL 300 MG PO CAPS: 300.0000 mg | ORAL_CAPSULE | Freq: Three times a day (TID) | ORAL | 0 refills | Status: DC

## 2021-11-01 MED ORDER — ALBUTEROL SULFATE HFA 108 (90 BASE) MCG/ACT IN AERS: 2.0000 | INHALATION_SPRAY | RESPIRATORY_TRACT | 1 refills | Status: DC | PRN

## 2021-11-01 NOTE — ED Provider Notes (Signed)
Adam [name]   [id number] 11/01/21 Arrival Time: 7353  ASSESSMENT & PLAN:  1. Pain, dental   2. Mild intermittent asthma with acute exacerbation   3. Shortness of breath    No sign of abscess requiring I&D at this time. No resp distress.  Meds ordered this encounter  Medications   albuterol (VENTOLIN HFA) 108 (90 Base) MCG/ACT inhaler    Sig: Inhale 2 puffs into the lungs every 4 (four) hours as needed for wheezing or shortness of breath.    Dispense:  18 each    Refill:  1   clindamycin (CLEOCIN) 300 MG capsule    Sig: Take 1 capsule (300 mg total) by mouth 3 (three) times daily.    Dispense:  21 capsule    Refill:  0   HYDROcodone-acetaminophen (NORCO/VICODIN) 5-325 MG tablet    Sig: Take 1 tablet by mouth every 6 (six) hours as needed for moderate pain or severe pain.    Dispense:  8 tablet    Refill:  0    [place] Controlled Substances Registry consulted for this patient. I feel the risk/benefit ratio today is favorable for proceeding with this prescription for a controlled substance. Medication sedation precautions given.  Plans dental evaluation in near future.  Reviewed expectations re: course of current medical issues. Questions answered. Outlined signs and symptoms indicating need for more acute intervention. Patient verbalized understanding. After Visit Summary given.   SUBJECTIVE:  Adam [name] is a 56 y.o. male who reports gradual onset of right upper dental pain described as aching/throbbing. Present for 2-3 days. Fever: absent. Tolerating PO intake but reports pain with chewing. Normal swallowing. He does not see a dentist regularly. No neck swelling or pain. OTC analgesics without relief. Also with sporadic wheezing; past week or two; ques allergic trigger. No current SOB. Needs alb inhaler.  OBJECTIVE: Vitals:   11/01/21 0943 11/01/21 0945  BP: (!) 139/93   Pulse: 79   Resp: 18   Temp: 98.4 F (36.9 C)   TempSrc: Oral   SpO2: 93%    Weight:  68 kg  Height:  '5\' 6"'$  (1.676 m)    General appearance: alert; no distress HENT: normocephalic; atraumatic; dentition: fair; right upper gum without areas of fluctuance, drainage, or bleeding and with tenderness to palpation; normal jaw movement without difficulty Neck: supple without LAD; FROM; trachea midline Lungs: normal respirations; unlabored; speaks full sentences without difficulty; mild wheezing bilat Skin: warm and dry Psychological: alert and cooperative; normal mood and affect  Allergies  Allergen Reactions   Hctz [Hydrochlorothiazide] Other (See Comments)    Per wife, pt developed joint pain and swelling (esp shoulders and elbows)     Penicillins Other (See Comments)    Pt has family history of allergy but does not remember ever taking this    Past Medical History:  Diagnosis Date   Anxiety state, unspecified    BACK PAIN, CHRONIC 06/09/2010   Qualifier: Diagnosis of  By: Ernestina Patches MD, Steven     Bilateral inguinal hernia 02/26/2011   Dental abscess 02/15/2011   Esophageal reflux    no meds. per pt.   Family history of stress    Generalized headaches    Hiatal hernia    pt. denies nerve/mucscle disease   Hypertension    IBS (irritable bowel syndrome)    Major depressive disorder, single episode, unspecified    Onychomycosis 01/18/2014   Palpitations    Panic disorder without agoraphobia    Prostatitis, unspecified  RBBB (right bundle branch block)    Sleep apnea    Unspecified cardiovascular disease    Visual disturbance    Social History   Socioeconomic History   Marital status: Married    Spouse name: Not on file   Number of children: 9   Years of education: Not on file   Highest education level: Not on file  Occupational History   Occupation: Horticulturist, commercial    Employer: [employer]  Tobacco Use   Smoking status: Never    Passive exposure: Past   Smokeless tobacco: Never  Vaping Use   Vaping Use: Never used  Substance and Sexual Activity    Alcohol use: No   Drug use: No   Sexual activity: Yes    Partners: Female  Other Topics Concern   Not on file  Social History Narrative   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  He has nine children, 2 from first marriage, and 7 from [address] Lives with wife 459 and 3 sons 189 180 and 2102years old. Denies smoking, EtOH, recreational drugs, + remote h/o marijuana use when he was in his early 89s Owns a car, does not exercise regularly. Fishing, boating, and camping for fun. Stressors are financial but can afford food and housing.   Social Determinants of Health   Financial Resource Strain: Not on file  Food Insecurity: Not on file  Transportation Needs: Not on file  Physical Activity: Not on file  Stress: Not on file  Social Connections: Not on file  Intimate Partner Violence: Not on file   Family History  Problem Relation Age of Onset   Heart attack Mother 436      PTCA  and 3 stents   Heart disease Mother    COPD Mother    Anxiety disorder Mother    High blood pressure Mother    Kidney disease Mother    Drug abuse Brother        362passed away from xanax   Colon cancer Brother        passed away at 552  Hodgkin's lymphoma Maternal Grandmother        65   Heart disease Maternal Grandfather    Pyloric stenosis Daughter 252      X2   Bipolar disorder Daughter    Cancer Maternal Aunt        pt unaware of what kind   Cancer Maternal Uncle        pt unaware of what kind   Anxiety disorder Other    Esophageal cancer Neg Hx    Rectal cancer Neg Hx    Stomach cancer Neg Hx    Past Surgical History:  Procedure Laterality Date   COLONOSCOPY  07/26/2005   normal   COLONOSCOPY  05/26/2019   6 small adenomas noted on this colonoscopy, repeat in 3 years for surveillance   ESOPHAGOGASTRODUODENOSCOPY  01/03/2005   normal   FRACTURE SURGERY  2000   left arm   HERNIA REPAIR  03/14/2011   BIH   INGUINAL HERNIA REPAIR  03/14/2011   Procedure: LAPAROSCOPIC  BILATERAL INGUINAL HERNIA REPAIR;  Surgeon: DHarl Bowie MD;  Location: [facility];  Service: General;  Laterality: N/A;  with mesh   pyloric stenosis      testicle  56years old   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

## 2021-11-01 NOTE — ED Triage Notes (Signed)
Patient c/o dental pain in top right tooth x 3 days.  Patient has taken ASA.  No injury.  Has an appt w/dentist in July.

## 2021-11-01 NOTE — Discharge Instructions (Signed)

## 2022-02-21 NOTE — Progress Notes (Signed)
  SUBJECTIVE:   CHIEF COMPLAINT / HPI:   Lump in stomach: he was seen for this in the past. Worried because he has brother who passed from colon cancer. He states the lump feels like it is growing and he feels full. Feels like food gets stuck in his abdomen. He had a surgery for pyloric stenosis when he was a baby. He feels full even when he wakes up in the morning. Denies vomiting. Used to have GERD but this improved when he stopped drinking caffeine and eating spicy foods. Doesn't feel like reflux. Denies smoking history. Drinks minimally. Colonoscopy last performed in 2021, to be repeated in 2024.   PERTINENT  PMH / PSH: HTN, IBS-C, Sciatica, surgery for pyloric stenosis, hernia repairs  OBJECTIVE:  BP (!) 128/93   Pulse (!) 56   Ht '5\' 6"'$  (0.093 m)   Wt 159 lb 9.6 oz (72.4 kg)   SpO2 96%   BMI 25.76 kg/m  Physical Exam Constitutional:      Appearance: Normal appearance. He is not ill-appearing.  HENT:     Mouth/Throat:     Mouth: Mucous membranes are moist.     Pharynx: Oropharynx is clear. No oropharyngeal exudate or posterior oropharyngeal erythema.  Cardiovascular:     Rate and Rhythm: Normal rate and regular rhythm.     Heart sounds: Normal heart sounds.  Pulmonary:     Effort: Pulmonary effort is normal. No respiratory distress.     Breath sounds: Normal breath sounds.  Abdominal:     General: Abdomen is flat. Bowel sounds are normal.     Palpations: Abdomen is soft.     Tenderness: There is no abdominal tenderness.     Comments: Less than 1 cm soft, rubbery nodule on the right upper abdomen below the xiphoid process more prominent with bearing down c/w lipoma  Lymphadenopathy:     Cervical: No cervical adenopathy.  Neurological:     Mental Status: He is alert.  Psychiatric:        Mood and Affect: Mood normal.        Behavior: Behavior normal.    ASSESSMENT/PLAN:  Lipoma of abdominal wall Assessment & Plan: Consistent with lipoma on physical exam, chart review  notes bedside ultrasound previously and CT abdomen in 2021 did not reveal any upper abdominal processes.  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 May benefit from referral to surgery for consideration of removal.  Further recommendation of over-the-counter Pepcid.  He does not have significant risk factors for upper GI/esophageal cancer given lack of significant reflux in addition to no smoking and minimal drinking history.  Discussed return precautions such as emesis, changes in characteristics of stool such as blood. His colonoscopy should be repeated in 2024 given prior polyp removals.  Orders: -     Ambulatory referral to General Surgery  Shortness of breath -     Albuterol Sulfate HFA; Inhale 2 puffs into the lungs every 4 (four) hours as needed for wheezing or shortness of breath.  Dispense: 18 each; Refill: 1  Return if symptoms worsen or fail to improve. Wells Guiles, DO 02/22/2022, 10:04 AM PGY-2, [place]

## 2022-02-22 ENCOUNTER — Ambulatory Visit: Payer: Medicaid Other | Admitting: Student

## 2022-02-22 VITALS — BP 128/93 | HR 56 | Ht 66.0 in | Wt 159.6 lb

## 2022-02-22 DIAGNOSIS — D171 Benign lipomatous neoplasm of skin and subcutaneous tissue of trunk: Secondary | ICD-10-CM | POA: Diagnosis present

## 2022-02-22 DIAGNOSIS — R0602 Shortness of breath: Secondary | ICD-10-CM

## 2022-02-22 MED ORDER — ALBUTEROL SULFATE HFA 108 (90 BASE) MCG/ACT IN AERS: 2.0000 | INHALATION_SPRAY | RESPIRATORY_TRACT | 1 refills | Status: DC | PRN

## 2022-02-22 NOTE — Patient Instructions (Signed)
It was great to see you today! Thank you for choosing [facility] for your primary care. Zachary George was seen for lipoma.  Today we addressed: I have referred you to general surgery.  They may consider doing further imaging but it feels the same size as previously charted (less than 1 cm).  Should you start having regular vomiting or blood in your stool, this would prompt urgent reevaluation.  I recommend trialing an acid blocker such as Pepcid for couple weeks to see if that impacts your symptoms.  You will be due for another colonoscopy in 2024.  If you haven't already, sign up for My Chart to have easy access to your labs results, and communication with your primary care physician.  Call the clinic at [phone] if your symptoms worsen or you have any concerns.  You should return to our clinic Return if symptoms worsen or fail to improve. Please arrive 15 minutes before your appointment to ensure smooth check in process.  We appreciate your efforts in making this happen.  Thank you for allowing me to participate in your care, Wells Guiles, DO 02/22/2022, 9:01 AM PGY-2, [place]

## 2022-02-22 NOTE — Assessment & Plan Note (Signed)
Consistent with lipoma on physical exam, chart review notes bedside ultrasound previously and CT abdomen in 2021 did not reveal any upper abdominal processes.  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 May benefit from referral to surgery for consideration of removal.  Further recommendation of over-the-counter Pepcid.  He does not have significant risk factors for upper GI/esophageal cancer given lack of significant reflux in addition to no smoking and minimal drinking history.  Discussed return precautions such as emesis, changes in characteristics of stool such as blood. His colonoscopy should be repeated in 2024 given prior polyp removals.

## 2022-03-06 ENCOUNTER — Encounter (HOSPITAL_COMMUNITY): Payer: Self-pay

## 2022-03-06 ENCOUNTER — Ambulatory Visit (HOSPITAL_COMMUNITY)
Admission: EM | Admit: 2022-03-06 | Discharge: 2022-03-06 | Disposition: A | Discharge status: Home / Self Care | Payer: Medicaid Other | Attending: Emergency Medicine | Admitting: Emergency Medicine

## 2022-03-06 DIAGNOSIS — H60501 Unspecified acute noninfective otitis externa, right ear: Secondary | ICD-10-CM

## 2022-03-06 MED ORDER — CIPROFLOXACIN-DEXAMETHASONE 0.3-0.1 % OT SUSP: 4.0000 [drp] | Freq: Two times a day (BID) | OTIC | 0 refills | Status: DC

## 2022-03-06 NOTE — ED Triage Notes (Signed)
Pt c/o rt ear pain radiating to jaw x4 days. States putting peroxide in ear and taking ASA.

## 2022-03-06 NOTE — Discharge Instructions (Addendum)
Ciprodex eardrops have been sent to the pharmacy, please put 4 drops in right ear 2 times daily.  As discussed, you can use ibuprofen as needed for pain management, you may use this every 6 hours as needed, use ensure that you do not take more than 2400 mg in a 24-hour period.  If symptoms do not improve within 5 days please return for follow-up.

## 2022-03-06 NOTE — ED Provider Notes (Signed)
[facility]    CSN: [id number] Arrival date & time: 03/06/22  2353      History   Chief Complaint Chief Complaint  Patient presents with   Otalgia    HPI TANVEER BRAMMER is a 56 y.o. male.  Patient complaining of right ear pain x4 days.  Patient endorses yellow ear drainage that started 1 week ago that has now resolved.  Patient reports pain on the outside of his ear.  Patient denies any recent swimming or trauma to his ear.  Patient denies any history of past ear infection.  Patient reports muffled hearing sounds.  Patient denies any other cold symptoms. Patient has taken aspirin and hydrogen peroxide with some relief of symptoms.    Otalgia Associated symptoms: ear discharge (1 week prior to ear pain)   Associated symptoms: no congestion, no cough, no fever, no hearing loss, no rhinorrhea, no sore throat and no tinnitus     Past Medical History:  Diagnosis Date   Anxiety state, unspecified    BACK PAIN, CHRONIC 06/09/2010   Qualifier: Diagnosis of  By: Ernestina Patches MD, Steven     Bilateral inguinal hernia 02/26/2011   Dental abscess 02/15/2011   Esophageal reflux    no meds. per pt.   Family history of stress    Generalized headaches    Hiatal hernia    pt. 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   IBS (irritable bowel syndrome)- Constipation Predominant 06/19/2011   Light-headedness  05/14/2011   Shortness of breath 01/30/2011   Fatigue 12/13/2010   Abdominal pain 10/20/2010   Orthopnea 07/31/2010   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   Allergic rhinitis, seasonal 08/25/2009   HYPOKALEMIA, HX OF 12/30/2008   Essential hypertension, benign 08/04/2007   LOW BACK PAIN, CHRONIC 08/04/2007   Palpitations 04/25/2007   Chest tightness 04/18/2007   Anxiety state 07/04/2006   Panic Disorder 07/04/2006    Past Surgical History:  Procedure Laterality Date   COLONOSCOPY  07/26/2005   normal   COLONOSCOPY  05/26/2019   6 small adenomas noted on this colonoscopy, repeat in 3 years for surveillance   ESOPHAGOGASTRODUODENOSCOPY  01/03/2005   normal   FRACTURE SURGERY  2000   left arm   HERNIA REPAIR  03/14/2011   BIH   INGUINAL HERNIA REPAIR  03/14/2011   Procedure: LAPAROSCOPIC BILATERAL INGUINAL HERNIA REPAIR;  Surgeon: Harl Bowie, MD;  Location: [facility];  Service: General;  Laterality: N/A;  with mesh   pyloric stenosis      testicle  56 years old   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 Authorizing Provider  ciprofloxacin-dexamethasone (CIPRODEX) OTIC suspension Place 4 drops into the right ear 2 (two) times daily. 03/06/22  Yes Juventino Slovak  N, NP  albuterol (VENTOLIN HFA) 108 (90 Base) MCG/ACT inhaler Inhale 2 puffs into the lungs every 4 (four) hours as needed for wheezing or shortness of breath. 02/22/22   Wells Guiles, DO  ALPRAZolam Duanne Moron) 1 MG tablet Take 0.5 mg by mouth at bedtime.    [provider]  atorvastatin (LIPITOR) 40 MG tablet Take 1 tablet (40 mg total) by mouth daily. 02/28/21   Gerrit Heck, MD  fluticasone (FLOVENT HFA) 110 MCG/ACT inhaler Inhale 1 puff into the lungs daily. 02/24/21   Gerrit Heck, MD  metoprolol succinate (TOPROL-XL) 25 MG 24 hr tablet TAKE 1 TABLET (25 MG TOTAL) BY MOUTH DAILY.  10/03/21   Gerrit Heck, MD  dicyclomine (BENTYL) 10 MG capsule Take 2 capsules (20 mg total) by mouth 4 (four) times daily -  before meals and at bedtime. Take 20-30 minutes before meals Patient not taking: Reported on 05/26/2019 05/20/19 06/26/20  Levin Erp, PA    Family History Family History  Problem Relation Age of Onset   Heart attack Mother 4       PTCA  and 3 stents   Heart disease Mother    COPD Mother    Anxiety disorder Mother    High blood pressure Mother    Kidney disease Mother    Drug abuse Brother        44 passed away from xanax   Colon cancer Brother        passed away at 24   Hodgkin's lymphoma Maternal Grandmother        62   Heart disease Maternal Grandfather    Pyloric stenosis Daughter 12       X2   Bipolar disorder Daughter    Cancer Maternal Aunt        pt unaware of what kind   Cancer Maternal Uncle        pt unaware of what kind   Anxiety disorder Other    Esophageal cancer Neg Hx    Rectal cancer Neg Hx    Stomach cancer Neg Hx     Social History Social History   Tobacco Use   Smoking status: Never    Passive exposure: Past   Smokeless tobacco: Never  Vaping Use   Vaping Use: Never used  Substance Use Topics   Alcohol use: No   Drug use: No     Allergies   Hctz [hydrochlorothiazide] and Penicillins   Review of Systems Review of Systems  Constitutional:  Negative for chills and fever.  HENT:  Positive for ear discharge (1 week prior to ear pain) and ear pain. Negative for congestion, dental problem, facial swelling, hearing loss, postnasal drip, rhinorrhea, sinus pressure, sinus pain, sore throat, tinnitus and trouble swallowing.   Eyes: Negative.   Respiratory:  Negative for cough.      Physical Exam Triage Vital Signs ED Triage Vitals [03/06/22 0827]  Enc Vitals Group     BP (!) 141/89     Pulse Rate 71     Resp 18     Temp 98.8 F (37.1 C)     Temp Source Oral     SpO2 96 %     Weight      Height       Head Circumference      Peak Flow      Pain Score 8     Pain Loc      Pain Edu?      Excl. in [place]?  No data found.  Updated Vital Signs BP (!) 141/89 (BP Location: Left Arm)   Pulse 71   Temp 98.8 F (37.1 C) (Oral)   Resp 18   SpO2 96%   Physical Exam Vitals and nursing note reviewed.  HENT:     Right Ear: Tympanic membrane normal. Decreased hearing noted. Swelling and tenderness present. No drainage. No middle ear effusion. There is mastoid tenderness. Tympanic membrane is not injected, perforated, erythematous, retracted or bulging.     Left Ear: Hearing, tympanic membrane, ear canal and external ear normal.     Ears:     Comments: Tragal tenderness of the right ear.  Swelling noted to tragal area of right ear.     Nose: No congestion or rhinorrhea.     Right Sinus: No maxillary sinus tenderness or frontal sinus tenderness.     Left Sinus: No maxillary sinus tenderness or frontal sinus tenderness.  Lymphadenopathy:     Head:     Right side of head: No tonsillar adenopathy.     Left side of head: No tonsillar adenopathy.     Cervical: No cervical adenopathy.      UC Treatments / Results  Labs (all labs ordered are listed, but only abnormal results are displayed) Labs Reviewed - No data to display  EKG   Radiology No results found.  Procedures Procedures (including critical care time)  Medications Ordered in UC Medications - No data to display  Initial Impression / Assessment and Plan / UC Course  I have reviewed the triage vital signs and the nursing notes.  Pertinent labs & imaging results that were available during my care of the patient were reviewed by me and considered in my medical decision making (see chart for details).     Patient was evaluated for acute otitis externa of the right ear.  Ciprodex eardrops was sent to the pharmacy and patient was educated on how to use eardrops.  Patient was educated on using NSAIDs for pain management.   Patient was made aware on timeframe for improvement of symptoms and when he should follow-up if necessary.  Patient verbalized understanding of instructions.  Final Clinical Impressions(s) / UC Diagnoses   Final diagnoses:  Acute otitis externa of right ear, unspecified type     Discharge Instructions      Ciprodex eardrops have been sent to the pharmacy, please put 4 drops in right ear 2 times daily.  As discussed, you can use ibuprofen as needed for pain management, you may use this every 6 hours as needed, use ensure that you do not take more than 2400 mg in a 24-hour period.  If symptoms do not improve within 5 days please return for follow-up.      ED Prescriptions     Medication Sig Dispense Auth. Provider   ciprofloxacin-dexamethasone (CIPRODEX) OTIC suspension Place 4 drops into the right ear 2 (two) times daily. 7.5 mL Flossie Dibble, NP      PDMP not reviewed this encounter.   Flossie Dibble, NP 03/06/22 [phone]

## 2022-04-14 ENCOUNTER — Other Ambulatory Visit: Payer: Self-pay | Admitting: Student

## 2022-04-14 DIAGNOSIS — E7800 Pure hypercholesterolemia, unspecified: Secondary | ICD-10-CM

## 2022-04-20 ENCOUNTER — Other Ambulatory Visit: Payer: Self-pay | Admitting: Student

## 2022-04-20 DIAGNOSIS — R002 Palpitations: Secondary | ICD-10-CM

## 2022-05-21 ENCOUNTER — Encounter: Payer: Self-pay | Admitting: Gastroenterology

## 2022-06-13 ENCOUNTER — Ambulatory Visit: Payer: Medicaid Other | Admitting: Family Medicine

## 2022-06-21 ENCOUNTER — Ambulatory Visit: Payer: Medicaid Other | Admitting: Family Medicine

## 2022-07-17 ENCOUNTER — Ambulatory Visit: Payer: Medicaid Other | Admitting: Family Medicine

## 2022-07-17 ENCOUNTER — Encounter: Payer: Self-pay | Admitting: Family Medicine

## 2022-07-17 VITALS — BP 134/82 | HR 71 | Temp 98.3°F | Ht 66.0 in | Wt 157.0 lb

## 2022-07-17 DIAGNOSIS — Z125 Encounter for screening for malignant neoplasm of prostate: Secondary | ICD-10-CM

## 2022-07-17 DIAGNOSIS — K581 Irritable bowel syndrome with constipation: Secondary | ICD-10-CM

## 2022-07-17 DIAGNOSIS — Z0001 Encounter for general adult medical examination with abnormal findings: Secondary | ICD-10-CM

## 2022-07-17 DIAGNOSIS — E7800 Pure hypercholesterolemia, unspecified: Secondary | ICD-10-CM | POA: Diagnosis not present

## 2022-07-17 DIAGNOSIS — Z0000 Encounter for general adult medical examination without abnormal findings: Secondary | ICD-10-CM

## 2022-07-17 DIAGNOSIS — Z8639 Personal history of other endocrine, nutritional and metabolic disease: Secondary | ICD-10-CM

## 2022-07-17 DIAGNOSIS — I10 Essential (primary) hypertension: Secondary | ICD-10-CM

## 2022-07-17 DIAGNOSIS — Z1159 Encounter for screening for other viral diseases: Secondary | ICD-10-CM

## 2022-07-17 DIAGNOSIS — Z1211 Encounter for screening for malignant neoplasm of colon: Secondary | ICD-10-CM

## 2022-07-17 DIAGNOSIS — R739 Hyperglycemia, unspecified: Secondary | ICD-10-CM

## 2022-07-17 DIAGNOSIS — J45909 Unspecified asthma, uncomplicated: Secondary | ICD-10-CM

## 2022-07-17 NOTE — Progress Notes (Signed)
New Patient Office Visit  Subjective    Patient ID: Adam [name], male    DOB: Oct 16, 1965  Age: 57 y.o. MRN: [id number]  CC:  Chief Complaint  Patient presents with   Establish Care    HPI Adam [name] presents to establish care. Oriented to practice routines and expectations. Has not been seeing a PCP in some time. PMH include IBS with constipation, asthma, right sided sciatica, anxiety, hernia repairs, pyloric stenosis as young infant, and HTN. Concerns today include getting up to date on his cancer screenings.  Colonoscopy ordered PSA drawn today Vaccines declined   Outpatient Encounter Medications as of 07/17/2022  Medication Sig   albuterol (VENTOLIN HFA) 108 (90 Base) MCG/ACT inhaler Inhale 2 puffs into the lungs every 4 (four) hours as needed for wheezing or shortness of breath.   ALPRAZolam (XANAX) 1 MG tablet Take 0.5 mg by mouth at bedtime.   atorvastatin (LIPITOR) 40 MG tablet TAKE 1 TABLET BY MOUTH EVERY DAY   ciprofloxacin-dexamethasone (CIPRODEX) OTIC suspension Place 4 drops into the right ear 2 (two) times daily.   fluticasone (FLOVENT HFA) 110 MCG/ACT inhaler Inhale 1 puff into the lungs daily.   metoprolol succinate (TOPROL-XL) 25 MG 24 hr tablet TAKE 1 TABLET (25 MG TOTAL) BY MOUTH DAILY.   [DISCONTINUED] dicyclomine (BENTYL) 10 MG capsule Take 2 capsules (20 mg total) by mouth 4 (four) times daily -  before meals and at bedtime. Take 20-30 minutes before meals (Patient not taking: Reported on 05/26/2019)   No facility-administered encounter medications on file as of 07/17/2022.    Past Medical History:  Diagnosis Date   Anxiety state, unspecified    BACK PAIN, CHRONIC 06/09/2010   Qualifier: Diagnosis of  By: Ernestina Patches MD, Steven     Bilateral inguinal hernia 02/26/2011   Dental abscess 02/15/2011   Esophageal reflux    no meds. per pt.   Family history of stress    Generalized headaches    Hiatal hernia    pt. denies nerve/mucscle disease    Hypertension    IBS (irritable bowel syndrome)    Major depressive disorder, single episode, unspecified    Onychomycosis 01/18/2014   Palpitations    Panic disorder without agoraphobia    Prostatitis, unspecified    RBBB (right bundle branch block)    Unspecified cardiovascular disease    Visual disturbance     Past Surgical History:  Procedure Laterality Date   COLONOSCOPY  07/26/2005   normal   COLONOSCOPY  05/26/2019   6 small adenomas noted on this colonoscopy, repeat in 3 years for surveillance   ESOPHAGOGASTRODUODENOSCOPY  01/03/2005   normal   FRACTURE SURGERY  2000   left arm   HERNIA REPAIR  03/14/2011   BIH   INGUINAL HERNIA REPAIR  03/14/2011   Procedure: LAPAROSCOPIC BILATERAL INGUINAL HERNIA REPAIR;  Surgeon: Harl Bowie, MD;  Location: [facility];  Service: General;  Laterality: N/A;  with mesh   pyloric stenosis      testicle  57 years old   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    Family History  Problem Relation Age of Onset   Heart attack Mother 73       PTCA  and 3 stents   Heart disease Mother    COPD Mother    Anxiety disorder Mother    High blood pressure Mother    Kidney disease Mother    Drug abuse Brother  71 passed away from xanax   Colon cancer Brother        passed away at 67   Hodgkin's lymphoma Maternal Grandmother        65   Heart disease Maternal Grandfather    Pyloric stenosis Daughter 61       X2   Bipolar disorder Daughter    Cancer Maternal Aunt        pt unaware of what kind   Cancer Maternal Uncle        pt unaware of what kind   Anxiety disorder Other    Esophageal cancer Neg Hx    Rectal cancer Neg Hx    Stomach cancer Neg Hx     Social History   Socioeconomic History   Marital status: Married    Spouse name: Not on file   Number of children: 9   Years of education: Not on file   Highest education level: Not on file  Occupational History   Occupation: Horticulturist, commercial    Employer: [employer]   Tobacco Use   Smoking status: Never    Passive exposure: Past   Smokeless tobacco: Never  Vaping Use   Vaping Use: Never used  Substance and Sexual Activity   Alcohol use: No   Drug use: No   Sexual activity: Yes    Partners: Female  Other Topics Concern   Not on file  Social History Narrative   Says current working in [employer]. Brick mason x 59yr.  He has nine children, 2 from first marriage, and 7 from [address]rd Lives with wife 421 and 3 sons 148 168 and 272years old. Denies smoking, EtOH, recreational drugs, + remote h/o marijuana use when he was in his early 89s Owns a car, does not exercise regularly. Fishing, boating, and camping for fun. Stressors are financial but can afford food and housing.   Social Determinants of Health   Financial Resource Strain: Not on file  Food Insecurity: Not on file  Transportation Needs: Not on file  Physical Activity: Not on file  Stress: Not on file  Social Connections: Not on file  Intimate Partner Violence: Not on file    Review of Systems  Constitutional: Negative.   HENT: Negative.    Eyes: Negative.   Respiratory:  Positive for shortness of breath.   Cardiovascular: Negative.   Gastrointestinal: Negative.   Genitourinary: Negative.   Musculoskeletal: Negative.   Skin: Negative.   Neurological: Negative.   Endo/Heme/Allergies:  Positive for environmental allergies.  Psychiatric/Behavioral: Negative.    All other systems reviewed and are negative.       Objective    BP 134/82   Pulse 71   Temp 98.3 F (36.8 C) (Oral)   Ht '5\' 6"'$  (1.676 m)   Wt 157 lb (71.2 kg)   SpO2 97%   BMI 25.34 kg/m   Physical Exam Vitals and nursing note reviewed.  Constitutional:      Appearance: Normal appearance. He is normal weight.  HENT:     Head: Normocephalic and atraumatic.     Right Ear: Tympanic membrane, ear canal and external ear normal.     Left Ear: Tympanic membrane, ear canal and external ear normal.     Nose: Nose  normal.     Mouth/Throat:     Mouth: Mucous membranes are moist.     Pharynx: Oropharynx is clear.  Eyes:     Extraocular Movements: Extraocular movements intact.     Right eye: Normal  extraocular motion and no nystagmus.     Left eye: Normal extraocular motion and no nystagmus.     Conjunctiva/sclera: Conjunctivae normal.     Pupils: Pupils are equal, round, and reactive to light.  Cardiovascular:     Rate and Rhythm: Normal rate and regular rhythm.     Pulses: Normal pulses.     Heart sounds: Normal heart sounds.  Pulmonary:     Effort: Pulmonary effort is normal.     Breath sounds: Normal breath sounds.  Abdominal:     General: Bowel sounds are normal.     Palpations: Abdomen is soft.  Genitourinary:    Comments: Deferred using shared decision making Musculoskeletal:        General: Normal range of motion.     Cervical back: Normal range of motion and neck supple.  Skin:    General: Skin is warm and dry.     Capillary Refill: Capillary refill takes less than 2 seconds.  Neurological:     General: No focal deficit present.     Mental Status: He is alert. Mental status is at baseline.  Psychiatric:        Mood and Affect: Mood normal.        Speech: Speech normal.        Behavior: Behavior normal.        Thought Content: Thought content normal.        Cognition and Memory: Cognition and memory normal.        Judgment: Judgment normal.         Assessment & Plan:   Problem List Items Addressed This Visit       Cardiovascular and Mediastinum   Essential hypertension, benign    Well controlled on Metoprolol, has not taken his medications this AM as he was fasting for labs. 134/82 today and report it is less than 130/80 at home. Denies symptoms. Continue Metoprolol '25mg'$  daily. On Atorvastatin '40mg'$  daily.      Relevant Orders   CBC with Differential/Platelet   COMPLETE METABOLIC PANEL WITH GFR   Lipid panel     Digestive   IBS (irritable bowel syndrome)-  Constipation Predominant    Diet controlled. Does complain of some abdominal bloating. Colonioscopy in 2021 with benign polyps and sigmoid diverticulosis, due for repeat. He has a grape-sized abdominal mass on exam consistent with lipoma, has been evaluated by GI last year with similar finding, no concerning findings on prior CT, was referred to general surgery for further evaluation, will follow up on status of this.        Other   Hypercholesterolemia   Relevant Orders   COMPLETE METABOLIC PANEL WITH GFR   Lipid panel   Physical exam, annual - Primary    Today your medical history was reviewed and routine physical exam with labs was performed. Recommend 150 minutes of moderate intensity exercise weekly and consuming a well-balanced diet. Advised to stop smoking if a smoker, avoid smoking if a non-smoker, limit alcohol consumption to 1 drink per day for women and 2 drinks per day for men, and avoid illicit drug use. Counseled on safe sex practices and offered STI testing today. Counseled on the importance of sunscreen use. Counseled in mental health awareness and when to seek medical care. Vaccine maintenance discussed. Appropriate health maintenance items reviewed. Return to office in 1 year for annual physical exam.       Other Visit Diagnoses     Colon cancer screening  Relevant Orders   Ambulatory referral to Gastroenterology   Prostate cancer screening       Relevant Orders   PSA   Asthma, unspecified asthma severity, unspecified whether complicated, unspecified whether persistent       Relevant Orders   Ambulatory referral to Allergy   Need for hepatitis C screening test       Relevant Orders   Hepatitis C antibody   History of thyroid nodule       Relevant Orders   TSH       Return in about 6 months (around 01/17/2023).   Rubie Maid, FNP

## 2022-07-17 NOTE — Patient Instructions (Signed)
It was great to meet you today and I'm excited to have you join the [facility] practice. I hope you had a positive experience today! If you feel so inclined, please feel free to recommend our practice to friends and family. Jefferie Holston, FNP-C  

## 2022-07-17 NOTE — Assessment & Plan Note (Signed)
Diet controlled. Does complain of some abdominal bloating. Colonioscopy in 2021 with benign polyps and sigmoid diverticulosis, due for repeat. He has a grape-sized abdominal mass on exam consistent with lipoma, has been evaluated by GI last year with similar finding, no concerning findings on prior CT, was referred to general surgery for further evaluation, will follow up on status of this.

## 2022-07-17 NOTE — Assessment & Plan Note (Signed)

## 2022-07-17 NOTE — Assessment & Plan Note (Addendum)
Well controlled on Metoprolol, has not taken his medications this AM as he was fasting for labs. 134/82 today and report it is less than 130/80 at home. Denies symptoms. Continue Metoprolol '25mg'$  daily. On Atorvastatin '40mg'$  daily.

## 2022-07-18 LAB — CBC WITH DIFFERENTIAL/PLATELET
Absolute Monocytes: 690 cells/uL (ref 200–950)
Basophils Absolute: 87 cells/uL (ref 0–200)
Basophils Relative: 1.3 %
Eosinophils Absolute: 489 cells/uL (ref 15–500)
Eosinophils Relative: 7.3 %
HCT: 48.9 % (ref 38.5–50.0)
Hemoglobin: 16.2 g/dL (ref 13.2–17.1)
Lymphs Abs: 1702 cells/uL (ref 850–3900)
MCH: 28 pg (ref 27.0–33.0)
MCHC: 33.1 g/dL (ref 32.0–36.0)
MCV: 84.5 fL (ref 80.0–100.0)
MPV: 11.3 fL (ref 7.5–12.5)
Monocytes Relative: 10.3 %
Neutro Abs: 3732 cells/uL (ref 1500–7800)
Neutrophils Relative %: 55.7 %
Platelets: 208 10*3/uL (ref 140–400)
RBC: 5.79 10*6/uL (ref 4.20–5.80)
RDW: 12.5 % (ref 11.0–15.0)
Total Lymphocyte: 25.4 %
WBC: 6.7 10*3/uL (ref 3.8–10.8)

## 2022-07-18 LAB — HEPATITIS C ANTIBODY: Hepatitis C Ab: NONREACTIVE

## 2022-07-18 LAB — COMPLETE METABOLIC PANEL WITH GFR
AG Ratio: 1.8 (calc) (ref 1.0–2.5)
ALT: 26 U/L (ref 9–46)
AST: 22 U/L (ref 10–35)
Albumin: 4.5 g/dL (ref 3.6–5.1)
Alkaline phosphatase (APISO): 115 U/L (ref 35–144)
BUN: 19 mg/dL (ref 7–25)
CO2: 23 mmol/L (ref 20–32)
Calcium: 9.7 mg/dL (ref 8.6–10.3)
Chloride: 105 mmol/L (ref 98–110)
Creat: 1.2 mg/dL (ref 0.70–1.30)
Globulin: 2.5 g/dL (calc) (ref 1.9–3.7)
Glucose, Bld: 109 mg/dL — ABNORMAL HIGH (ref 65–99)
Potassium: 4.7 mmol/L (ref 3.5–5.3)
Sodium: 143 mmol/L (ref 135–146)
Total Bilirubin: 0.5 mg/dL (ref 0.2–1.2)
Total Protein: 7 g/dL (ref 6.1–8.1)
eGFR: 71 mL/min/{1.73_m2} (ref >=60)

## 2022-07-18 LAB — HEMOGLOBIN A1C
Hgb A1c MFr Bld: 6.2 % of total Hgb — ABNORMAL HIGH (ref <5.7)
Mean Plasma Glucose: 131 mg/dL
eAG (mmol/L): 7.3 mmol/L

## 2022-07-18 LAB — TEST AUTHORIZATION

## 2022-07-18 LAB — LIPID PANEL
Cholesterol: 128 mg/dL (ref <200)
HDL: 60 mg/dL (ref >=40)
LDL Cholesterol (Calc): 53 mg/dL (calc)
Non-HDL Cholesterol (Calc): 68 mg/dL (calc) (ref <130)
Total CHOL/HDL Ratio: 2.1 (calc) (ref <5.0)
Triglycerides: 73 mg/dL (ref <150)

## 2022-07-18 LAB — TSH: TSH: 1.47 mIU/L (ref 0.40–4.50)

## 2022-07-18 LAB — PSA: PSA: 0.8 ng/mL (ref <=4.00)

## 2022-07-18 NOTE — Addendum Note (Signed)
Addended by: Rubie Maid on: 07/18/2022 07:52 AM   Modules accepted: Orders

## 2022-07-24 ENCOUNTER — Ambulatory Visit: Payer: Medicaid Other | Admitting: Family Medicine

## 2022-08-22 ENCOUNTER — Other Ambulatory Visit: Payer: Self-pay

## 2022-08-22 DIAGNOSIS — R062 Wheezing: Secondary | ICD-10-CM

## 2022-08-22 MED ORDER — FLUTICASONE PROPIONATE HFA 110 MCG/ACT IN AERO: 1.0000 | INHALATION_SPRAY | Freq: Every day | RESPIRATORY_TRACT | 12 refills | Status: DC

## 2022-10-06 ENCOUNTER — Encounter (HOSPITAL_COMMUNITY): Payer: Self-pay | Admitting: Emergency Medicine

## 2022-10-06 ENCOUNTER — Ambulatory Visit (INDEPENDENT_AMBULATORY_CARE_PROVIDER_SITE_OTHER): Payer: Medicaid Other

## 2022-10-06 ENCOUNTER — Other Ambulatory Visit: Payer: Self-pay

## 2022-10-06 ENCOUNTER — Ambulatory Visit (HOSPITAL_COMMUNITY)
Admission: EM | Admit: 2022-10-06 | Discharge: 2022-10-06 | Disposition: A | Discharge status: Home / Self Care | Payer: Medicaid Other | Attending: Physician Assistant | Admitting: Physician Assistant

## 2022-10-06 DIAGNOSIS — J4541 Moderate persistent asthma with (acute) exacerbation: Secondary | ICD-10-CM

## 2022-10-06 DIAGNOSIS — R0602 Shortness of breath: Secondary | ICD-10-CM

## 2022-10-06 LAB — POCT FASTING CBG KUC MANUAL ENTRY: POCT Glucose (KUC): 104 mg/dL — AB (ref 70–99)

## 2022-10-06 MED ORDER — ALBUTEROL SULFATE HFA 108 (90 BASE) MCG/ACT IN AERS: 2.0000 | INHALATION_SPRAY | RESPIRATORY_TRACT | 1 refills | Status: DC | PRN

## 2022-10-06 MED ORDER — PREDNISONE 20 MG PO TABS: 40.0000 mg | ORAL_TABLET | Freq: Every day | ORAL | 0 refills | Status: AC

## 2022-10-06 MED ORDER — BUDESONIDE-FORMOTEROL FUMARATE 80-4.5 MCG/ACT IN AERO: 2.0000 | INHALATION_SPRAY | Freq: Two times a day (BID) | RESPIRATORY_TRACT | 2 refills | Status: DC

## 2022-10-06 MED ORDER — IPRATROPIUM-ALBUTEROL 0.5-2.5 (3) MG/3ML IN SOLN
3.0000 mL | Freq: Once | RESPIRATORY_TRACT | Status: AC
  Administered 2022-10-06: 3 mL via RESPIRATORY_TRACT

## 2022-10-06 MED ORDER — IPRATROPIUM-ALBUTEROL 0.5-2.5 (3) MG/3ML IN SOLN
RESPIRATORY_TRACT | Status: AC
  Filled 2022-10-06: qty 3

## 2022-10-06 NOTE — ED Triage Notes (Addendum)
Complains of asthma, hard to breath, congestion in chest.  Reports this is ongoing issue, worse the last couple of weeks.    Hard to breathe when lying down.  Speaking in complete sentences, NAD  Has used albuterol inhaler.

## 2022-10-06 NOTE — ED Provider Notes (Signed)
[facility]    CSN: [id number] Arrival date & time: 10/06/22  1003      History   Chief Complaint Chief Complaint  Patient presents with   Asthma    HPI Adam [name] is a 57 y.o. male.   Patient presents today with a several day history of worsening asthma symptoms.  Reports that he has been using his albuterol more frequently since the weather became warmer a few months ago but over the past week this has worsened.  He is having to use his albuterol inhaler frequently and is waking up at night with coughing and shortness of breath.  Denies previous hospitalization related to asthma.  He does report some ongoing congestion but attributes this to seasonal allergies.  He has not been taking any over-the-counter medication for symptom management.  Denies any known sick contacts.  Denies any associated fever, chest pain, nausea, vomiting, weakness.  Denies any recent steroids.  He is prediabetic with his last A1c 6.2% on 07/17/2022.  He is not currently on a maintenance medication for asthma as he ran out of of Flovent a while ago.    Past Medical History:  Diagnosis Date   Anxiety state, unspecified    BACK PAIN, CHRONIC 06/09/2010   Qualifier: Diagnosis of  By: Alvester Morin MD, Steven     Bilateral inguinal hernia 02/26/2011   Dental abscess 02/15/2011   Esophageal reflux    no meds. per pt.   Family history of stress    Generalized headaches    Hiatal hernia    pt. denies nerve/mucscle disease   Hypertension    IBS (irritable bowel syndrome)    Major depressive disorder, single episode, unspecified    Onychomycosis 01/18/2014   Palpitations    Panic disorder without agoraphobia    Prostatitis, unspecified    RBBB (right bundle branch block)    Unspecified cardiovascular disease    Visual disturbance     Patient Active Problem List   Diagnosis Date Noted   Physical exam, annual 07/17/2022   Sciatica of right side 06/15/2021   Hypercholesterolemia 02/28/2021    Colon polyps 05/26/2019   Lipoma of abdominal wall 03/17/2014   IBS (irritable bowel syndrome)- Constipation Predominant 06/19/2011   Orthopnea 07/31/2010   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   Allergic rhinitis, seasonal 08/25/2009   Essential hypertension, benign 08/04/2007   LOW BACK PAIN, CHRONIC 08/04/2007   Panic Disorder 07/04/2006    Past Surgical History:  Procedure Laterality Date   COLONOSCOPY  07/26/2005   normal   COLONOSCOPY  05/26/2019   6 small adenomas noted on this colonoscopy, repeat in 3 years for surveillance   ESOPHAGOGASTRODUODENOSCOPY  01/03/2005   normal   FRACTURE SURGERY  2000   left arm   HERNIA REPAIR  03/14/2011   BIH   INGUINAL HERNIA REPAIR  03/14/2011   Procedure: LAPAROSCOPIC BILATERAL INGUINAL HERNIA REPAIR;  Surgeon: Shelly Rubenstein, MD;  Location: [facility];  Service: General;  Laterality: N/A;  with mesh   pyloric stenosis      testicle  57 years old   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 Authorizing Provider  budesonide-formoterol (SYMBICORT) 80-4.5 MCG/ACT inhaler Inhale 2 puffs into the lungs in the morning and at bedtime. 10/06/22  Yes Diavion Labrador K, PA-C  predniSONE (DELTASONE) 20 MG tablet Take 2 tablets (40  mg total) by mouth daily for 4 days. 10/06/22 10/10/22 Yes Clayborne Divis K, PA-C  albuterol (VENTOLIN HFA) 108 (90 Base) MCG/ACT inhaler Inhale 2 puffs into the lungs every 4 (four) hours as needed for wheezing or shortness of breath. 10/06/22   Willet Schleifer, Noberto Retort, PA-C  ALPRAZolam (XANAX) 1 MG tablet Take 0.5 mg by mouth at bedtime.    [provider]  atorvastatin (LIPITOR) 40 MG tablet TAKE 1 TABLET BY MOUTH EVERY DAY 04/16/22   Levin Erp, MD  metoprolol succinate (TOPROL-XL) 25 MG 24 hr tablet TAKE 1 TABLET (25 MG TOTAL) BY MOUTH DAILY. 04/20/22   Levin Erp, MD  dicyclomine (BENTYL) 10 MG capsule Take  2 capsules (20 mg total) by mouth 4 (four) times daily -  before meals and at bedtime. Take 20-30 minutes before meals Patient not taking: Reported on 05/26/2019 05/20/19 06/26/20  Unk Lightning, PA    Family History Family History  Problem Relation Age of Onset   Heart attack Mother 36       PTCA  and 3 stents   Heart disease Mother    COPD Mother    Anxiety disorder Mother    High blood pressure Mother    Kidney disease Mother    Drug abuse Brother        77 passed away from xanax   Colon cancer Brother        passed away at 44   Hodgkin's lymphoma Maternal Grandmother        74   Heart disease Maternal Grandfather    Pyloric stenosis Daughter 65       X2   Bipolar disorder Daughter    Cancer Maternal Aunt        pt unaware of what kind   Cancer Maternal Uncle        pt unaware of what kind   Anxiety disorder Other    Esophageal cancer Neg Hx    Rectal cancer Neg Hx    Stomach cancer Neg Hx     Social History Social History   Tobacco Use   Smoking status: Never    Passive exposure: Past   Smokeless tobacco: Never  Vaping Use   Vaping Use: Never used  Substance Use Topics   Alcohol use: No   Drug use: No     Allergies   Penicillins   Review of Systems Review of Systems  Constitutional:  Positive for activity change. Negative for appetite change, fatigue and fever.  HENT:  Positive for congestion. Negative for sinus pressure, sneezing and sore throat.   Respiratory:  Positive for cough, shortness of breath and wheezing. Negative for chest tightness.   Cardiovascular:  Negative for chest pain.  Gastrointestinal:  Negative for abdominal pain, diarrhea, nausea and vomiting.  Neurological:  Negative for dizziness, light-headedness and headaches.     Physical Exam Triage Vital Signs ED Triage Vitals  Enc Vitals Group     BP 10/06/22 1033 (!) 141/97     Pulse Rate 10/06/22 1033 72     Resp 10/06/22 1033 20     Temp 10/06/22 1033 98.3 F (36.8 C)      Temp Source 10/06/22 1033 Oral     SpO2 10/06/22 1033 94 %     Weight --      Height --      Head Circumference --      Peak Flow --      Pain Score 10/06/22 1030 0  Pain Loc --      Pain Edu? --      Excl. in GC? --    No data found.  Updated Vital Signs BP (!) 145/81 (BP Location: Left Arm) Comment: repositioned  Pulse 67   Temp 98.3 F (36.8 C) (Oral)   Resp 20   SpO2 99%   Visual Acuity Right Eye Distance:   Left Eye Distance:   Bilateral Distance:    Right Eye Near:   Left Eye Near:    Bilateral Near:     Physical Exam Vitals reviewed.  Constitutional:      General: He is awake.     Appearance: Normal appearance. He is well-developed. He is not ill-appearing.     Comments: Very pleasant male appears stated age in no acute distress sitting comfortably in exam room  HENT:     Head: Normocephalic and atraumatic.     Right Ear: Tympanic membrane, ear canal and external ear normal. Tympanic membrane is not erythematous or bulging.     Left Ear: Tympanic membrane, ear canal and external ear normal. Tympanic membrane is not erythematous or bulging.     Nose: Nose normal.     Mouth/Throat:     Pharynx: Uvula midline. No oropharyngeal exudate or posterior oropharyngeal erythema.  Cardiovascular:     Rate and Rhythm: Normal rate and regular rhythm.     Heart sounds: Normal heart sounds, S1 normal and S2 normal. No murmur heard. Pulmonary:     Effort: Pulmonary effort is normal. No accessory muscle usage or respiratory distress.     Breath sounds: No stridor. Wheezing present. No rhonchi or rales.     Comments: Widespread wheezing Abdominal:     General: Bowel sounds are normal.     Palpations: Abdomen is soft.     Tenderness: There is no abdominal tenderness.  Neurological:     Mental Status: He is alert.  Psychiatric:        Behavior: Behavior is cooperative.      UC Treatments / Results  Labs (all labs ordered are listed, but only abnormal results  are displayed) Labs Reviewed  POCT FASTING CBG [facility] MANUAL ENTRY - Abnormal; Notable for the following components:      Result Value   POCT Glucose ([facility]) 104 (*)    All other components within normal limits    EKG   Radiology DG Chest 2 View  Result Date: 10/06/2022 CLINICAL DATA:  Wheezing and shortness of breath. Difficulty breathing when lying down. EXAM: CHEST - 2 VIEW COMPARISON:  Two-view chest x-ray 06/26/2020 FINDINGS: The heart size and mediastinal contours are within normal limits. Both lungs are clear. The visualized skeletal structures are unremarkable. IMPRESSION: Negative two view chest x-ray Electronically Signed   By: Marin Roberts M.D.   On: 10/06/2022 11:05    Procedures Procedures (including critical care time)  Medications Ordered in UC Medications  ipratropium-albuterol (DUONEB) 0.5-2.5 (3) MG/3ML nebulizer solution 3 mL (3 mLs Nebulization Given 10/06/22 1110)    Initial Impression / Assessment and Plan / UC Course  I have reviewed the triage vital signs and the nursing notes.  Pertinent labs & imaging results that were available during my care of the patient were reviewed by me and considered in my medical decision making (see chart for details).     Patient is well-appearing, afebrile, nontoxic, nontachycardic.  Viral testing was deferred as he has been symptomatic for a prolonged period of time this would not change management.  No evidence of acute infection on physical exam that warrant initiation of antibiotics.  X-ray was obtained that showed no acute cardiopulmonary disease.  He was given a DuoNeb in clinic with significant improvement of symptoms.  Refill of albuterol was provided with instruction to use this as needed every 4-6 hours for acute symptoms.  Will restart maintenance medication and Symbicort sent to the pharmacy with instruction to rinse mouth following use of this medication to prevent thrush.  Will start prednisone burst of 40 mg for 4  days.  He was instructed not to take NSAIDs with this medication to risk of GI bleeding.  Encouraged him to take a daily allergy medication.  He is to follow-up with his PCP for reevaluation.  Discussed that if anything worsens he needs to be seen immediately.  Strict return precautions given.  Patient declined work excuse note.  Final Clinical Impressions(s) / UC Diagnoses   Final diagnoses:  Moderate persistent asthma with exacerbation     Discharge Instructions      I am glad that you are feeling better after your medication.  I have called in a refill of your albuterol.  Use this every 4-6 hours as needed for shortness of breath.  Start Symbicort twice daily.  Rinse your mouth following use of this medication to prevent thrush.  Take prednisone 40 mg for 4 days.  Do not take NSAIDs with this medication including aspirin, ibuprofen/Advil, naproxen/Aleve.  I recommend a daily over-the-counter antihistamine for allergies.  If your symptoms or not improving within a few days or if you have any worsening symptoms including persistent wheezing, shortness of breath, severe cough, chest discomfort you need to be seen immediately.     ED Prescriptions     Medication Sig Dispense Auth. Provider   albuterol (VENTOLIN HFA) 108 (90 Base) MCG/ACT inhaler Inhale 2 puffs into the lungs every 4 (four) hours as needed for wheezing or shortness of breath. 18 each Benisha Hadaway K, PA-C   predniSONE (DELTASONE) 20 MG tablet Take 2 tablets (40 mg total) by mouth daily for 4 days. 8 tablet Kwanza Cancelliere K, PA-C   budesonide-formoterol (SYMBICORT) 80-4.5 MCG/ACT inhaler Inhale 2 puffs into the lungs in the morning and at bedtime. 1 each Tavian Callander, Noberto Retort, PA-C      PDMP not reviewed this encounter.   Jeani Hawking, PA-C 10/06/22 1128

## 2022-10-06 NOTE — Discharge Instructions (Signed)
I am glad that you are feeling better after your medication.  I have called in a refill of your albuterol.  Use this every 4-6 hours as needed for shortness of breath.  Start Symbicort twice daily.  Rinse your mouth following use of this medication to prevent thrush.  Take prednisone 40 mg for 4 days.  Do not take NSAIDs with this medication including aspirin, ibuprofen/Advil, naproxen/Aleve.  I recommend a daily over-the-counter antihistamine for allergies.  If your symptoms or not improving within a few days or if you have any worsening symptoms including persistent wheezing, shortness of breath, severe cough, chest discomfort you need to be seen immediately.

## 2022-10-20 ENCOUNTER — Other Ambulatory Visit: Payer: Self-pay | Admitting: Student

## 2022-10-20 DIAGNOSIS — R002 Palpitations: Secondary | ICD-10-CM

## 2022-11-26 ENCOUNTER — Encounter (HOSPITAL_COMMUNITY): Payer: Self-pay

## 2022-11-26 ENCOUNTER — Ambulatory Visit (HOSPITAL_COMMUNITY)
Admission: EM | Admit: 2022-11-26 | Discharge: 2022-11-26 | Disposition: A | Discharge status: Home / Self Care | Payer: Medicaid Other | Attending: Family Medicine | Admitting: Family Medicine

## 2022-11-26 ENCOUNTER — Ambulatory Visit (INDEPENDENT_AMBULATORY_CARE_PROVIDER_SITE_OTHER): Payer: Medicaid Other

## 2022-11-26 DIAGNOSIS — M5441 Lumbago with sciatica, right side: Secondary | ICD-10-CM | POA: Diagnosis not present

## 2022-11-26 DIAGNOSIS — M5442 Lumbago with sciatica, left side: Secondary | ICD-10-CM

## 2022-11-26 MED ORDER — KETOROLAC TROMETHAMINE 30 MG/ML IJ SOLN
30.0000 mg | Freq: Once | INTRAMUSCULAR | Status: AC
  Administered 2022-11-26: 30 mg via INTRAMUSCULAR

## 2022-11-26 MED ORDER — TIZANIDINE HCL 4 MG PO TABS: 4.0000 mg | ORAL_TABLET | Freq: Three times a day (TID) | ORAL | 0 refills | Status: DC | PRN

## 2022-11-26 MED ORDER — MELOXICAM 15 MG PO TABS: 15.0000 mg | ORAL_TABLET | Freq: Every day | ORAL | 0 refills | Status: DC

## 2022-11-26 MED ORDER — KETOROLAC TROMETHAMINE 30 MG/ML IJ SOLN
INTRAMUSCULAR | Status: AC
  Filled 2022-11-26: qty 1

## 2022-11-26 NOTE — ED Provider Notes (Signed)
[facility]    CSN: [id number] Arrival date & time: 11/26/22  4098      History   Chief Complaint Chief Complaint  Patient presents with   Back Pain    HPI Adam [name] is a 57 y.o. male.    Back Pain Here for low back pain.  It initially began bothering him about 2 weeks ago when he was in the yard shoveling dirt.  He states he twisted 1 way with a load of dirt and he had sudden pain in his low back.  It did improve over the next 2 to 3 days with his taking Advil.  It never completely went away and then in the last few days has begun to bother him again.  No new trauma or fall.  No fever or chills and no rash.  No upper respiratory symptoms. No dysuria or hematuria.    No bowel or bladder incontinence or other alarm symptoms.  He does state that he has had back problems before Past Medical History:  Diagnosis Date   Anxiety state, unspecified    BACK PAIN, CHRONIC 06/09/2010   Qualifier: Diagnosis of  By: Alvester Morin MD, Steven     Bilateral inguinal hernia 02/26/2011   Dental abscess 02/15/2011   Esophageal reflux    no meds. per pt.   Family history of stress    Generalized headaches    Hiatal hernia    pt. denies nerve/mucscle disease   Hypertension    IBS (irritable bowel syndrome)    Major depressive disorder, single episode, unspecified    Onychomycosis 01/18/2014   Palpitations    Panic disorder without agoraphobia    Prostatitis, unspecified    RBBB (right bundle branch block)    Unspecified cardiovascular disease    Visual disturbance     Patient Active Problem List   Diagnosis Date Noted   Physical exam, annual 07/17/2022   Sciatica of right side 06/15/2021   Hypercholesterolemia 02/28/2021   Colon polyps 05/26/2019   Lipoma of abdominal wall 03/17/2014   IBS (irritable bowel syndrome)- Constipation Predominant 06/19/2011   Orthopnea 07/31/2010   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   Allergic rhinitis, seasonal 08/25/2009   Essential  hypertension, benign 08/04/2007   LOW BACK PAIN, CHRONIC 08/04/2007   Panic Disorder 07/04/2006    Past Surgical History:  Procedure Laterality Date   COLONOSCOPY  07/26/2005   normal   COLONOSCOPY  05/26/2019   6 small adenomas noted on this colonoscopy, repeat in 3 years for surveillance   ESOPHAGOGASTRODUODENOSCOPY  01/03/2005   normal   FRACTURE SURGERY  2000   left arm   HERNIA REPAIR  03/14/2011   BIH   INGUINAL HERNIA REPAIR  03/14/2011   Procedure: LAPAROSCOPIC BILATERAL INGUINAL HERNIA REPAIR;  Surgeon: Shelly Rubenstein, MD;  Location: [facility];  Service: General;  Laterality: N/A;  with mesh   pyloric stenosis      testicle  57 years old   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 Authorizing Provider  albuterol (VENTOLIN HFA) 108 (90 Base) MCG/ACT inhaler Inhale 2 puffs into the lungs every 4 (four) hours as needed for wheezing or shortness of breath. 10/06/22  Yes Raspet, Denny Peon K, PA-C  ALPRAZolam (XANAX) 1 MG tablet Take 0.5 mg by mouth at bedtime.   Yes [provider]  atorvastatin (LIPITOR) 40  MG tablet TAKE 1 TABLET BY MOUTH EVERY DAY 04/16/22  Yes Levin Erp, MD  budesonide-formoterol (SYMBICORT) 80-4.5 MCG/ACT inhaler Inhale 2 puffs into the lungs in the morning and at bedtime. 10/06/22  Yes Raspet, Noberto Retort, PA-C  meloxicam (MOBIC) 15 MG tablet Take 1 tablet (15 mg total) by mouth daily. 11/26/22  Yes Zenia Resides, MD  metoprolol succinate (TOPROL-XL) 25 MG 24 hr tablet TAKE 1 TABLET (25 MG TOTAL) BY MOUTH DAILY. 10/22/22  Yes Howard, Amber S, FNP  tiZANidine (ZANAFLEX) 4 MG tablet Take 1 tablet (4 mg total) by mouth every 8 (eight) hours as needed for muscle spasms. 11/26/22  Yes Geoffrey Hynes, Janace Aris, MD  dicyclomine (BENTYL) 10 MG capsule Take 2 capsules (20 mg total) by mouth 4 (four) times daily -  before meals and at bedtime. Take 20-30  minutes before meals Patient not taking: Reported on 05/26/2019 05/20/19 06/26/20  Unk Lightning, PA    Family History Family History  Problem Relation Age of Onset   Heart attack Mother 15       PTCA  and 3 stents   Heart disease Mother    COPD Mother    Anxiety disorder Mother    High blood pressure Mother    Kidney disease Mother    Drug abuse Brother        6 passed away from xanax   Colon cancer Brother        passed away at 59   Hodgkin's lymphoma Maternal Grandmother        70   Heart disease Maternal Grandfather    Pyloric stenosis Daughter 21       X2   Bipolar disorder Daughter    Cancer Maternal Aunt        pt unaware of what kind   Cancer Maternal Uncle        pt unaware of what kind   Anxiety disorder Other    Esophageal cancer Neg Hx    Rectal cancer Neg Hx    Stomach cancer Neg Hx     Social History Social History   Tobacco Use   Smoking status: Never    Passive exposure: Past   Smokeless tobacco: Never  Vaping Use   Vaping status: Never Used  Substance Use Topics   Alcohol use: No   Drug use: No     Allergies   Penicillins   Review of Systems Review of Systems  Musculoskeletal:  Positive for back pain.     Physical Exam Triage Vital Signs ED Triage Vitals [11/26/22 1038]  Encounter Vitals Group     BP (!) 171/101     Systolic BP Percentile      Diastolic BP Percentile      Pulse Rate 82     Resp 18     Temp 98.6 F (37 C)     Temp Source Oral     SpO2 96 %     Weight      Height      Head Circumference      Peak Flow      Pain Score      Pain Loc      Pain Education      Exclude from Growth Chart    No data found.  Updated Vital Signs BP (!) 171/101 (BP Location: Left Arm)   Pulse 82   Temp 98.6 F (37 C) (Oral)   Resp 18   SpO2 96%   Visual  Acuity Right Eye Distance:   Left Eye Distance:   Bilateral Distance:    Right Eye Near:   Left Eye Near:    Bilateral Near:     Physical Exam Vitals  reviewed.  Constitutional:      General: He is not in acute distress.    Appearance: He is not ill-appearing, toxic-appearing or diaphoretic.  HENT:     Nose: Nose normal.     Mouth/Throat:     Mouth: Mucous membranes are moist.     Pharynx: No oropharyngeal exudate or posterior oropharyngeal erythema.  Eyes:     Extraocular Movements: Extraocular movements intact.     Pupils: Pupils are equal, round, and reactive to light.  Cardiovascular:     Rate and Rhythm: Normal rate and regular rhythm.     Heart sounds: No murmur heard. Pulmonary:     Effort: Pulmonary effort is normal.     Breath sounds: Normal breath sounds.  Musculoskeletal:     Comments: Some midline tenderness over the mid lumbar spine.  No erythema or rash or deformity.  Skin:    Coloration: Skin is not pale.  Neurological:     General: No focal deficit present.     Mental Status: He is alert and oriented to person, place, and time.  Psychiatric:        Behavior: Behavior normal.      UC Treatments / Results  Labs (all labs ordered are listed, but only abnormal results are displayed) Labs Reviewed - No data to display  EKG   Radiology No results found.  Procedures Procedures (including critical care time)  Medications Ordered in UC Medications  ketorolac (TORADOL) 30 MG/ML injection 30 mg (has no administration in time range)    Initial Impression / Assessment and Plan / UC Course  I have reviewed the triage vital signs and the nursing notes.  Pertinent labs & imaging results that were available during my care of the patient were reviewed by me and considered in my medical decision making (see chart for details).        Toradol injection is given here and meloxicam and tizanidine are sent in for pain relief and muscle spasm.  X-rays are done today and by my review shows some mild degenerative changes.  Radiology report is not available at the time of patient discharge.  We will notify him if  there is anything else on the report that he has abnormal. Final Clinical Impressions(s) / UC Diagnoses   Final diagnoses:  Acute midline low back pain with bilateral sciatica     Discharge Instructions      I see degenerative changes like arthritis on your x-rays of your back.  Meloxicam 15 mg--1 tablet daily as needed for pain   Take tizanidine 4 mg--1 every 8 hours as needed for muscle spasms; this medication can cause dizziness and sleepiness  You have been given a shot of Toradol 30 mg today.  Please follow-up with your primary care about this issue.  We will notify you if the radiologist sees anything abnormal on the x-rays.     ED Prescriptions     Medication Sig Dispense Auth. Provider   meloxicam (MOBIC) 15 MG tablet Take 1 tablet (15 mg total) by mouth daily. 30 tablet Dannell Gortney, Janace Aris, MD   tiZANidine (ZANAFLEX) 4 MG tablet Take 1 tablet (4 mg total) by mouth every 8 (eight) hours as needed for muscle spasms. 15 tablet Shalia Bartko, Janace Aris, MD  I have reviewed the PDMP during this encounter.   Zenia Resides, MD 11/26/22 1159

## 2022-11-26 NOTE — Discharge Instructions (Signed)
I see degenerative changes like arthritis on your x-rays of your back.  Meloxicam 15 mg--1 tablet daily as needed for pain   Take tizanidine 4 mg--1 every 8 hours as needed for muscle spasms; this medication can cause dizziness and sleepiness  You have been given a shot of Toradol 30 mg today.  Please follow-up with your primary care about this issue.  We will notify you if the radiologist sees anything abnormal on the x-rays.

## 2022-11-26 NOTE — ED Triage Notes (Signed)
Complains of centered back pain that started 2 weeks ago when he was working in the yard.

## 2023-01-07 ENCOUNTER — Ambulatory Visit
Admission: EM | Admit: 2023-01-07 | Discharge: 2023-01-07 | Disposition: A | Discharge status: Home / Self Care | Payer: Medicaid Other | Attending: Family Medicine | Admitting: Family Medicine

## 2023-01-07 DIAGNOSIS — J4541 Moderate persistent asthma with (acute) exacerbation: Secondary | ICD-10-CM

## 2023-01-07 DIAGNOSIS — U071 COVID-19: Secondary | ICD-10-CM | POA: Diagnosis not present

## 2023-01-07 DIAGNOSIS — R0602 Shortness of breath: Secondary | ICD-10-CM

## 2023-01-07 MED ORDER — BUDESONIDE-FORMOTEROL FUMARATE 80-4.5 MCG/ACT IN AERO
2.0000 | INHALATION_SPRAY | Freq: Two times a day (BID) | RESPIRATORY_TRACT | 0 refills | Status: DC
Start: 2023-01-07 — End: 2023-11-14

## 2023-01-07 MED ORDER — ALBUTEROL SULFATE (2.5 MG/3ML) 0.083% IN NEBU: 2.5000 mg | INHALATION_SOLUTION | RESPIRATORY_TRACT | 0 refills | Status: DC | PRN

## 2023-01-07 MED ORDER — PREDNISONE 20 MG PO TABS: 40.0000 mg | ORAL_TABLET | Freq: Every day | ORAL | 0 refills | Status: DC

## 2023-01-07 MED ORDER — ALBUTEROL SULFATE HFA 108 (90 BASE) MCG/ACT IN AERS: 2.0000 | INHALATION_SPRAY | RESPIRATORY_TRACT | 0 refills | Status: DC | PRN

## 2023-01-07 MED ORDER — PAXLOVID (300/100) 20 X 150 MG & 10 X 100MG PO TBPK: 3.0000 | ORAL_TABLET | Freq: Two times a day (BID) | ORAL | 0 refills | Status: AC

## 2023-01-07 NOTE — ED Provider Notes (Signed)
[facility]    CSN: [id number] Arrival date & time: 01/07/23  1730      History   Chief Complaint Chief Complaint  Patient presents with   Sore Throat   Covid Exposure    HPI Adam [name] is a 57 y.o. male.   Patient presenting today with several day history of sore throat, productive cough, headache, fatigue, chest tightness, shortness of breath, wheezing.  Denies fever, chills, body aches, abdominal pain, nausea vomiting or diarrhea.  History of asthma, out of albuterol inhaler and neb solution as well as his maintenance inhaler.  Home COVID test was positive today, wife also diagnosed with COVID today.  Not trying over-the-counter remedies thus far.    Past Medical History:  Diagnosis Date   Anxiety state, unspecified    BACK PAIN, CHRONIC 06/09/2010   Qualifier: Diagnosis of  By: Alvester Morin MD, Steven     Bilateral inguinal hernia 02/26/2011   Dental abscess 02/15/2011   Esophageal reflux    no meds. per pt.   Family history of stress    Generalized headaches    Hiatal hernia    pt. denies nerve/mucscle disease   Hypertension    IBS (irritable bowel syndrome)    Major depressive disorder, single episode, unspecified    Onychomycosis 01/18/2014   Palpitations    Panic disorder without agoraphobia    Prostatitis, unspecified    RBBB (right bundle branch block)    Unspecified cardiovascular disease    Visual disturbance     Patient Active Problem List   Diagnosis Date Noted   Physical exam, annual 07/17/2022   Sciatica of right side 06/15/2021   Hypercholesterolemia 02/28/2021   Colon polyps 05/26/2019   Lipoma of abdominal wall 03/17/2014   IBS (irritable bowel syndrome)- Constipation Predominant 06/19/2011   Orthopnea 07/31/2010   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   Allergic rhinitis, seasonal 08/25/2009   Essential hypertension, benign 08/04/2007   LOW BACK PAIN, CHRONIC 08/04/2007   Panic Disorder 07/04/2006    Past Surgical History:   Procedure Laterality Date   COLONOSCOPY  07/26/2005   normal   COLONOSCOPY  05/26/2019   6 small adenomas noted on this colonoscopy, repeat in 3 years for surveillance   ESOPHAGOGASTRODUODENOSCOPY  01/03/2005   normal   FRACTURE SURGERY  2000   left arm   HERNIA REPAIR  03/14/2011   BIH   INGUINAL HERNIA REPAIR  03/14/2011   Procedure: LAPAROSCOPIC BILATERAL INGUINAL HERNIA REPAIR;  Surgeon: Shelly Rubenstein, MD;  Location: [facility];  Service: General;  Laterality: N/A;  with mesh   pyloric stenosis      testicle  57 years old   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 Authorizing Provider  albuterol (PROVENTIL) (2.5 MG/3ML) 0.083% nebulizer solution Take 3 mLs (2.5 mg total) by nebulization every 4 (four) hours as needed for wheezing or shortness of breath. 01/07/23  Yes Particia Nearing, PA-C  ALPRAZolam Prudy Feeler) 1 MG tablet Take 0.5 mg by mouth at bedtime.   Yes [provider]  metoprolol succinate (TOPROL-XL) 25 MG 24 hr tablet TAKE 1 TABLET (25 MG TOTAL) BY MOUTH DAILY. 10/22/22  Yes Howard, Amber S, FNP  nirmatrelvir & ritonavir (PAXLOVID, 300/100,) 20 x 150 MG & 10 x 100MG  TBPK Take 3 tablets by mouth 2 (two) times daily for 5 days. 01/07/23 01/12/23  Yes Particia Nearing, PA-C  predniSONE (DELTASONE) 20 MG tablet Take 2 tablets (40 mg total) by mouth daily with breakfast. 01/07/23  Yes Particia Nearing, PA-C  albuterol (VENTOLIN HFA) 108 (90 Base) MCG/ACT inhaler Inhale 2 puffs into the lungs every 4 (four) hours as needed for wheezing or shortness of breath. 01/07/23   Particia Nearing, PA-C  atorvastatin (LIPITOR) 40 MG tablet TAKE 1 TABLET BY MOUTH EVERY DAY 04/16/22   Levin Erp, MD  budesonide-formoterol [facility]) 80-4.5 MCG/ACT inhaler Inhale 2 puffs into the lungs in the morning and at bedtime. 01/07/23   Particia Nearing,  PA-C  meloxicam (MOBIC) 15 MG tablet Take 1 tablet (15 mg total) by mouth daily. 11/26/22   Zenia Resides, MD  tiZANidine (ZANAFLEX) 4 MG tablet Take 1 tablet (4 mg total) by mouth every 8 (eight) hours as needed for muscle spasms. 11/26/22   Zenia Resides, MD  dicyclomine (BENTYL) 10 MG capsule Take 2 capsules (20 mg total) by mouth 4 (four) times daily -  before meals and at bedtime. Take 20-30 minutes before meals Patient not taking: Reported on 05/26/2019 05/20/19 06/26/20  Unk Lightning, PA    Family History Family History  Problem Relation Age of Onset   Heart attack Mother 66       PTCA  and 3 stents   Heart disease Mother    COPD Mother    Anxiety disorder Mother    High blood pressure Mother    Kidney disease Mother    Drug abuse Brother        51 passed away from xanax   Colon cancer Brother        passed away at 57   Hodgkin's lymphoma Maternal Grandmother        41   Heart disease Maternal Grandfather    Pyloric stenosis Daughter 13       X2   Bipolar disorder Daughter    Cancer Maternal Aunt        pt unaware of what kind   Cancer Maternal Uncle        pt unaware of what kind   Anxiety disorder Other    Esophageal cancer Neg Hx    Rectal cancer Neg Hx    Stomach cancer Neg Hx     Social History Social History   Tobacco Use   Smoking status: Never    Passive exposure: Past   Smokeless tobacco: Never  Vaping Use   Vaping status: Never Used  Substance Use Topics   Alcohol use: No   Drug use: No     Allergies   Penicillins   Review of Systems Review of Systems Per HPI  Physical Exam Triage Vital Signs ED Triage Vitals [01/07/23 1845]  Encounter Vitals Group     BP (!) 172/101     Systolic BP Percentile      Diastolic BP Percentile      Pulse Rate 68     Resp 18     Temp 98.9 F (37.2 C)     Temp Source Oral     SpO2 98 %     Weight      Height      Head Circumference      Peak Flow      Pain Score 0     Pain Loc       Pain Education      Exclude from Growth Chart    No data  found.  Updated Vital Signs BP (!) 172/101 (BP Location: Right Arm)   Pulse 68   Temp 98.9 F (37.2 C) (Oral)   Resp 18   SpO2 98%   Visual Acuity Right Eye Distance:   Left Eye Distance:   Bilateral Distance:    Right Eye Near:   Left Eye Near:    Bilateral Near:     Physical Exam Vitals and nursing note reviewed.  Constitutional:      Appearance: He is well-developed.  HENT:     Head: Atraumatic.     Right Ear: External ear normal.     Left Ear: External ear normal.     Nose: Rhinorrhea present.     Mouth/Throat:     Pharynx: Posterior oropharyngeal erythema present. No oropharyngeal exudate.  Eyes:     Conjunctiva/sclera: Conjunctivae normal.     Pupils: Pupils are equal, round, and reactive to light.  Cardiovascular:     Rate and Rhythm: Normal rate and regular rhythm.  Pulmonary:     Effort: Pulmonary effort is normal. No respiratory distress.     Breath sounds: Wheezing present. No rales.  Musculoskeletal:        General: Normal range of motion.     Cervical back: Normal range of motion and neck supple.  Lymphadenopathy:     Cervical: No cervical adenopathy.  Skin:    General: Skin is warm and dry.  Neurological:     Mental Status: He is alert and oriented to person, place, and time.  Psychiatric:        Behavior: Behavior normal.      UC Treatments / Results  Labs (all labs ordered are listed, but only abnormal results are displayed) Labs Reviewed - No data to display  EKG   Radiology No results found.  Procedures Procedures (including critical care time)  Medications Ordered in UC Medications - No data to display  Initial Impression / Assessment and Plan / UC Course  I have reviewed the triage vital signs and the nursing notes.  Pertinent labs & imaging results that were available during my care of the patient were reviewed by me and considered in my medical decision making  (see chart for details).     Positive COVID test at home and positive home exposure, will start Paxlovid in addition to treating asthma exacerbation with prednisone, refilling Symbicort, albuterol, albuterol neb solution.  Discussed supportive over-the-counter medications, home care.  He did confirm that he is no longer on his statin medication, discussed to stay off of it until finished with the Paxlovid as they cannot be taken together. Return for worsening symptoms. Final Clinical Impressions(s) / UC Diagnoses   Final diagnoses:  COVID-19  Moderate persistent asthma with acute exacerbation   Discharge Instructions   None    ED Prescriptions     Medication Sig Dispense Auth. Provider   predniSONE (DELTASONE) 20 MG tablet Take 2 tablets (40 mg total) by mouth daily with breakfast. 10 tablet Particia Nearing, PA-C   nirmatrelvir & ritonavir (PAXLOVID, 300/100,) 20 x 150 MG & 10 x 100MG  TBPK Take 3 tablets by mouth 2 (two) times daily for 5 days. 30 tablet Particia Nearing, [place]   albuterol (VENTOLIN HFA) 108 (90 Base) MCG/ACT inhaler Inhale 2 puffs into the lungs every 4 (four) hours as needed for wheezing or shortness of breath. 18 each Particia Nearing, PA-C   budesonide-formoterol [facility]) 80-4.5 MCG/ACT inhaler Inhale 2 puffs into the lungs in the  morning and at bedtime. 1 each Particia Nearing, PA-C   albuterol (PROVENTIL) (2.5 MG/3ML) 0.083% nebulizer solution Take 3 mLs (2.5 mg total) by nebulization every 4 (four) hours as needed for wheezing or shortness of breath. 75 mL Particia Nearing, [place]      PDMP not reviewed this encounter.   Particia Nearing, [place] 01/07/23 1947

## 2023-01-07 NOTE — ED Triage Notes (Signed)
Sore throat, chest tightness, SOB, headache, that started a week ago. Pt took a home covid test and was positive today.

## 2023-02-02 ENCOUNTER — Other Ambulatory Visit: Payer: Self-pay | Admitting: Family Medicine

## 2023-02-02 DIAGNOSIS — R0602 Shortness of breath: Secondary | ICD-10-CM

## 2023-02-04 NOTE — Telephone Encounter (Signed)
Unable to refill per protocol, last refill by another provider not at this practice.  Requested Prescriptions  Pending Prescriptions Disp Refills   VENTOLIN HFA 108 (90 Base) MCG/ACT inhaler [Pharmacy Med Name: VENTOLIN HFA 90 MCG INHALER] 18 each 0    Sig: INHALE 2 PUFFS INTO THE LUNGS EVERY 4 HOURS AS NEEDED FOR WHEEZING OR SHORTNESS OF BREATH.     There is no refill protocol information for this order

## 2023-04-09 ENCOUNTER — Encounter: Payer: Self-pay | Admitting: Family Medicine

## 2023-04-09 ENCOUNTER — Ambulatory Visit: Payer: Medicaid Other | Admitting: Family Medicine

## 2023-04-09 VITALS — BP 124/82 | HR 62 | Temp 97.6°F | Ht 66.0 in | Wt 160.5 lb

## 2023-04-09 DIAGNOSIS — R7303 Prediabetes: Secondary | ICD-10-CM

## 2023-04-09 DIAGNOSIS — R631 Polydipsia: Secondary | ICD-10-CM | POA: Insufficient documentation

## 2023-04-09 DIAGNOSIS — K219 Gastro-esophageal reflux disease without esophagitis: Secondary | ICD-10-CM

## 2023-04-09 MED ORDER — OMEPRAZOLE 20 MG PO CPDR: 20.0000 mg | DELAYED_RELEASE_CAPSULE | Freq: Every day | ORAL | 3 refills | Status: DC

## 2023-04-09 NOTE — Patient Instructions (Signed)
Elevated HOB if needed and avoid lying down 2-3 hours after eating, avoid coffee, alcohol, chocolate, fatty foods, citrus, carbonated beverages, spicy foods, late meals, and smoking. Return to office if symptoms return or worsen and seek medical care for difficulty swallowing, bleeding, anemia, weight loss, recurrent vomiting

## 2023-04-09 NOTE — Assessment & Plan Note (Signed)
Chronic uncontrolled. Missed GI referral last year. Start omeprazole 20mg  daily. The pathophysiology of reflux is discussed.  Anti-reflux measures such as raising the head of the bed, avoiding tight clothing or belts, avoiding eating late at night and not lying down shortly after mealtime and achieving weight loss are discussed. Avoid ASA, NSAID's, caffeine, peppermints, alcohol and tobacco. Alert me if there are persistent symptoms, dysphagia, weight loss or GI bleeding. Follow up if symptoms worse or do not improve in 1 week.

## 2023-04-09 NOTE — Progress Notes (Signed)
Subjective:  HPI: Adam [name] is a 57 y.o. male presenting on 04/09/2023 for Abdominal Pain (LLQ pain has been going on " a long time") and Dysphagia (Pt states he feels like he has trouble swallowing and it gets stuck in his lower esophagus. )   HPI Patient is in today for LLQ pain with BMs and would like routine checkup with cancer screenings. Additional symptoms include epigastric pain and vomiting with feeling of fullness after meals. He reports feeling bloated constantly. This has been occurring for years. He is overdue for a colonoscopy since January, last colonoscopy in 2021 showed sigmoid diverticula and benign polyps. Denies hematemesis, hematochezia, melena, fever, chills, body aches, weight loss, anorexia, or dysphagia. He does avoid spicy foods and nicotine, drinks occasional ETOH. BMs are every few days and vary from soft formed to hard formed.  Review of Systems  All other systems reviewed and are negative.   Relevant past medical history reviewed and updated as indicated.   Past Medical History:  Diagnosis Date   Anxiety state, unspecified    BACK PAIN, CHRONIC 06/09/2010   Qualifier: Diagnosis of  By: Alvester Morin MD, Steven     Bilateral inguinal hernia 02/26/2011   Dental abscess 02/15/2011   Esophageal reflux    no meds. per pt.   Family history of stress    Generalized headaches    Hiatal hernia    pt. denies nerve/mucscle disease   Hypertension    IBS (irritable bowel syndrome)    Major depressive disorder, single episode, unspecified    Onychomycosis 01/18/2014   Palpitations    Panic disorder without agoraphobia    Prostatitis, unspecified    RBBB (right bundle branch block)    Unspecified cardiovascular disease    Visual disturbance      Past Surgical History:  Procedure Laterality Date   COLONOSCOPY  07/26/2005   normal   COLONOSCOPY  05/26/2019   6 small adenomas noted on this colonoscopy, repeat in 3 years for surveillance    ESOPHAGOGASTRODUODENOSCOPY  01/03/2005   normal   FRACTURE SURGERY  2000   left arm   HERNIA REPAIR  03/14/2011   BIH   INGUINAL HERNIA REPAIR  03/14/2011   Procedure: LAPAROSCOPIC BILATERAL INGUINAL HERNIA REPAIR;  Surgeon: Shelly Rubenstein, MD;  Location: [facility];  Service: General;  Laterality: N/A;  with mesh   pyloric stenosis      testicle  57 years old   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   Current Outpatient Medications:    albuterol (PROVENTIL) (2.5 MG/3ML) 0.083% nebulizer solution, Take 3 mLs (2.5 mg total) by nebulization every 4 (four) hours as needed for wheezing or shortness of breath., Disp: 75 mL, Rfl: 0   albuterol (VENTOLIN HFA) 108 (90 Base) MCG/ACT inhaler, Inhale 2 puffs into the lungs every 4 (four) hours as needed for wheezing or shortness of breath., Disp: 18 each, Rfl: 0   ALPRAZolam (XANAX) 1 MG tablet, Take 0.5 mg by mouth at bedtime., Disp: , Rfl:    metoprolol succinate (TOPROL-XL) 25 MG 24 hr tablet, TAKE 1 TABLET (25 MG TOTAL) BY MOUTH DAILY., Disp: 90 tablet, Rfl: 1   omeprazole (PRILOSEC) 20 MG capsule, Take 1 capsule (20 mg total) by mouth daily., Disp: 30 capsule, Rfl: 3   atorvastatin (LIPITOR) 40 MG tablet, TAKE 1 TABLET BY MOUTH EVERY DAY (Patient not taking: Reported on 04/09/2023), Disp: 90 tablet, Rfl: 3  budesonide-formoterol (SYMBICORT) 80-4.5 MCG/ACT inhaler, Inhale 2 puffs into the lungs in the morning and at bedtime. (Patient not taking: Reported on 04/09/2023), Disp: 1 each, Rfl: 0   meloxicam (MOBIC) 15 MG tablet, Take 1 tablet (15 mg total) by mouth daily. (Patient not taking: Reported on 04/09/2023), Disp: 30 tablet, Rfl: 0   predniSONE (DELTASONE) 20 MG tablet, Take 2 tablets (40 mg total) by mouth daily with breakfast. (Patient not taking: Reported on 04/09/2023), Disp: 10 tablet, Rfl: 0   tiZANidine (ZANAFLEX) 4 MG tablet, Take 1 tablet (4 mg total) by mouth every 8 (eight)  hours as needed for muscle spasms. (Patient not taking: Reported on 04/09/2023), Disp: 15 tablet, Rfl: 0  Allergies  Allergen Reactions   Penicillins Other (See Comments)    Pt has family history of allergy but does not remember ever taking this    Objective:   BP 124/82 (BP Location: Left Arm)   Pulse 62   Temp 97.6 F (36.4 C)   Ht 5\' 6"  (1.676 m)   Wt 160 lb 8 oz (72.8 kg)   SpO2 98%   BMI 25.91 kg/m      04/09/2023    9:02 AM 01/07/2023    6:45 PM 11/26/2022   10:38 AM  Vitals with BMI  Height 5\' 6"     Weight 160 lbs 8 oz    BMI 25.92    Systolic 124 172 130  Diastolic 82 101 101  Pulse 62 68 82     Physical Exam Vitals and nursing note reviewed.  Constitutional:      Appearance: Normal appearance. He is normal weight.  HENT:     Head: Normocephalic and atraumatic.  Abdominal:     General: Bowel sounds are decreased. There is no distension.     Palpations: Abdomen is soft. There is mass (RUQ lipoma).     Tenderness: There is no abdominal tenderness. There is no guarding or rebound.     Hernia: No hernia is present.  Skin:    General: Skin is warm and dry.     Capillary Refill: Capillary refill takes less than 2 seconds.  Neurological:     General: No focal deficit present.     Mental Status: He is alert and oriented to person, place, and time. Mental status is at baseline.  Psychiatric:        Mood and Affect: Mood normal.        Behavior: Behavior normal.        Thought Content: Thought content normal.        Judgment: Judgment normal.     Assessment & Plan:  Gastroesophageal reflux disease without esophagitis Assessment & Plan: Chronic uncontrolled. Missed GI referral last year. Start omeprazole 20mg  daily. The pathophysiology of reflux is discussed.  Anti-reflux measures such as raising the head of the bed, avoiding tight clothing or belts, avoiding eating late at night and not lying down shortly after mealtime and achieving weight loss are discussed.  Avoid ASA, NSAID's, caffeine, peppermints, alcohol and tobacco. Alert me if there are persistent symptoms, dysphagia, weight loss or GI bleeding. Follow up if symptoms worse or do not improve in 1 week.   Orders: -     COMPLETE METABOLIC PANEL WITH GFR -     Ambulatory referral to Gastroenterology -     Omeprazole; Take 1 capsule (20 mg total) by mouth daily.  Dispense: 30 capsule; Refill: 3  Prediabetes -     Hemoglobin A1c  Follow up plan: Return in about 3 months (around 07/18/2023), or if symptoms worsen or fail to improve, for annual physical with labs 1 week prior.  Park Meo, FNP

## 2023-04-10 LAB — HEMOGLOBIN A1C
Hgb A1c MFr Bld: 6.2 %{Hb} — ABNORMAL HIGH (ref <5.7)
Mean Plasma Glucose: 131 mg/dL
eAG (mmol/L): 7.3 mmol/L

## 2023-04-10 LAB — COMPLETE METABOLIC PANEL WITH GFR
AG Ratio: 1.8 (calc) (ref 1.0–2.5)
ALT: 17 U/L (ref 9–46)
AST: 17 U/L (ref 10–35)
Albumin: 4.5 g/dL (ref 3.6–5.1)
Alkaline phosphatase (APISO): 90 U/L (ref 35–144)
BUN: 19 mg/dL (ref 7–25)
CO2: 29 mmol/L (ref 20–32)
Calcium: 10 mg/dL (ref 8.6–10.3)
Chloride: 103 mmol/L (ref 98–110)
Creat: 1.12 mg/dL (ref 0.70–1.30)
Globulin: 2.5 g/dL (ref 1.9–3.7)
Glucose, Bld: 109 mg/dL — ABNORMAL HIGH (ref 65–99)
Potassium: 4.9 mmol/L (ref 3.5–5.3)
Sodium: 139 mmol/L (ref 135–146)
Total Bilirubin: 0.6 mg/dL (ref 0.2–1.2)
Total Protein: 7 g/dL (ref 6.1–8.1)
eGFR: 76 mL/min/{1.73_m2} (ref >=60)

## 2023-04-12 ENCOUNTER — Other Ambulatory Visit: Payer: Self-pay

## 2023-04-12 DIAGNOSIS — R0602 Shortness of breath: Secondary | ICD-10-CM

## 2023-04-12 MED ORDER — ALBUTEROL SULFATE HFA 108 (90 BASE) MCG/ACT IN AERS: 2.0000 | INHALATION_SPRAY | RESPIRATORY_TRACT | 1 refills | Status: DC | PRN

## 2023-04-17 ENCOUNTER — Ambulatory Visit (HOSPITAL_COMMUNITY)
Admission: EM | Admit: 2023-04-17 | Discharge: 2023-04-17 | Disposition: A | Discharge status: Home / Self Care | Payer: Medicaid Other | Attending: Family Medicine | Admitting: Family Medicine

## 2023-04-17 ENCOUNTER — Ambulatory Visit (INDEPENDENT_AMBULATORY_CARE_PROVIDER_SITE_OTHER): Payer: Medicaid Other

## 2023-04-17 ENCOUNTER — Encounter (HOSPITAL_COMMUNITY): Payer: Self-pay | Admitting: *Deleted

## 2023-04-17 DIAGNOSIS — J45901 Unspecified asthma with (acute) exacerbation: Secondary | ICD-10-CM | POA: Diagnosis not present

## 2023-04-17 DIAGNOSIS — J22 Unspecified acute lower respiratory infection: Secondary | ICD-10-CM

## 2023-04-17 MED ORDER — PREDNISONE 20 MG PO TABS: 40.0000 mg | ORAL_TABLET | Freq: Every day | ORAL | 0 refills | Status: AC

## 2023-04-17 MED ORDER — AZITHROMYCIN 250 MG PO TABS: ORAL_TABLET | ORAL | 0 refills | Status: DC

## 2023-04-17 MED ORDER — IPRATROPIUM-ALBUTEROL 0.5-2.5 (3) MG/3ML IN SOLN
RESPIRATORY_TRACT | Status: AC
Start: 2023-04-17 — End: ?
  Filled 2023-04-17: qty 3

## 2023-04-17 MED ORDER — METHYLPREDNISOLONE SODIUM SUCC 125 MG IJ SOLR
INTRAMUSCULAR | Status: AC
  Filled 2023-04-17: qty 2

## 2023-04-17 MED ORDER — IPRATROPIUM-ALBUTEROL 0.5-2.5 (3) MG/3ML IN SOLN
3.0000 mL | Freq: Once | RESPIRATORY_TRACT | Status: AC
  Administered 2023-04-17: 3 mL via RESPIRATORY_TRACT

## 2023-04-17 MED ORDER — METHYLPREDNISOLONE SODIUM SUCC 125 MG IJ SOLR
125.0000 mg | Freq: Once | INTRAMUSCULAR | Status: AC
Start: 2023-04-17 — End: 2023-04-17
  Administered 2023-04-17: 125 mg via INTRAMUSCULAR

## 2023-04-17 MED ORDER — PROMETHAZINE-DM 6.25-15 MG/5ML PO SYRP: 5.0000 mL | ORAL_SOLUTION | Freq: Three times a day (TID) | ORAL | 0 refills | Status: DC | PRN

## 2023-04-17 NOTE — ED Triage Notes (Signed)
Pt states he has cough and congestion X 2 weeks. He has been using his inhaler. He did use his inhaler this morning.  He has a nebulizer at home and he did a treatment last night, unsure of meds in machine.

## 2023-04-17 NOTE — ED Provider Notes (Signed)
[facility]    CSN: [id number] Arrival date & time: 04/17/23  4098      History   Chief Complaint Chief Complaint  Patient presents with   Nasal Congestion   Cough    HPI Adam [name] is a 57 y.o. male.  Patient with a history of asthma, history of pneumonia presents today with a 2-week history of worsening cough, congestion shortness of breath and wheezing.  Patient reports illness began with a mild cough and has progressed over the last 2 weeks.  He denies fever, chills, nausea or vomiting.  He currently has no upper respiratory symptoms.  He reports increased use of nebulizer treatments at home and albuterol inhaler.  He endorses a productive cough of frothy like sputum.  He is unaware of any known sick contacts.   Past Medical History:  Diagnosis Date   Anxiety state, unspecified    BACK PAIN, CHRONIC 06/09/2010   Qualifier: Diagnosis of  By: Alvester Morin MD, Steven     Bilateral inguinal hernia 02/26/2011   Dental abscess 02/15/2011   Esophageal reflux    no meds. per pt.   Family history of stress    Generalized headaches    Hiatal hernia    pt. denies nerve/mucscle disease   Hypertension    IBS (irritable bowel syndrome)    Major depressive disorder, single episode, unspecified    Onychomycosis 01/18/2014   Palpitations    Panic disorder without agoraphobia    Prostatitis, unspecified    RBBB (right bundle branch block)    Unspecified cardiovascular disease    Visual disturbance     Patient Active Problem List   Diagnosis Date Noted   Gastroesophageal reflux disease without esophagitis 04/09/2023   Polydipsia 04/09/2023   Prediabetes 04/09/2023   Physical exam, annual 07/17/2022   Sciatica of right side 06/15/2021   Hypercholesterolemia 02/28/2021   Colon polyps 05/26/2019   Lipoma of abdominal wall 03/17/2014   IBS (irritable bowel syndrome)- Constipation Predominant 06/19/2011   Orthopnea 07/31/2010   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    Allergic rhinitis, seasonal 08/25/2009   Essential hypertension, benign 08/04/2007   LOW BACK PAIN, CHRONIC 08/04/2007   Panic Disorder 07/04/2006    Past Surgical History:  Procedure Laterality Date   COLONOSCOPY  07/26/2005   normal   COLONOSCOPY  05/26/2019   6 small adenomas noted on this colonoscopy, repeat in 3 years for surveillance   ESOPHAGOGASTRODUODENOSCOPY  01/03/2005   normal   FRACTURE SURGERY  2000   left arm   HERNIA REPAIR  03/14/2011   BIH   INGUINAL HERNIA REPAIR  03/14/2011   Procedure: LAPAROSCOPIC BILATERAL INGUINAL HERNIA REPAIR;  Surgeon: Shelly Rubenstein, MD;  Location: [facility];  Service: General;  Laterality: N/A;  with mesh   pyloric stenosis      testicle  57 years old   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 Authorizing Provider  albuterol (VENTOLIN HFA) 108 (90 Base) MCG/ACT inhaler Inhale 2 puffs into the lungs every 4 (four) hours as needed for wheezing or shortness of breath. 04/12/23  Yes Howard, Amber S, FNP  ALPRAZolam Prudy Feeler) 1 MG tablet Take 0.5 mg by mouth at bedtime.   Yes [provider]  azithromycin (ZITHROMAX) 250 MG tablet Take 2 tabs PO x 1 dose, then 1 tab PO QD x 4 days 04/17/23  Yes Bing Neighbors, NP  metoprolol succinate (TOPROL-XL) 25 MG 24 hr tablet TAKE 1 TABLET (25 MG TOTAL) BY MOUTH DAILY. 10/22/22  Yes Kurtis Bushman S, FNP  promethazine-dextromethorphan (PROMETHAZINE-DM) 6.25-15 MG/5ML syrup Take 5 mLs by mouth 3 (three) times daily as needed for cough. 04/17/23  Yes Bing Neighbors, NP  atorvastatin (LIPITOR) 40 MG tablet TAKE 1 TABLET BY MOUTH EVERY DAY Patient not taking: Reported on 04/09/2023 04/16/22   Levin Erp, MD  budesonide-formoterol [facility]) 80-4.5 MCG/ACT inhaler Inhale 2 puffs into the lungs in the morning and at bedtime. Patient not taking: Reported on 04/09/2023 01/07/23    Particia Nearing, PA-C  meloxicam (MOBIC) 15 MG tablet Take 1 tablet (15 mg total) by mouth daily. Patient not taking: Reported on 04/09/2023 11/26/22   Zenia Resides, MD  omeprazole (PRILOSEC) 20 MG capsule Take 1 capsule (20 mg total) by mouth daily. 04/09/23   Park Meo, FNP  predniSONE (DELTASONE) 20 MG tablet Take 2 tablets (40 mg total) by mouth daily with breakfast for 5 days. 04/17/23 04/22/23  Bing Neighbors, NP  tiZANidine (ZANAFLEX) 4 MG tablet Take 1 tablet (4 mg total) by mouth every 8 (eight) hours as needed for muscle spasms. Patient not taking: Reported on 04/09/2023 11/26/22   Zenia Resides, MD  dicyclomine (BENTYL) 10 MG capsule Take 2 capsules (20 mg total) by mouth 4 (four) times daily -  before meals and at bedtime. Take 20-30 minutes before meals Patient not taking: Reported on 05/26/2019 05/20/19 06/26/20  Unk Lightning, PA    Family History Family History  Problem Relation Age of Onset   Heart attack Mother 76       PTCA  and 3 stents   Heart disease Mother    COPD Mother    Anxiety disorder Mother    High blood pressure Mother    Kidney disease Mother    Drug abuse Brother        12 passed away from xanax   Colon cancer Brother        passed away at 89   Hodgkin's lymphoma Maternal Grandmother        47   Heart disease Maternal Grandfather    Pyloric stenosis Daughter 1       X2   Bipolar disorder Daughter    Cancer Maternal Aunt        pt unaware of what kind   Cancer Maternal Uncle        pt unaware of what kind   Anxiety disorder Other    Esophageal cancer Neg Hx    Rectal cancer Neg Hx    Stomach cancer Neg Hx     Social History Social History   Tobacco Use   Smoking status: Never    Passive exposure: Past   Smokeless tobacco: Never  Vaping Use   Vaping status: Never Used  Substance Use Topics   Alcohol use: No   Drug use: No     Allergies   Penicillins   Review of Systems Review of Systems   Respiratory:  Positive for cough, shortness of breath and wheezing.      Physical Exam Triage Vital Signs ED Triage Vitals  Encounter Vitals Group     BP --      Systolic BP Percentile --      Diastolic BP Percentile --      Pulse Rate 04/17/23 0817 80     Resp 04/17/23 0817 18  Temp 04/17/23 0817 98.3 F (36.8 C)     Temp src --      SpO2 04/17/23 0817 94 %     Weight --      Height --      Head Circumference --      Peak Flow --      Pain Score 04/17/23 0815 0     Pain Loc --      Pain Education --      Exclude from Growth Chart --    No data found.  Updated Vital Signs BP (!) 145/92 (BP Location: Left Arm)   Pulse 80   Temp 98.3 F (36.8 C)   Resp 18   SpO2 94%   Visual Acuity Right Eye Distance:   Left Eye Distance:   Bilateral Distance:    Right Eye Near:   Left Eye Near:    Bilateral Near:     Physical Exam Constitutional:      Appearance: He is ill-appearing.  HENT:     Head: Normocephalic and atraumatic.     Right Ear: External ear normal.     Left Ear: External ear normal.     Nose: Nose normal. No congestion or rhinorrhea.  Eyes:     Extraocular Movements: Extraocular movements intact.     Conjunctiva/sclera: Conjunctivae normal.     Pupils: Pupils are equal, round, and reactive to light.  Cardiovascular:     Rate and Rhythm: Normal rate and regular rhythm.  Pulmonary:     Effort: No respiratory distress.     Breath sounds: Decreased air movement present. Wheezing and rhonchi present.  Chest:     Chest wall: Tenderness present.  Musculoskeletal:     Cervical back: Normal range of motion and neck supple.  Skin:    General: Skin is warm.     Capillary Refill: Capillary refill takes less than 2 seconds.  Neurological:     General: No focal deficit present.     Mental Status: He is alert.      UC Treatments / Results  Labs (all labs ordered are listed, but only abnormal results are displayed) Labs Reviewed - No data to  display  EKG   Radiology No results found.  Procedures Procedures (including critical care time)  Medications Ordered in UC Medications  methylPREDNISolone sodium succinate (SOLU-MEDROL) 125 mg/2 mL injection 125 mg (125 mg Intramuscular Given 04/17/23 0847)  ipratropium-albuterol (DUONEB) 0.5-2.5 (3) MG/3ML nebulizer solution 3 mL (3 mLs Nebulization Given 04/17/23 0848)    Initial Impression / Assessment and Plan / UC Course  I have reviewed the triage vital signs and the nursing notes.  Pertinent labs & imaging results that were available during my care of the patient were reviewed by me and considered in my medical decision making (see chart for details).    Patient with a history of intermittent asthma with an acute asthma exacerbation likely secondary to upper respiratory infection that is now to a lower respiratory infection.  Patient treated here in clinic with Solu-Medrol 125 mg to active dyspnea and wheezing on presentation here to urgent care.  Chest x-ray shows no obvious pneumonia per this writer's review.  Radiology to provide over read. DuoNeb administered here in clinic to improve work of breathing.  On reevaluation patient reports improvement of work of breathing.  Given lower respiratory infection cover today with doxycycline 100 mg twice daily for 10 days and prednisone 40 mg once daily for 5 days.  For acute  cough Promethazine DM 3 times daily as needed for cough.  Strict ED precautions given if symptoms worsen or do not improve  Final Clinical Impressions(s) / UC Diagnoses   Final diagnoses:  Acute lower respiratory infection  Persistent asthma with acute exacerbation, unspecified asthma severity     Discharge Instructions      Chest x-ray shows no obvious pneumonia.  Continue home neb treatments every 6 hours and albuterol every 4 hours as needed. Treating you for acute asthma exacerbation and lower respiratory infection.  Prednisone 40 mg daily for 5 days.   You received a steroid injection here today so start oral prednisone tomorrow with breakfast.  Force plenty of fluids and rest.  You may return to work on Friday.     ED Prescriptions     Medication Sig Dispense Auth. Provider   predniSONE (DELTASONE) 20 MG tablet Take 2 tablets (40 mg total) by mouth daily with breakfast for 5 days. 10 tablet Bing Neighbors, NP   azithromycin (ZITHROMAX) 250 MG tablet Take 2 tabs PO x 1 dose, then 1 tab PO QD x 4 days 6 tablet Bing Neighbors, NP   promethazine-dextromethorphan (PROMETHAZINE-DM) 6.25-15 MG/5ML syrup Take 5 mLs by mouth 3 (three) times daily as needed for cough. 118 mL Bing Neighbors, NP      PDMP not reviewed this encounter.   Bing Neighbors, NP 04/17/23 [phone]

## 2023-04-17 NOTE — Discharge Instructions (Addendum)
Chest x-ray shows no obvious pneumonia.  Continue home neb treatments every 6 hours and albuterol every 4 hours as needed. Treating you for acute asthma exacerbation and lower respiratory infection.  Prednisone 40 mg daily for 5 days.  You received a steroid injection here today so start oral prednisone tomorrow with breakfast.  Force plenty of fluids and rest.  You may return to work on Friday.

## 2023-04-26 ENCOUNTER — Other Ambulatory Visit: Payer: Self-pay | Admitting: Family Medicine

## 2023-04-26 DIAGNOSIS — R002 Palpitations: Secondary | ICD-10-CM

## 2023-05-10 ENCOUNTER — Ambulatory Visit: Payer: Medicaid Other | Admitting: Gastroenterology

## 2023-05-10 ENCOUNTER — Encounter: Payer: Self-pay | Admitting: Gastroenterology

## 2023-05-10 VITALS — BP 152/84 | HR 80 | Wt 162.0 lb

## 2023-05-10 DIAGNOSIS — R1013 Epigastric pain: Secondary | ICD-10-CM | POA: Diagnosis not present

## 2023-05-10 DIAGNOSIS — R1310 Dysphagia, unspecified: Secondary | ICD-10-CM

## 2023-05-10 DIAGNOSIS — Z860101 Personal history of adenomatous and serrated colon polyps: Secondary | ICD-10-CM

## 2023-05-10 DIAGNOSIS — Z800 Family history of malignant neoplasm of digestive organs: Secondary | ICD-10-CM

## 2023-05-10 DIAGNOSIS — Z860100 Personal history of colon polyps, unspecified: Secondary | ICD-10-CM | POA: Insufficient documentation

## 2023-05-10 MED ORDER — NA SULFATE-K SULFATE-MG SULF 17.5-3.13-1.6 GM/177ML PO SOLN: 1.0000 | Freq: Once | ORAL | 0 refills | Status: AC

## 2023-05-10 NOTE — Progress Notes (Signed)
 Agree with assessment and plan as outlined.

## 2023-05-10 NOTE — Progress Notes (Signed)
 05/10/2023 Adam [name] [id number] 13-Mar-1966   HISTORY OF PRESENT ILLNESS: This is a 58 year old male who is a patient of Dr. Hassan.  He is here today to discuss and schedule EGD and colonoscopy.  He had colonoscopy in January 2021 as below with several adenomas removed.  Also has family history of colon cancer in his brother.  Says that he has also been having issues with some epigastric abdominal pain.  He says that he feels very full even when he does not eat.  Says that he feels like food gets stuck in the middle of his sternum.  Says it feels like there is something growing there.  Is not on any PPI therapy as he does not really like to take medication.  Says that his bowel movements alternate between constipation and loose stool.  He says that he always has the sensation that he has to have a bowel movement even then if he does not go.  He complains of a burning sensation inside his abdomen.  Colonoscopy 05/2019:  - The examined portion of the ileum was normal. - One 3 mm polyp in the ascending colon, removed with a cold snare. Resected and retrieved. - Two 3 to 5 mm polyps in the transverse colon, removed with a cold snare. Resected and retrieved. - Three 3 mm polyps in the sigmoid colon, removed with a cold snare. Resected and retrieved. - Diverticulosis in the sigmoid colon. - The examination was otherwise normal.  Surgical [P], colon, sigmoid, transverse and ascending, polyp (6) - TUBULAR ADENOMA, NEGATIVE FOR HIGH GRADE DYSPLASIA (X MULTIPLE FRAGMENTS).  No obvious cause for pain on this exam. Symptoms don' t seem related to bowel movements / constipation as he reports things today. Musculoskeletal / neuropathic pain could be possible source of the patient' s symptoms.  Repeat recommended in 3 years.  He was actually referred back here on this occasion by Jeoffrey Barrio, FNP, for colon cancer screening and GERD.  Past Medical History:  Diagnosis Date   Anxiety  state, unspecified    BACK PAIN, CHRONIC 06/09/2010   Qualifier: Diagnosis of  By: Eldonna MD, Steven     Bilateral inguinal hernia 02/26/2011   Dental abscess 02/15/2011   Esophageal reflux    no meds. per pt.   Family history of stress    Generalized headaches    Hiatal hernia    pt. denies nerve/mucscle disease   Hypertension    IBS (irritable bowel syndrome)    Major depressive disorder, single episode, unspecified    Onychomycosis 01/18/2014   Palpitations    Panic disorder without agoraphobia    Prostatitis, unspecified    RBBB (right bundle branch block)    Unspecified cardiovascular disease    Visual disturbance    Past Surgical History:  Procedure Laterality Date   COLONOSCOPY  07/26/2005   normal   COLONOSCOPY  05/26/2019   6 small adenomas noted on this colonoscopy, repeat in 3 years for surveillance   ESOPHAGOGASTRODUODENOSCOPY  01/03/2005   normal   FRACTURE SURGERY  2000   left arm   HERNIA REPAIR  03/14/2011   BIH   INGUINAL HERNIA REPAIR  03/14/2011   Procedure: LAPAROSCOPIC BILATERAL INGUINAL HERNIA REPAIR;  Surgeon: Vicenta DELENA Poli, MD;  Location: [facility];  Service: General;  Laterality: N/A;  with mesh   pyloric stenosis      testicle  58 years old   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 He has been exposed to tobacco smoke. He has never used smokeless tobacco. He reports that he does not drink alcohol and does not use drugs. family history includes Anxiety disorder in his mother and another family member; Bipolar disorder in his daughter; COPD in his mother; Cancer in his maternal aunt and maternal uncle; Colon cancer in his brother; Drug abuse in his brother; Heart attack (age of onset: 83) in his mother; Heart disease in his maternal grandfather and mother; High blood pressure in his mother; Hodgkin's lymphoma in his maternal grandmother; Kidney disease in his mother; Pyloric stenosis (age of onset: 34)  in his daughter. Allergies  Allergen Reactions   Penicillins Other (See Comments)    Pt has family history of allergy but does not remember ever taking this      Outpatient Encounter Medications as of 05/10/2023  Medication Sig   albuterol  (VENTOLIN  HFA) 108 (90 Base) MCG/ACT inhaler Inhale 2 puffs into the lungs every 4 (four) hours as needed for wheezing or shortness of breath.   ALPRAZolam  (XANAX ) 1 MG tablet Take 0.5 mg by mouth at bedtime.   atorvastatin  (LIPITOR) 40 MG tablet TAKE 1 TABLET BY MOUTH EVERY DAY   budesonide -formoterol  (SYMBICORT ) 80-4.5 MCG/ACT inhaler Inhale 2 puffs into the lungs in the morning and at bedtime.   metoprolol  succinate (TOPROL -XL) 25 MG 24 hr tablet TAKE 1 TABLET (25 MG TOTAL) BY MOUTH DAILY.   azithromycin  (ZITHROMAX ) 250 MG tablet Take 2 tabs PO x 1 dose, then 1 tab PO QD x 4 days (Patient not taking: Reported on 05/10/2023)   meloxicam  (MOBIC ) 15 MG tablet Take 1 tablet (15 mg total) by mouth daily. (Patient not taking: Reported on 05/10/2023)   omeprazole  (PRILOSEC) 20 MG capsule Take 1 capsule (20 mg total) by mouth daily. (Patient not taking: Reported on 05/10/2023)   promethazine -dextromethorphan (PROMETHAZINE -DM) 6.25-15 MG/5ML syrup Take 5 mLs by mouth 3 (three) times daily as needed for cough. (Patient not taking: Reported on 05/10/2023)   tiZANidine  (ZANAFLEX ) 4 MG tablet Take 1 tablet (4 mg total) by mouth every 8 (eight) hours as needed for muscle spasms. (Patient not taking: Reported on 05/10/2023)   [DISCONTINUED] dicyclomine  (BENTYL ) 10 MG capsule Take 2 capsules (20 mg total) by mouth 4 (four) times daily -  before meals and at bedtime. Take 20-30 minutes before meals (Patient not taking: Reported on 05/26/2019)   No facility-administered encounter medications on file as of 05/10/2023.    REVIEW OF SYSTEMS  : All other systems reviewed and negative except where noted in the History of Present Illness.   PHYSICAL EXAM: BP (!) 152/84   Pulse 80   Wt  162 lb (73.5 kg)   BMI 26.15 kg/m  General: Well developed white male in no acute distress Head: Normocephalic and atraumatic Eyes:  Sclerae anicteric, conjunctiva pink. Ears: Normal auditory acuity Lungs: Clear throughout to auscultation; no W/R/R. Heart: Regular rate and rhythm; no M/R/G. Abdomen: Soft, non-distended.  BS present.  TTP in epigastrium.  Scar noted in RUQ.  Lipoma palpated in the RUQ as well. Rectal:  Will be done at the time of colonoscopy. Musculoskeletal: Symmetrical with no gross deformities  Skin: No lesions on visible extremities Extremities: No edema  Neurological: Alert oriented x 4, grossly non-focal Psychological:  Alert and cooperative. Normal mood and affect  ASSESSMENT AND PLAN: *Personal history of colon polyps: Had several adenomas removed on colonoscopy in January 2021.  Repeat recommended in 3  years.  Will schedule with Dr. Leigh. *Family history of colon cancer: Brother had colon cancer. *Epigastric abdominal pain and dysphagia: Describes pain in the epigastrium.  Feels like food sticks in that area and says it feels like something is growing there.  Also feels full all the time.  Will plan for EGD as well.  **The risks, benefits, and alternatives to EGD and colonoscopy were discussed with the patient and he consents to proceed.  CC:  Kayla Jeoffrey RAMAN, FNP

## 2023-05-10 NOTE — Patient Instructions (Signed)
 You have been scheduled for an endoscopy and colonoscopy. Please follow the written instructions given to you at your visit today.  Please pick up your prep supplies at the pharmacy within the next 1-3 days.  If you use inhalers (even only as needed), please bring them with you on the day of your procedure.  DO NOT TAKE 7 DAYS PRIOR TO TEST- Trulicity (dulaglutide) Ozempic, Wegovy (semaglutide) Mounjaro (tirzepatide) Bydureon Bcise (exanatide extended release)  DO NOT TAKE 1 DAY PRIOR TO YOUR TEST Rybelsus (semaglutide) Adlyxin (lixisenatide) Victoza (liraglutide) Byetta (exanatide) ___________________________________________________________________  _______________________________________________________  If your blood pressure at your visit was 140/90 or greater, please contact your primary care physician to follow up on this.  _______________________________________________________  If you are age 59 or older, your body mass index should be between 23-30. Your Body mass index is 26.15 kg/m. If this is out of the aforementioned range listed, please consider follow up with your Primary Care Provider.  If you are age 80 or younger, your body mass index should be between 19-25. Your Body mass index is 26.15 kg/m. If this is out of the aformentioned range listed, please consider follow up with your Primary Care Provider.   ________________________________________________________  The [facility] providers would like to encourage you to use MYCHART to communicate with providers for non-urgent requests or questions.  Due to long hold times on the telephone, sending your provider a message by [facility] may be a faster and more efficient way to get a response.  Please allow 48 business hours for a response.  Please remember that this is for non-urgent requests.  _______________________________________________________

## 2023-06-26 ENCOUNTER — Encounter: Payer: Medicaid Other | Admitting: Gastroenterology

## 2023-06-26 ENCOUNTER — Telehealth: Payer: Self-pay | Admitting: *Deleted

## 2023-06-26 NOTE — Telephone Encounter (Signed)
 New prep instructions sent to patient VIA mychart. A sample of SUTAB was left with the receptionist for the patient to pickup. He verbalized understanding.

## 2023-07-11 NOTE — Progress Notes (Unsigned)
 error

## 2023-07-11 NOTE — Progress Notes (Deleted)
 error

## 2023-07-13 ENCOUNTER — Other Ambulatory Visit: Payer: Self-pay | Admitting: Family Medicine

## 2023-07-13 DIAGNOSIS — K219 Gastro-esophageal reflux disease without esophagitis: Secondary | ICD-10-CM

## 2023-07-31 ENCOUNTER — Ambulatory Visit: Payer: Medicaid Other | Admitting: Gastroenterology

## 2023-07-31 ENCOUNTER — Encounter: Payer: Self-pay | Admitting: Gastroenterology

## 2023-07-31 VITALS — BP 141/76 | HR 60 | Temp 98.1°F | Resp 18 | Ht 66.0 in | Wt 162.0 lb

## 2023-07-31 DIAGNOSIS — K5730 Diverticulosis of large intestine without perforation or abscess without bleeding: Secondary | ICD-10-CM

## 2023-07-31 DIAGNOSIS — R6881 Early satiety: Secondary | ICD-10-CM

## 2023-07-31 DIAGNOSIS — K2980 Duodenitis without bleeding: Secondary | ICD-10-CM | POA: Diagnosis not present

## 2023-07-31 DIAGNOSIS — K3189 Other diseases of stomach and duodenum: Secondary | ICD-10-CM

## 2023-07-31 DIAGNOSIS — Z860101 Personal history of adenomatous and serrated colon polyps: Secondary | ICD-10-CM

## 2023-07-31 DIAGNOSIS — R1013 Epigastric pain: Secondary | ICD-10-CM

## 2023-07-31 DIAGNOSIS — D123 Benign neoplasm of transverse colon: Secondary | ICD-10-CM

## 2023-07-31 DIAGNOSIS — K229 Disease of esophagus, unspecified: Secondary | ICD-10-CM

## 2023-07-31 DIAGNOSIS — K2289 Other specified disease of esophagus: Secondary | ICD-10-CM | POA: Diagnosis not present

## 2023-07-31 DIAGNOSIS — D130 Benign neoplasm of esophagus: Secondary | ICD-10-CM

## 2023-07-31 DIAGNOSIS — K449 Diaphragmatic hernia without obstruction or gangrene: Secondary | ICD-10-CM

## 2023-07-31 DIAGNOSIS — K319 Disease of stomach and duodenum, unspecified: Secondary | ICD-10-CM

## 2023-07-31 DIAGNOSIS — Z1211 Encounter for screening for malignant neoplasm of colon: Secondary | ICD-10-CM

## 2023-07-31 DIAGNOSIS — Z800 Family history of malignant neoplasm of digestive organs: Secondary | ICD-10-CM

## 2023-07-31 DIAGNOSIS — Z860100 Personal history of colon polyps, unspecified: Secondary | ICD-10-CM

## 2023-07-31 DIAGNOSIS — D125 Benign neoplasm of sigmoid colon: Secondary | ICD-10-CM | POA: Diagnosis not present

## 2023-07-31 MED ORDER — SODIUM CHLORIDE 0.9 % IV SOLN: 500.0000 mL | Freq: Once | INTRAVENOUS | Status: DC

## 2023-07-31 NOTE — Progress Notes (Signed)
 [facility] History and Physical   Primary Care Physician:  Park Meo, FNP   Reason for Procedure:   epigastric pain / early satiety, history of colon polyps  Plan:    EGD + colonoscopy     HPI: Adam [name] is a 58 y.o. male  here for EGD and colonoscopy.  See office visit 05/10/23 for details. Occasional epigastric pain, also with early satiety at times. In the office he had endorsed dysphagia but by this he meant early satiety. Denies dysphagia today. On omeprazole. EGD to further evaluate. History of 4 adenomas removed 05/2019 - here for surveillance colonoscopy. Brother had colon cancer dx age 86s.   Patient denies any bowel symptoms at this time. Otherwise feels well without any cardiopulmonary symptoms.   I have discussed risks / benefits of anesthesia and endoscopic procedure with Adam [name] and they wish to proceed with the exams as outlined today.    Past Medical History:  Diagnosis Date   Anxiety state, unspecified    Asthma    BACK PAIN, CHRONIC 06/09/2010   Qualifier: Diagnosis of  By: Alvester Morin MD, Emoree Sasaki     Bilateral inguinal hernia 02/26/2011   Dental abscess 02/15/2011   Esophageal reflux    no meds. per pt.   Family history of stress    Generalized headaches    Hiatal hernia    pt. denies nerve/mucscle disease   Hypertension    IBS (irritable bowel syndrome)    Major depressive disorder, single episode, unspecified    Onychomycosis 01/18/2014   Palpitations    Panic disorder without agoraphobia    Prostatitis, unspecified    RBBB (right bundle branch block)    Unspecified cardiovascular disease    Visual disturbance     Past Surgical History:  Procedure Laterality Date   COLONOSCOPY  07/26/2005   normal   COLONOSCOPY  05/26/2019   6 small adenomas noted on this colonoscopy, repeat in 3 years for surveillance   ESOPHAGOGASTRODUODENOSCOPY  01/03/2005   normal   FRACTURE SURGERY  2000   left arm   HERNIA REPAIR   03/14/2011   BIH   INGUINAL HERNIA REPAIR  03/14/2011   Procedure: LAPAROSCOPIC BILATERAL INGUINAL HERNIA REPAIR;  Surgeon: Shelly Rubenstein, MD;  Location: [facility];  Service: General;  Laterality: N/A;  with mesh   pyloric stenosis      testicle  58 years old   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 Authorizing Provider  ALPRAZolam Prudy Feeler) 1 MG tablet Take 0.5 mg by mouth at bedtime.   Yes [provider]  metoprolol succinate (TOPROL-XL) 25 MG 24 hr tablet TAKE 1 TABLET (25 MG TOTAL) BY MOUTH DAILY. 04/29/23  Yes Park Meo, FNP  albuterol (VENTOLIN HFA) 108 (90 Base) MCG/ACT inhaler Inhale 2 puffs into the lungs every 4 (four) hours as needed for wheezing or shortness of breath. 04/12/23   Park Meo, FNP  atorvastatin (LIPITOR) 40 MG tablet TAKE 1 TABLET BY MOUTH EVERY DAY 04/16/22   Levin Erp, MD  azithromycin (ZITHROMAX) 250 MG tablet Take 2 tabs PO x 1 dose, then 1 tab PO QD x 4 days Patient not taking: Reported on 07/31/2023 04/17/23   Bing Neighbors, NP  budesonide-formoterol [facility]) 80-4.5 MCG/ACT inhaler Inhale 2 puffs into the lungs in the morning and at bedtime. 01/07/23   Particia Nearing, PA-C  meloxicam (  MOBIC) 15 MG tablet Take 1 tablet (15 mg total) by mouth daily. Patient not taking: Reported on 04/09/2023 11/26/22   Zenia Resides, MD  omeprazole (PRILOSEC) 20 MG capsule TAKE 1 CAPSULE BY MOUTH EVERY DAY 07/15/23   Park Meo, FNP  promethazine-dextromethorphan (PROMETHAZINE-DM) 6.25-15 MG/5ML syrup Take 5 mLs by mouth 3 (three) times daily as needed for cough. Patient not taking: Reported on 07/31/2023 04/17/23   Bing Neighbors, NP  tiZANidine (ZANAFLEX) 4 MG tablet Take 1 tablet (4 mg total) by mouth every 8 (eight) hours as needed for muscle spasms. Patient not taking: Reported on 07/31/2023 11/26/22   Zenia Resides, MD  dicyclomine  (BENTYL) 10 MG capsule Take 2 capsules (20 mg total) by mouth 4 (four) times daily -  before meals and at bedtime. Take 20-30 minutes before meals Patient not taking: Reported on 05/26/2019 05/20/19 06/26/20  Unk Lightning, PA    Current Outpatient Medications  Medication Sig Dispense Refill   ALPRAZolam (XANAX) 1 MG tablet Take 0.5 mg by mouth at bedtime.     metoprolol succinate (TOPROL-XL) 25 MG 24 hr tablet TAKE 1 TABLET (25 MG TOTAL) BY MOUTH DAILY. 90 tablet 1   albuterol (VENTOLIN HFA) 108 (90 Base) MCG/ACT inhaler Inhale 2 puffs into the lungs every 4 (four) hours as needed for wheezing or shortness of breath. 18 each 1   atorvastatin (LIPITOR) 40 MG tablet TAKE 1 TABLET BY MOUTH EVERY DAY 90 tablet 3   azithromycin (ZITHROMAX) 250 MG tablet Take 2 tabs PO x 1 dose, then 1 tab PO QD x 4 days (Patient not taking: Reported on 07/31/2023) 6 tablet 0   budesonide-formoterol (SYMBICORT) 80-4.5 MCG/ACT inhaler Inhale 2 puffs into the lungs in the morning and at bedtime. 1 each 0   meloxicam (MOBIC) 15 MG tablet Take 1 tablet (15 mg total) by mouth daily. (Patient not taking: Reported on 04/09/2023) 30 tablet 0   omeprazole (PRILOSEC) 20 MG capsule TAKE 1 CAPSULE BY MOUTH EVERY DAY 90 capsule 1   promethazine-dextromethorphan (PROMETHAZINE-DM) 6.25-15 MG/5ML syrup Take 5 mLs by mouth 3 (three) times daily as needed for cough. (Patient not taking: Reported on 07/31/2023) 118 mL 0   tiZANidine (ZANAFLEX) 4 MG tablet Take 1 tablet (4 mg total) by mouth every 8 (eight) hours as needed for muscle spasms. (Patient not taking: Reported on 07/31/2023) 15 tablet 0   Current Facility-Administered Medications  Medication Dose Route Frequency Provider Last Rate Last Admin   0.9 %  sodium chloride infusion  500 mL Intravenous Once Trevione Wert, Willaim Rayas, MD        Allergies as of 07/31/2023 - Review Complete 07/31/2023  Allergen Reaction Noted   Penicillins Other (See Comments) 07/17/2007    Family  History  Problem Relation Age of Onset   Heart attack Mother 61       PTCA  and 3 stents   Heart disease Mother    COPD Mother    Anxiety disorder Mother    High blood pressure Mother    Kidney disease Mother    Drug abuse Brother        14 passed away from xanax   Colon cancer Brother        passed away at 89   Hodgkin's lymphoma Maternal Grandmother        70   Heart disease Maternal Grandfather    Pyloric stenosis Daughter 44       X2   Bipolar disorder  Daughter    Cancer Maternal Aunt        pt unaware of what kind   Cancer Maternal Uncle        pt unaware of what kind   Anxiety disorder Other    Esophageal cancer Neg Hx    Rectal cancer Neg Hx    Stomach cancer Neg Hx     Social History   Socioeconomic History   Marital status: Married    Spouse name: Not on file   Number of children: 9   Years of education: Not on file   Highest education level: Not on file  Occupational History   Occupation: Scientist, water quality    Employer: [employer]  Tobacco Use   Smoking status: Never    Passive exposure: Past   Smokeless tobacco: Never  Vaping Use   Vaping status: Never Used  Substance and Sexual Activity   Alcohol use: No   Drug use: No   Sexual activity: Yes    Partners: Female  Other Topics Concern   Not on file  Social History Narrative   Says current working in [employer]. Brick mason x 83yrs.  He has nine children, 2 from first marriage, and 7 from 3rd. Lives with wife 59, and 3 sons 49, 75, and 94 years old. Denies smoking, EtOH, recreational drugs, + remote h/o marijuana use when he was in his early 64s. Owns a car, does not exercise regularly. Fishing, boating, and camping for fun. Stressors are financial but can afford food and housing.   Social Drivers of Corporate investment banker Strain: Not on file  Food Insecurity: Not on file  Transportation Needs: Not on file  Physical Activity: Not on file  Stress: Not on file  Social Connections: Not on file   Intimate Partner Violence: Not on file    Review of Systems: All other review of systems negative except as mentioned in the HPI.  Physical Exam: Vital signs BP (!) 162/99   Pulse 68   Temp 98.1 F (36.7 C)   Ht 5\' 6"  (1.676 m)   Wt 162 lb (73.5 kg)   SpO2 97%   BMI 26.15 kg/m   General:   Alert,  Well-developed, pleasant and cooperative in NAD Lungs:  Clear throughout to auscultation.   Heart:  Regular rate and rhythm Abdomen:  Soft, nontender and nondistended.   Neuro/Psych:  Alert and cooperative. Normal mood and affect. A and O x 3  Harlin Rain, MD [facility]

## 2023-07-31 NOTE — Progress Notes (Signed)
 Pt A/O x 3, gd SR's, pleased with anesthesia, report to RN

## 2023-07-31 NOTE — Patient Instructions (Signed)

## 2023-07-31 NOTE — Op Note (Signed)
 [facility] Patient Name: Adam [name] Procedure Date: 07/31/2023 2:36 PM MRN: [id number] Endoscopist: Viviann Spare P. Adela Lank , MD, [id number] Age: 58 Referring MD:  Date of Birth: [birth year] Gender: Male Account #: [id number] Procedure:                Upper GI endoscopy Indications:              Epigastric abdominal pain / Dyspepsia, Early                            satiety - prescribed omeprazole but not taking                            routinely Medicines:                Monitored Anesthesia Care Procedure:                Pre-Anesthesia Assessment:                           - Prior to the procedure, a History and Physical                            was performed, and patient medications and                            allergies were reviewed. The patient's tolerance of                            previous anesthesia was also reviewed. The risks                            and benefits of the procedure and the sedation                            options and risks were discussed with the patient.                            All questions were answered, and informed consent                            was obtained. Prior Anticoagulants: The patient has                            taken no anticoagulant or antiplatelet agents. ASA                            Grade Assessment: III - A patient with severe                            systemic disease. After reviewing the risks and                            benefits, the patient was deemed in satisfactory  condition to undergo the procedure.                           After obtaining informed consent, the endoscope was                            passed under direct vision. Throughout the                            procedure, the patient's blood pressure, pulse, and                            oxygen saturations were monitored continuously. The                            Olympus Scope [id number] was introduced  through the                            mouth, and advanced to the second part of duodenum.                            The upper GI endoscopy was accomplished without                            difficulty. The patient tolerated the procedure                            well. Scope In: Scope Out: Findings:                 Esophagogastric landmarks were identified: the                            Z-line was found at 37 cm, the gastroesophageal                            junction was found at 37 cm and the site of hiatal                            narrowing was found at 40 cm from the incisors.                           A 3 cm hiatal hernia was present.                           A single 6 mm mucosal nodule was found at the                            gastroesophageal junction, arising from the cardia                            / hernia sac. Suspect inflammatory in etiology, but  biopsies were taken with a cold forceps for                            histology.                           A single 2-3 mm flat polyp was found 30 cm from the                            incisors. The polyp was removed with a cold biopsy                            forceps. Resection and retrieval were complete.                           There was a benign gastric inlet patch in the                            proximal esophagus. The exam of the esophagus was                            otherwise normal.                           The entire examined stomach was normal. Biopsies                            were taken with a cold forceps for Helicobacter                            pylori testing.                           Mucosal changes were found in the duodenal bulb -                            prominent / protuberant erythematous area / fold in                            the distal bulb, did not appear to be a polyp.                            Biopsies were taken with a cold forceps for                             histology.                           The exam of the duodenum was otherwise normal. Complications:            No immediate complications. Estimated blood loss:                            Minimal. Estimated Blood Loss:     Estimated blood loss  was minimal. Impression:               - Esophagogastric landmarks identified.                           - 3 cm hiatal hernia.                           - Mucosal nodule found at the GEJ / hernia sac.                            Biopsied.                           - Esophageal polyp was found. Benign appearing.                            Resected and retrieved.                           - Normal stomach otherwise.                           - Normal stomach. Biopsied.                           - Mucosal changes in the duodenal bulb as outlined.                            Biopsied.                           - Normal duodenum otherwise. Recommendation:           - Patient has a contact number available for                            emergencies. The signs and symptoms of potential                            delayed complications were discussed with the                            patient. Return to normal activities tomorrow.                            Written discharge instructions were provided to the                            patient.                           - Resume previous diet.                           - Continue present medications.                           -  Take omeprazole daily if not yet tried yet                           - Await pathology results. Viviann Spare P. Adela Lank, MD 07/31/2023 3:26:20 PM This report has been signed electronically.

## 2023-07-31 NOTE — Progress Notes (Signed)
 Pt's states no medical or surgical changes since previsit or office visit.

## 2023-07-31 NOTE — Op Note (Signed)
 [name] Patient Name: Adam [name] Procedure Date: 07/31/2023 2:35 PM MRN: [id number] Endoscopist: Viviann Spare P. Adela Lank , MD, [id number] Age: 58 Referring MD:  Date of Birth: July 21, 1965 Gender: Male Account #: [id number] Procedure:                Colonoscopy Indications:              High risk colon cancer surveillance: Personal                            history of colonic polyps - 4 adenomas removed                            05/2019. Brother had colon cancer dx age 26s Medicines:                Monitored Anesthesia Care Procedure:                Pre-Anesthesia Assessment:                           - Prior to the procedure, a History and Physical                            was performed, and patient medications and                            allergies were reviewed. The patient's tolerance of                            previous anesthesia was also reviewed. The risks                            and benefits of the procedure and the sedation                            options and risks were discussed with the patient.                            All questions were answered, and informed consent                            was obtained. Prior Anticoagulants: The patient has                            taken no anticoagulant or antiplatelet agents. ASA                            Grade Assessment: III - A patient with severe                            systemic disease. After reviewing the risks and                            benefits, the patient was deemed in satisfactory  condition to undergo the procedure.                           After obtaining informed consent, the colonoscope                            was passed under direct vision. Throughout the                            procedure, the patient's blood pressure, pulse, and                            oxygen saturations were monitored continuously. The                            [id number]  Colonoscope [id number] was introduced                            through the anus and advanced to the the cecum,                            identified by appendiceal orifice and ileocecal                            valve. The colonoscopy was performed without                            difficulty. The patient tolerated the procedure                            well. The quality of the bowel preparation was                            adequate. The ileocecal valve, appendiceal orifice,                            and rectum were photographed. Scope In: 3:01:31 PM Scope Out: 3:14:45 PM Scope Withdrawal Time: 0 hours 10 minutes 45 seconds  Total Procedure Duration: 0 hours 13 minutes 14 seconds  Findings:                 The perianal and digital rectal examinations were                            normal.                           A diminutive polyp was found in the transverse                            colon. The polyp was sessile. The polyp was removed                            with a cold snare. Resection and retrieval were  complete.                           A 3 mm polyp was found in the distal sigmoid colon.                            The polyp was sessile. The polyp was removed with a                            cold snare. Resection and retrieval were complete.                           A few small-mouthed diverticula were found in the                            sigmoid colon.                           The exam was otherwise without abnormality. Complications:            No immediate complications. Estimated blood loss:                            Minimal. Estimated Blood Loss:     Estimated blood loss was minimal. Impression:               - One diminutive polyp in the transverse colon,                            removed with a cold snare. Resected and retrieved.                           - One 3 mm polyp in the distal sigmoid colon,                             removed with a cold snare. Resected and retrieved.                           - Diverticulosis in the sigmoid colon.                           - The examination was otherwise normal. Recommendation:           - Patient has a contact number available for                            emergencies. The signs and symptoms of potential                            delayed complications were discussed with the                            patient. Return to normal activities tomorrow.  Written discharge instructions were provided to the                            patient.                           - Resume previous diet.                           - Continue present medications.                           - Await pathology results. Anticipate repeat                            colonoscopy in 5 years given strong family history                            of colon cancer. Viviann Spare P. Remy Dia, MD 07/31/2023 3:18:56 PM This report has been signed electronically.

## 2023-07-31 NOTE — Progress Notes (Signed)
 Called to room to assist during endoscopic procedure.  Patient ID and intended procedure confirmed with present staff. Received instructions for my participation in the procedure from the performing physician.

## 2023-08-01 ENCOUNTER — Telehealth: Payer: Self-pay

## 2023-08-01 NOTE — Telephone Encounter (Signed)
  Follow up Call-     07/31/2023    1:25 PM  Call back number  Post procedure Call Back phone  # [phone]  Permission to leave phone message Yes     Patient questions:  Do you have a fever, pain , or abdominal swelling? No. Pain Score  0 *  Have you tolerated food without any problems? Yes.    Have you been able to return to your normal activities? Yes.    Do you have any questions about your discharge instructions: Diet   No. Medications  No. Follow up visit  No.  Do you have questions or concerns about your Care? No.  Actions: * If pain score is 4 or above: No action needed, pain <4.

## 2023-08-05 LAB — SURGICAL PATHOLOGY

## 2023-08-12 ENCOUNTER — Encounter: Payer: Self-pay | Admitting: Gastroenterology

## 2023-09-05 ENCOUNTER — Ambulatory Visit: Payer: Self-pay

## 2023-09-05 NOTE — Telephone Encounter (Signed)
 Copied from CRM [id number]. Topic: Clinical - Red Word Triage >> Sep 05, 2023 10:52 AM [place] wrote: Red Word that prompted transfer to Nurse Triage: no energy, light headed and dizzy   Chief Complaint: dizziness, feeling off Symptoms: fatigue, weakness, dizziness, some confusion in "can't remember something did yesterday," joint pain, trouble breathing Frequency: constant Pertinent Negatives: Patient denies headache, "pressure like when BP high," fever, changes in chest pain, changes to heart palpitations, changes to SOB, too weak to stand, new numbness/tingling, weakness to one side of body, fever, vision changes, slurred speech Disposition: [] 911 / [] ED /[] Urgent Care (no appt availability in office) / [] Appointment(In office/virtual)/ []  [facility]/ [] Home Care/ [x] Refused Recommended Disposition /[]  Mobile Bus/ []  Follow-up with PCP Additional Notes: Pt is poor historian. Pt reporting that he is feeling not like his normal self, experiencing lightheadedness that is now constant, "somewhat confusion," "trouble breathing," "so tired and drained," "feel like everything's not real," "feel like talking slow but feel like can't hear myself talk," "kinda" feels like about to pass out at times. Pt reporting that he has "always had chest pains" and denies more frequency or intensity, has "flutters" in his heart a lot but claims "like normal," pt states "kind of always" SOB at times, pt reporting he has numbness and tingling to right leg/foot because of sciatic nerve but denies new numbness/tingling. Pt confirms "aches and pains more with joints right now." Pt also confirms hx of "hypertension, prediabetes, hiatal hernia, and right bundle branch block," doesn't think related to hypertension, states he is unsure if related to prediabetes. Advised pt be examined in hospital asap. Pt refusing ED due to wait at "[facility]." Advised strongly that pt be examined in hospital for more  severe symptoms. Pt refusing, alerted [facility] of hospital/ED refusal, no appts appropriate to offer at this time per [facility]. Informed pt, advised go to ED, get to hospital especially if worsening, go to UC if refusing ED. Pt verbalized understanding, did not confirm or deny intent to seek immediate care. Please advise.  Reason for Disposition  Difficult to awaken or acting confused (e.g., disoriented, slurred speech)  Answer Assessment - Initial Assessment Questions 1. DESCRIPTION: "Describe your dizziness."     Really feel like I'm walking on an air mattress, lightheaded, somewhat confusion, can't remember something like did yesterday, trouble breathing, feel drained, so tired and drained, hard to sleep at night, no improvement from sleep, feel like don't have energy to walk or talk, feel like everything's not real, right now constant, feel like talking slow but feel like can't hear myself talk 4. SEVERITY: "How bad is it?"  "Do you feel like you are going to faint?" "Can you stand and walk?"   - MILD: Feels slightly dizzy, but walking normally.   - MODERATE: Feels unsteady when walking, but not falling; interferes with normal activities (e.g., school, work).   - SEVERE: Unable to walk without falling, or requires assistance to walk without falling; feels like passing out now.      Kind of feel like about to pass out 5. ONSET:  "When did the dizziness begin?"     Tuesday 10. OTHER SYMPTOMS: "Do you have any other symptoms?" (e.g., fever, chest pain, vomiting, diarrhea, bleeding)       No fever, always had chest pains Don't drink a lot of fluids Sciatic nerve on right side leg foot, numbness and tingling, sometimes back has more aches and pains, Aches and pains more with joints  right now Kind of always SOB at times Flutters like normal No changes meds  Protocols used: Dizziness - Lightheadedness-A-AH

## 2023-10-08 ENCOUNTER — Other Ambulatory Visit: Payer: Self-pay | Admitting: Family Medicine

## 2023-10-08 DIAGNOSIS — R002 Palpitations: Secondary | ICD-10-CM

## 2023-11-14 ENCOUNTER — Ambulatory Visit: Attending: Family Medicine

## 2023-11-14 ENCOUNTER — Ambulatory Visit (INDEPENDENT_AMBULATORY_CARE_PROVIDER_SITE_OTHER): Admitting: Family Medicine

## 2023-11-14 ENCOUNTER — Encounter: Payer: Self-pay | Admitting: Family Medicine

## 2023-11-14 VITALS — BP 138/88 | HR 63 | Temp 97.8°F | Ht 66.0 in | Wt 152.6 lb

## 2023-11-14 DIAGNOSIS — R0602 Shortness of breath: Secondary | ICD-10-CM

## 2023-11-14 DIAGNOSIS — Z0001 Encounter for general adult medical examination with abnormal findings: Secondary | ICD-10-CM

## 2023-11-14 DIAGNOSIS — K581 Irritable bowel syndrome with constipation: Secondary | ICD-10-CM

## 2023-11-14 DIAGNOSIS — M5431 Sciatica, right side: Secondary | ICD-10-CM

## 2023-11-14 DIAGNOSIS — Z8639 Personal history of other endocrine, nutritional and metabolic disease: Secondary | ICD-10-CM

## 2023-11-14 DIAGNOSIS — Z125 Encounter for screening for malignant neoplasm of prostate: Secondary | ICD-10-CM

## 2023-11-14 DIAGNOSIS — I10 Essential (primary) hypertension: Secondary | ICD-10-CM

## 2023-11-14 DIAGNOSIS — K219 Gastro-esophageal reflux disease without esophagitis: Secondary | ICD-10-CM | POA: Diagnosis not present

## 2023-11-14 DIAGNOSIS — J45909 Unspecified asthma, uncomplicated: Secondary | ICD-10-CM

## 2023-11-14 DIAGNOSIS — R7303 Prediabetes: Secondary | ICD-10-CM | POA: Diagnosis not present

## 2023-11-14 DIAGNOSIS — E7800 Pure hypercholesterolemia, unspecified: Secondary | ICD-10-CM

## 2023-11-14 DIAGNOSIS — R002 Palpitations: Secondary | ICD-10-CM

## 2023-11-14 DIAGNOSIS — Z0000 Encounter for general adult medical examination without abnormal findings: Secondary | ICD-10-CM

## 2023-11-14 MED ORDER — ALBUTEROL SULFATE HFA 108 (90 BASE) MCG/ACT IN AERS: 2.0000 | INHALATION_SPRAY | RESPIRATORY_TRACT | 1 refills | Status: DC | PRN

## 2023-11-14 NOTE — Assessment & Plan Note (Signed)

## 2023-11-14 NOTE — Assessment & Plan Note (Signed)
 Continue Metoprolol  XL 25mg  daily. Monitor at home and notify if sustains >130/80. Recommend heart healthy diet such as Mediterranean diet with whole grains, fruits, vegetable, fish, lean meats, nuts, and olive oil. Limit salt. Encouraged moderate walking, 3-5 times/week for 30-50 minutes each session. Aim for at least 150 minutes.week. Goal should be pace of 3 miles/hours, or walking 1.5 miles in 30 minutes. Avoid tobacco products. Avoid excess alcohol. Take medications as prescribed and bring medications and blood pressure log with cuff to each office visit. Seek medical care for chest pain, palpitations, shortness of breath with exertion, dizziness/lightheadedness, vision changes, recurrent headaches, or swelling of extremities. Labs today, follow up 6 months or sooner if needed.

## 2023-11-14 NOTE — Assessment & Plan Note (Signed)
 Diet controlled.

## 2023-11-14 NOTE — Assessment & Plan Note (Signed)
 Diet controlled. TAnti-reflux measures such as raising the head of the bed, avoiding tight clothing or belts, avoiding eating late at night and not lying down shortly after mealtime and achieving weight loss. Avoid ASA, NSAID's, caffeine, peppermints, alcohol and tobacco. Alert me if there are persistent symptoms, dysphagia, weight loss or GI bleeding.

## 2023-11-14 NOTE — Assessment & Plan Note (Signed)
 TSH today, euthyroid on exam

## 2023-11-14 NOTE — Assessment & Plan Note (Signed)
 Now bilateral. No red flags. Known hx of DDD. Would not like to pursue further workup or treatment at this time. Advised on NSAID use and next steps should he wish to pursue. Provided handout on stretches

## 2023-11-14 NOTE — Progress Notes (Addendum)
 Complete physical exam  Patient: Adam [name]   DOB: 10/12/1965   58 y.o. Male  MRN: [id number]  Subjective:    Chief Complaint  Patient presents with   Annual Exam    Physical     Adam [name] is a 58 y.o. male who presents today for a complete physical exam. He reports consuming a general diet. The patient has a physically strenuous job, but has no regular exercise apart from work.  He generally feels well. He reports sleeping fairly well. He does not have additional problems to discuss today.  PMH include IBS with constipation, asthma, right sided sciatica, anxiety, GERD, and HTN Concerns include palpitations with lightheadedness once a month. He cannot identify triggers. Last time it happened was maybe a month ago. He has been worked up for this in the past. Denies chest pain. Also c/o BLE burning pain when he lies down at night. Has previously been told he has DDD, would not like to pursue treatment. Currently using nothing. Would not like to pursue imaging, PT, medication, or referral at this time. Denies saddle numbness, incontinence of urine or stool.  Asthma: 1-2 times weekly when working in dusty environment, advised on rule of 2s, non-smoker  Anxiety: Xanax  nightly 0.5mg  for sleep, advised on risks, declines further treatment  GERD: diet controlled  HTN: on Metoprolol  25mg  daily, denies chest pain, recurrent headaches, vision changes,dyspnea on exertion, or swelling of extremities.   IBS-C: diet controlled, denies melena or hematochezia, colonoscopy UTD  HLD: not taking Atorvastatin      11/14/2023   10:14 AM 04/09/2023    9:07 AM 07/17/2022   10:30 AM  GAD 7 : Generalized Anxiety Score  Nervous, Anxious, on Edge 2 1 1   Control/stop worrying 0 1 0  Worry too much - different things 0 1 1  Trouble relaxing 1 2 1   Restless 1 2 2   Easily annoyed or irritable 1 2 1   Afraid - awful might happen 0 0 0  Total GAD 7 Score 5 9 6   Anxiety Difficulty Not  difficult at all Somewhat difficult Somewhat difficult      Most recent fall risk assessment:    11/14/2023   10:10 AM  Fall Risk   Falls in the past year? 0  Number falls in past yr: 0  Injury with Fall? 0  Risk for fall due to : No Fall Risks  Follow up Falls evaluation completed     Most recent depression screenings:    11/14/2023   10:12 AM 04/09/2023    9:06 AM  PHQ 2/9 Scores  PHQ - 2 Score 0 3  PHQ- 9 Score 8 12    Vision:Not within last year , Dental: No current dental problems and Receives regular dental care, and PSA: Agrees to PSA testing  Patient Active Problem List   Diagnosis Date Noted   History of thyroid nodule 11/14/2023   History of colonic polyps 05/10/2023   Family history of colon cancer 05/10/2023   Dysphagia 05/10/2023   Abdominal pain, epigastric 05/10/2023   Gastroesophageal reflux disease without esophagitis 04/09/2023   Polydipsia 04/09/2023   Prediabetes 04/09/2023   Physical exam, annual 07/17/2022   Sciatica of right side 06/15/2021   Hypercholesterolemia 02/28/2021   Colon polyps 05/26/2019   Lipoma of abdominal wall 03/17/2014   IBS (irritable bowel syndrome)- Constipation Predominant 06/19/2011   Orthopnea 07/31/2010   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   Allergic rhinitis, seasonal 08/25/2009   Essential hypertension, benign  08/04/2007   LOW BACK PAIN, CHRONIC 08/04/2007   Palpitations 04/25/2007   Panic Disorder 07/04/2006   Past Medical History:  Diagnosis Date   Anxiety state, unspecified    Asthma    BACK PAIN, CHRONIC 06/09/2010   Qualifier: Diagnosis of  By: Adam [name], Adam [name]     Bilateral inguinal hernia 02/26/2011   Dental abscess 02/15/2011   Esophageal reflux    no meds. per pt.   Family history of stress    Generalized headaches    Hiatal hernia    pt. denies nerve/mucscle disease   Hypertension    IBS (irritable bowel syndrome)    Major depressive disorder, single episode, unspecified    Onychomycosis  01/18/2014   Palpitations    Panic disorder without agoraphobia    Prostatitis, unspecified    RBBB (right bundle branch block)    Unspecified cardiovascular disease    Visual disturbance    Past Surgical History:  Procedure Laterality Date   COLONOSCOPY  07/26/2005   normal   COLONOSCOPY  05/26/2019   6 small adenomas noted on this colonoscopy, repeat in 3 years for surveillance   ESOPHAGOGASTRODUODENOSCOPY  01/03/2005   normal   FRACTURE SURGERY  2000   left arm   HERNIA REPAIR  03/14/2011   BIH   INGUINAL HERNIA REPAIR  03/14/2011   Procedure: LAPAROSCOPIC BILATERAL INGUINAL HERNIA REPAIR;  Surgeon: Adam Adam Poli, [name];  Location: [facility];  Service: General;  Laterality: N/A;  with mesh   pyloric stenosis      testicle  58 years old   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   Social History   Tobacco Use   Smoking status: Never    Passive exposure: Past   Smokeless tobacco: Never  Vaping Use   Vaping status: Never Used  Substance Use Topics   Alcohol use: No   Drug use: No   Family History  Problem Relation Age of Onset   Heart attack Mother 43       PTCA  and 3 stents   Heart disease Mother    COPD Mother    Anxiety disorder Mother    High blood pressure Mother    Kidney disease Mother    Drug abuse Brother        27 passed away from xanax    Colon cancer Brother        passed away at 35   Hodgkin's lymphoma Maternal Grandmother        65   Heart disease Maternal Grandfather    Pyloric stenosis Daughter 37       X2   Bipolar disorder Daughter    Cancer Maternal Aunt        pt unaware of what kind   Cancer Maternal Uncle        pt unaware of what kind   Anxiety disorder Other    Esophageal cancer Neg Hx    Rectal cancer Neg Hx    Stomach cancer Neg Hx    Allergies  Allergen Reactions   Penicillins Other (See Comments)    Pt has family history of allergy but does not remember ever taking this      Patient Care Team: Adam Jeoffrey RAMAN, Adam [name] as PCP - General (Family Medicine)   Outpatient Medications Prior to Visit  Medication Sig   ALPRAZolam  (XANAX ) 1 MG tablet Take 0.5 mg by mouth at bedtime.   metoprolol  succinate (TOPROL -XL) 25 MG 24 hr tablet TAKE 1  TABLET (25 MG TOTAL) BY MOUTH DAILY.   [DISCONTINUED] albuterol  (VENTOLIN  HFA) 108 (90 Base) MCG/ACT inhaler Inhale 2 puffs into the lungs every 4 (four) hours as needed for wheezing or shortness of breath.   atorvastatin  (LIPITOR) 40 MG tablet TAKE 1 TABLET BY MOUTH EVERY DAY (Patient not taking: Reported on 11/14/2023)   [DISCONTINUED] azithromycin  (ZITHROMAX ) 250 MG tablet Take 2 tabs PO x 1 dose, then 1 tab PO QD x 4 days (Patient not taking: Reported on 07/31/2023)   [DISCONTINUED] budesonide -formoterol  (SYMBICORT ) 80-4.5 MCG/ACT inhaler Inhale 2 puffs into the lungs in the morning and at bedtime. (Patient not taking: Reported on 11/14/2023)   [DISCONTINUED] dicyclomine  (BENTYL ) 10 MG capsule Take 2 capsules (20 mg total) by mouth 4 (four) times daily -  before meals and at bedtime. Take 20-30 minutes before meals (Patient not taking: Reported on 05/26/2019)   [DISCONTINUED] meloxicam  (MOBIC ) 15 MG tablet Take 1 tablet (15 mg total) by mouth daily. (Patient not taking: Reported on 04/09/2023)   [DISCONTINUED] omeprazole  (PRILOSEC) 20 MG capsule TAKE 1 CAPSULE BY MOUTH EVERY DAY (Patient not taking: Reported on 11/14/2023)   [DISCONTINUED] promethazine -dextromethorphan (PROMETHAZINE -DM) 6.25-15 MG/5ML syrup Take 5 mLs by mouth 3 (three) times daily as needed for cough. (Patient not taking: Reported on 07/31/2023)   [DISCONTINUED] tiZANidine  (ZANAFLEX ) 4 MG tablet Take 1 tablet (4 mg total) by mouth every 8 (eight) hours as needed for muscle spasms. (Patient not taking: Reported on 07/31/2023)   No facility-administered medications prior to visit.    Review of Systems  Constitutional: Negative.   HENT: Negative.    Eyes: Negative.   Respiratory:  Positive for shortness of  breath.   Cardiovascular:  Positive for palpitations.  Gastrointestinal: Negative.   Genitourinary: Negative.   Musculoskeletal: Negative.   Skin: Negative.   Neurological: Negative.   Endo/Heme/Allergies: Negative.   Psychiatric/Behavioral: Negative.    All other systems reviewed and are negative.         Objective:     BP 138/88   Pulse 63   Temp 97.8 F (36.6 C)   Ht 5' 6 (1.676 m)   Wt 152 lb 9.6 oz (69.2 kg)   SpO2 96%   BMI 24.63 kg/m  BP Readings from Last 3 Encounters:  11/14/23 138/88  07/31/23 (!) 141/76  05/10/23 (!) 152/84   Wt Readings from Last 3 Encounters:  11/14/23 152 lb 9.6 oz (69.2 kg)  07/31/23 162 lb (73.5 kg)  05/10/23 162 lb (73.5 kg)      Physical Exam Vitals and nursing note reviewed.  Constitutional:      Appearance: Normal appearance. He is normal weight.  HENT:     Head: Normocephalic and atraumatic.     Right Ear: Tympanic membrane, ear canal and external ear normal.     Left Ear: Tympanic membrane, ear canal and external ear normal.     Nose: Nose normal.     Mouth/Throat:     Mouth: Mucous membranes are moist.     Pharynx: Oropharynx is clear.  Eyes:     Extraocular Movements: Extraocular movements intact.     Right eye: Normal extraocular motion and no nystagmus.     Left eye: Normal extraocular motion and no nystagmus.     Conjunctiva/sclera: Conjunctivae normal.     Pupils: Pupils are equal, round, and reactive to light.  Cardiovascular:     Rate and Rhythm: Normal rate and regular rhythm.     Pulses: Normal pulses.  Heart sounds: Normal heart sounds.  Pulmonary:     Effort: Pulmonary effort is normal.     Breath sounds: Normal breath sounds.  Abdominal:     General: Bowel sounds are normal.     Palpations: Abdomen is soft.  Genitourinary:    Comments: Deferred using shared decision making Musculoskeletal:        General: Normal range of motion.     Cervical back: Normal range of motion and neck supple.   Skin:    General: Skin is warm and dry.     Capillary Refill: Capillary refill takes less than 2 seconds.  Neurological:     General: No focal deficit present.     Mental Status: He is alert and oriented to person, place, and time. Mental status is at baseline.  Psychiatric:        Mood and Affect: Mood normal.        Speech: Speech normal.        Behavior: Behavior normal.        Thought Content: Thought content normal.        Cognition and Memory: Cognition and memory normal.        Judgment: Judgment normal.      Results for orders placed or performed in visit on 11/14/23  CBC with Differential/Platelet  Result Value Ref Range   WBC 6.1 3.8 - 10.8 Thousand/uL   RBC 5.16 4.20 - 5.80 Million/uL   Hemoglobin 14.6 13.2 - 17.1 g/dL   HCT 54.3 61.4 - 49.9 %   MCV 88.4 80.0 - 100.0 fL   MCH 28.3 27.0 - 33.0 pg   MCHC 32.0 32.0 - 36.0 g/dL   RDW 88.1 88.9 - 84.9 %   Platelets 140 140 - 400 Thousand/uL   MPV 11.6 7.5 - 12.5 fL   Neutro Abs 3,843 1,500 - 7,800 cells/uL   Absolute Lymphocytes 1,391 850 - 3,900 cells/uL   Absolute Monocytes 628 200 - 950 cells/uL   Eosinophils Absolute 177 15 - 500 cells/uL   Basophils Absolute 61 0 - 200 cells/uL   Neutrophils Relative % 63 %   Total Lymphocyte 22.8 %   Monocytes Relative 10.3 %   Eosinophils Relative 2.9 %   Basophils Relative 1.0 %  Comprehensive metabolic panel with GFR  Result Value Ref Range   Glucose, Bld 80 65 - 99 mg/dL   BUN 18 7 - 25 mg/dL   Creat 8.83 9.29 - 8.69 mg/dL   eGFR 73 > OR = 60 fO/fpw/8.26f7   BUN/Creatinine Ratio SEE NOTE: 6 - 22 (calc)   Sodium 141 135 - 146 mmol/L   Potassium 4.4 3.5 - 5.3 mmol/L   Chloride 106 98 - 110 mmol/L   CO2 25 20 - 32 mmol/L   Calcium  9.0 8.6 - 10.3 mg/dL   Total Protein 6.3 6.1 - 8.1 g/dL   Albumin 4.3 3.6 - 5.1 g/dL   Globulin 2.0 1.9 - 3.7 g/dL (calc)   AG Ratio 2.2 1.0 - 2.5 (calc)   Total Bilirubin 0.3 0.2 - 1.2 mg/dL   Alkaline phosphatase (APISO) 85 35 - 144  U/L   AST 24 10 - 35 U/L   ALT 31 9 - 46 U/L  Lipid panel  Result Value Ref Range   Cholesterol 141 <200 mg/dL   HDL 49 > OR = 40 mg/dL   Triglycerides 79 <849 mg/dL   LDL Cholesterol (Calc) 76 mg/dL (calc)   Total CHOL/HDL Ratio 2.9 <5.0 (calc)  Non-HDL Cholesterol (Calc) 92 <869 mg/dL (calc)  TSH  Result Value Ref Range   TSH 1.25 0.40 - 4.50 mIU/L  Hemoglobin A1c  Result Value Ref Range   Hgb A1c MFr Bld 6.3 (H) <5.7 %   Mean Plasma Glucose 134 mg/dL   eAG (mmol/L) 7.4 mmol/L  PSA  Result Value Ref Range   PSA 0.59 < OR = 4.00 ng/mL   Last CBC Lab Results  Component Value Date   WBC 6.1 11/14/2023   HGB 14.6 11/14/2023   HCT 45.6 11/14/2023   MCV 88.4 11/14/2023   MCH 28.3 11/14/2023   RDW 11.8 11/14/2023   PLT 140 11/14/2023   Last metabolic panel Lab Results  Component Value Date   GLUCOSE 80 11/14/2023   NA 141 11/14/2023   K 4.4 11/14/2023   CL 106 11/14/2023   CO2 25 11/14/2023   BUN 18 11/14/2023   CREATININE 1.16 11/14/2023   EGFR 73 11/14/2023   CALCIUM  9.0 11/14/2023   PROT 6.3 11/14/2023   ALBUMIN 4.1 08/09/2021   LABGLOB 2.2 02/24/2021   AGRATIO 2.1 02/24/2021   BILITOT 0.3 11/14/2023   ALKPHOS 86 08/09/2021   AST 24 11/14/2023   ALT 31 11/14/2023   ANIONGAP 6 08/09/2021   Last lipids Lab Results  Component Value Date   CHOL 141 11/14/2023   HDL 49 11/14/2023   LDLCALC 76 11/14/2023   LDLDIRECT 98 05/09/2021   TRIG 79 11/14/2023   CHOLHDL 2.9 11/14/2023   Last hemoglobin A1c Lab Results  Component Value Date   HGBA1C 6.3 (H) 11/14/2023   Last thyroid functions Lab Results  Component Value Date   TSH 1.25 11/14/2023   Last vitamin D No results found for: 25OHVITD2, 25OHVITD3, VD25OH Last vitamin B12 and Folate No results found for: VITAMINB12, FOLATE      Assessment & Plan:    Routine Health Maintenance and Physical Exam  Immunization History  Administered Date(s) Administered   PPD Test 07/31/2010,  07/13/2011   Td 05/08/1999   Tdap 07/03/2010    Health Maintenance  Topic Date Due   Hepatitis B Vaccines (1 of 3 - 19+ 3-dose series) Never done   COVID-19 Vaccine (1 - 2024-25 season) 11/30/2023 (Originally 01/06/2023)   Zoster Vaccines- Shingrix (1 of 2) 02/14/2024 (Originally 09/02/2014)   Pneumococcal Vaccine 76-27 Years old (1 of 2 - PCV) 11/13/2024 (Originally 09/02/1983)   INFLUENZA VACCINE  12/06/2023   Colonoscopy  07/30/2028   Hepatitis C Screening  Completed   HIV Screening  Completed   HPV VACCINES  Aged Out   Meningococcal B Vaccine  Aged Out   DTaP/Tdap/Td  Discontinued    Discussed health benefits of physical activity, and encouraged him to engage in regular exercise appropriate for his age and condition.  Problem List Items Addressed This Visit     Essential hypertension, benign   Continue Metoprolol  XL 25mg  daily. Monitor at home and notify if sustains >130/80. Recommend heart healthy diet such as Mediterranean diet with whole grains, fruits, vegetable, fish, lean meats, nuts, and olive oil. Limit salt. Encouraged moderate walking, 3-5 times/week for 30-50 minutes each session. Aim for at least 150 minutes.week. Goal should be pace of 3 miles/hours, or walking 1.5 miles in 30 minutes. Avoid tobacco products. Avoid excess alcohol. Take medications as prescribed and bring medications and blood pressure log with cuff to each office visit. Seek medical care for chest pain, palpitations, shortness of breath with exertion, dizziness/lightheadedness, vision changes, recurrent headaches, or swelling  of extremities. Labs today, follow up 6 months or sooner if needed.       Relevant Orders   EKG 12-Lead   CBC with Differential/Platelet (Completed)   Comprehensive metabolic panel with GFR (Completed)   Lipid panel (Completed)   TSH (Completed)   Hemoglobin A1c (Completed)   PSA (Completed)   LONG TERM MONITOR (3-14 DAYS)   Palpitations   Sinus brady on EKG, CMP and TSH  today, continue Metoprolol , Zio ordered, advised when to seek emergent medical care.      IBS (irritable bowel syndrome)- Constipation Predominant   Diet controlled.      Hypercholesterolemia   Not taking statin. Lipids today. Your labs showed elevated cholesterol. I recommend consuming a heart healthy diet such as Mediterranean diet or DASH diet with whole grains, fruits, vegetable, fish, lean meats, nuts, and olive oil. Limit sweets and processed foods. I also encourage moderate intensity exercise 150 minutes weekly. This is 3-5 times weekly for 30-50 minutes each session. Goal should be pace of 3 miles/hours, or walking 1.5 miles in 30 minutes.      Relevant Orders   Lipid panel (Completed)   Sciatica of right side   Now bilateral. No red flags. Known hx of DDD. Would not like to pursue further workup or treatment at this time. Advised on NSAID use and next steps should he wish to pursue. Provided handout on stretches      Physical exam, annual - Primary   Today your medical history was reviewed and routine physical exam with labs was performed. Recommend 150 minutes of moderate intensity exercise weekly and consuming a well-balanced diet. Advised to stop smoking if a smoker, avoid smoking if a non-smoker, limit alcohol consumption to 1 drink per day for women and 2 drinks per day for men, and avoid illicit drug use. Counseled on safe sex practices and offered STI testing today. Counseled on the importance of sunscreen use. Counseled in mental health awareness and when to seek medical care. Vaccine maintenance discussed. Appropriate health maintenance items reviewed. Return to office in 1 year for annual physical exam.       Relevant Orders   EKG 12-Lead   CBC with Differential/Platelet (Completed)   Comprehensive metabolic panel with GFR (Completed)   Lipid panel (Completed)   TSH (Completed)   Hemoglobin A1c (Completed)   PSA (Completed)   LONG TERM MONITOR (3-14 DAYS)    Gastroesophageal reflux disease without esophagitis   Diet controlled. TAnti-reflux measures such as raising the head of the bed, avoiding tight clothing or belts, avoiding eating late at night and not lying down shortly after mealtime and achieving weight loss. Avoid ASA, NSAID's, caffeine, peppermints, alcohol and tobacco. Alert me if there are persistent symptoms, dysphagia, weight loss or GI bleeding.      Prediabetes   A1c today      Relevant Orders   Hemoglobin A1c (Completed)   History of thyroid nodule   TSH today, euthyroid on exam      Relevant Orders   TSH (Completed)   Other Visit Diagnoses       Asthma, unspecified asthma severity, unspecified whether complicated, unspecified whether persistent       Relevant Medications   albuterol  (VENTOLIN  HFA) 108 (90 Base) MCG/ACT inhaler     Prostate cancer screening       Relevant Orders   PSA (Completed)     Shortness of breath       Relevant Medications   albuterol  (VENTOLIN  HFA)  108 (90 Base) MCG/ACT inhaler      Return in about 6 months (around 05/16/2024) for chronic follow-up with labs 1 week prior.     Jeoffrey GORMAN Barrio, Adam [name]

## 2023-11-14 NOTE — Assessment & Plan Note (Signed)
-

## 2023-11-14 NOTE — Assessment & Plan Note (Signed)
 Not taking statin. Lipids today. Your labs showed elevated cholesterol. I recommend consuming a heart healthy diet such as Mediterranean diet or DASH diet with whole grains, fruits, vegetable, fish, lean meats, nuts, and olive oil. Limit sweets and processed foods. I also encourage moderate intensity exercise 150 minutes weekly. This is 3-5 times weekly for 30-50 minutes each session. Goal should be pace of 3 miles/hours, or walking 1.5 miles in 30 minutes.

## 2023-11-14 NOTE — Progress Notes (Unsigned)
 EP to read.

## 2023-11-14 NOTE — Assessment & Plan Note (Signed)
 Sinus brady on EKG, CMP and TSH today, continue Metoprolol , Zio ordered, advised when to seek emergent medical care.

## 2023-11-15 LAB — COMPREHENSIVE METABOLIC PANEL WITH GFR
AG Ratio: 2.2 (calc) (ref 1.0–2.5)
ALT: 31 U/L (ref 9–46)
AST: 24 U/L (ref 10–35)
Albumin: 4.3 g/dL (ref 3.6–5.1)
Alkaline phosphatase (APISO): 85 U/L (ref 35–144)
BUN: 18 mg/dL (ref 7–25)
CO2: 25 mmol/L (ref 20–32)
Calcium: 9 mg/dL (ref 8.6–10.3)
Chloride: 106 mmol/L (ref 98–110)
Creat: 1.16 mg/dL (ref 0.70–1.30)
Globulin: 2 g/dL (ref 1.9–3.7)
Glucose, Bld: 80 mg/dL (ref 65–99)
Potassium: 4.4 mmol/L (ref 3.5–5.3)
Sodium: 141 mmol/L (ref 135–146)
Total Bilirubin: 0.3 mg/dL (ref 0.2–1.2)
Total Protein: 6.3 g/dL (ref 6.1–8.1)
eGFR: 73 mL/min/1.73m2 (ref >=60)

## 2023-11-15 LAB — HEMOGLOBIN A1C
Hgb A1c MFr Bld: 6.3 % — ABNORMAL HIGH (ref <5.7)
Mean Plasma Glucose: 134 mg/dL
eAG (mmol/L): 7.4 mmol/L

## 2023-11-15 LAB — CBC WITH DIFFERENTIAL/PLATELET
Absolute Lymphocytes: 1391 {cells}/uL (ref 850–3900)
Absolute Monocytes: 628 {cells}/uL (ref 200–950)
Basophils Absolute: 61 {cells}/uL (ref 0–200)
Basophils Relative: 1 %
Eosinophils Absolute: 177 {cells}/uL (ref 15–500)
Eosinophils Relative: 2.9 %
HCT: 45.6 % (ref 38.5–50.0)
Hemoglobin: 14.6 g/dL (ref 13.2–17.1)
MCH: 28.3 pg (ref 27.0–33.0)
MCHC: 32 g/dL (ref 32.0–36.0)
MCV: 88.4 fL (ref 80.0–100.0)
MPV: 11.6 fL (ref 7.5–12.5)
Monocytes Relative: 10.3 %
Neutro Abs: 3843 {cells}/uL (ref 1500–7800)
Neutrophils Relative %: 63 %
Platelets: 140 Thousand/uL (ref 140–400)
RBC: 5.16 Million/uL (ref 4.20–5.80)
RDW: 11.8 % (ref 11.0–15.0)
Total Lymphocyte: 22.8 %
WBC: 6.1 Thousand/uL (ref 3.8–10.8)

## 2023-11-15 LAB — LIPID PANEL
Cholesterol: 141 mg/dL (ref <200)
HDL: 49 mg/dL (ref >=40)
LDL Cholesterol (Calc): 76 mg/dL
Non-HDL Cholesterol (Calc): 92 mg/dL (ref <130)
Total CHOL/HDL Ratio: 2.9 (calc) (ref <5.0)
Triglycerides: 79 mg/dL (ref <150)

## 2023-11-15 LAB — TSH: TSH: 1.25 m[IU]/L (ref 0.40–4.50)

## 2023-11-15 LAB — PSA: PSA: 0.59 ng/mL (ref <=4.00)

## 2023-11-18 ENCOUNTER — Ambulatory Visit: Payer: Self-pay | Admitting: Family Medicine

## 2023-12-16 ENCOUNTER — Other Ambulatory Visit: Payer: Self-pay | Admitting: Family Medicine

## 2023-12-16 DIAGNOSIS — I10 Essential (primary) hypertension: Secondary | ICD-10-CM

## 2023-12-16 DIAGNOSIS — Z8639 Personal history of other endocrine, nutritional and metabolic disease: Secondary | ICD-10-CM

## 2023-12-16 DIAGNOSIS — K219 Gastro-esophageal reflux disease without esophagitis: Secondary | ICD-10-CM

## 2023-12-16 DIAGNOSIS — Z125 Encounter for screening for malignant neoplasm of prostate: Secondary | ICD-10-CM

## 2023-12-16 DIAGNOSIS — R0602 Shortness of breath: Secondary | ICD-10-CM

## 2023-12-16 DIAGNOSIS — K581 Irritable bowel syndrome with constipation: Secondary | ICD-10-CM

## 2023-12-16 DIAGNOSIS — J45909 Unspecified asthma, uncomplicated: Secondary | ICD-10-CM

## 2023-12-16 DIAGNOSIS — Z0000 Encounter for general adult medical examination without abnormal findings: Secondary | ICD-10-CM

## 2023-12-16 DIAGNOSIS — R7303 Prediabetes: Secondary | ICD-10-CM

## 2023-12-16 DIAGNOSIS — E7800 Pure hypercholesterolemia, unspecified: Secondary | ICD-10-CM

## 2023-12-16 DIAGNOSIS — M5431 Sciatica, right side: Secondary | ICD-10-CM

## 2023-12-16 DIAGNOSIS — R002 Palpitations: Secondary | ICD-10-CM

## 2023-12-19 DIAGNOSIS — R0602 Shortness of breath: Secondary | ICD-10-CM

## 2023-12-19 DIAGNOSIS — R002 Palpitations: Secondary | ICD-10-CM

## 2023-12-23 ENCOUNTER — Ambulatory Visit: Payer: Self-pay | Admitting: Family Medicine

## 2023-12-23 DIAGNOSIS — I4729 Other ventricular tachycardia: Secondary | ICD-10-CM

## 2024-01-01 ENCOUNTER — Other Ambulatory Visit (HOSPITAL_COMMUNITY)

## 2024-01-01 ENCOUNTER — Ambulatory Visit (HOSPITAL_COMMUNITY)
Admission: RE | Admit: 2024-01-01 | Discharge: 2024-01-01 | Disposition: A | Discharge status: Home / Self Care | Source: Ambulatory Visit | Attending: Family Medicine | Admitting: Family Medicine

## 2024-01-01 ENCOUNTER — Ambulatory Visit (HOSPITAL_COMMUNITY)
Admission: RE | Admit: 2024-01-01 | Discharge: 2024-01-01 | Disposition: A | Discharge status: Home / Self Care | Source: Ambulatory Visit | Attending: Family Medicine

## 2024-01-01 DIAGNOSIS — E785 Hyperlipidemia, unspecified: Secondary | ICD-10-CM | POA: Insufficient documentation

## 2024-01-01 DIAGNOSIS — I4729 Other ventricular tachycardia: Secondary | ICD-10-CM | POA: Insufficient documentation

## 2024-01-01 DIAGNOSIS — I10 Essential (primary) hypertension: Secondary | ICD-10-CM | POA: Insufficient documentation

## 2024-01-01 LAB — ECHOCARDIOGRAM COMPLETE
AR max vel: 2.73 cm2
AV Area VTI: 2.81 cm2
AV Area mean vel: 3.03 cm2
AV Mean grad: 2 mmHg
AV Peak grad: 5 mmHg
Ao pk vel: 1.12 m/s
Area-P 1/2: 5.34 cm2
S' Lateral: 2.6 cm

## 2024-01-08 ENCOUNTER — Encounter (HOSPITAL_COMMUNITY): Payer: Self-pay

## 2024-01-08 ENCOUNTER — Ambulatory Visit (HOSPITAL_COMMUNITY)
Admission: EM | Admit: 2024-01-08 | Discharge: 2024-01-08 | Disposition: A | Discharge status: Home / Self Care | Attending: Internal Medicine | Admitting: Internal Medicine

## 2024-01-08 DIAGNOSIS — R0602 Shortness of breath: Secondary | ICD-10-CM | POA: Diagnosis not present

## 2024-01-08 DIAGNOSIS — J4541 Moderate persistent asthma with (acute) exacerbation: Secondary | ICD-10-CM | POA: Diagnosis not present

## 2024-01-08 DIAGNOSIS — R051 Acute cough: Secondary | ICD-10-CM

## 2024-01-08 MED ORDER — IPRATROPIUM-ALBUTEROL 0.5-2.5 (3) MG/3ML IN SOLN
3.0000 mL | Freq: Once | RESPIRATORY_TRACT | Status: AC
  Administered 2024-01-08: 3 mL via RESPIRATORY_TRACT

## 2024-01-08 MED ORDER — METHYLPREDNISOLONE SODIUM SUCC 125 MG IJ SOLR
80.0000 mg | Freq: Once | INTRAMUSCULAR | Status: AC
  Administered 2024-01-08: 80 mg via INTRAMUSCULAR

## 2024-01-08 MED ORDER — PREDNISONE 20 MG PO TABS
40.0000 mg | ORAL_TABLET | Freq: Every day | ORAL | 0 refills | Status: AC
Start: 2024-01-08 — End: 2024-01-13

## 2024-01-08 MED ORDER — PROMETHAZINE-DM 6.25-15 MG/5ML PO SYRP: 5.0000 mL | ORAL_SOLUTION | Freq: Every evening | ORAL | 0 refills | Status: AC | PRN

## 2024-01-08 MED ORDER — ALBUTEROL SULFATE (2.5 MG/3ML) 0.083% IN NEBU: 2.5000 mg | INHALATION_SOLUTION | Freq: Four times a day (QID) | RESPIRATORY_TRACT | 12 refills | Status: AC | PRN

## 2024-01-08 MED ORDER — IPRATROPIUM-ALBUTEROL 0.5-2.5 (3) MG/3ML IN SOLN
RESPIRATORY_TRACT | Status: AC
  Filled 2024-01-08: qty 3

## 2024-01-08 MED ORDER — METHYLPREDNISOLONE SODIUM SUCC 125 MG IJ SOLR
INTRAMUSCULAR | Status: AC
  Filled 2024-01-08: qty 2

## 2024-01-08 NOTE — ED Triage Notes (Signed)
 Patient here today with c/o SOB, fatigue, HA, some productive cough, nasal congestion, and body aches X 2 weeks. Has not been taking any medication for it. Patient has a h/o asthma and has been using Albuterol  with some relief.

## 2024-01-08 NOTE — ED Provider Notes (Signed)
 [facility]    CSN: [id number] Arrival date & time: 01/08/24  9195      History   Chief Complaint Chief Complaint  Patient presents with   Fatigue    HPI Adam [name] is a 58 y.o. male.   Adam [name] is a 58 y.o. male presenting for chief complaint of cough, congestion, generalized fatigue, nasal congestion, and bodyaches that started 2 weeks ago.  His wife is sick with similar symptoms.  Cough is productive with clear sputum.  Cough is associated with intermittent shortness of breath and wheezing.  History of asthma, states he is using his albuterol  inhaler multiple times per day with some relief of shortness of breath.  Denies chest pain, leg swelling, orthopnea, dizziness, fever, chills, nausea, vomiting, diarrhea, abdominal pain, and rash.  Exposed to secondhand smoke via his wife, patient has never been a smoker himself.  Denies recent antibiotic or steroid use.  Taking over-the-counter medications with minimal relief of symptoms.  He cannot remember the last time he had an asthma attack.     Past Medical History:  Diagnosis Date   Anxiety state, unspecified    Asthma    BACK PAIN, CHRONIC 06/09/2010   Qualifier: Diagnosis of  By: Eldonna MD, Steven     Bilateral inguinal hernia 02/26/2011   Dental abscess 02/15/2011   Esophageal reflux    no meds. per pt.   Family history of stress    Generalized headaches    Hiatal hernia    pt. denies nerve/mucscle disease   Hypertension    IBS (irritable bowel syndrome)    Major depressive disorder, single episode, unspecified    Onychomycosis 01/18/2014   Palpitations    Panic disorder without agoraphobia    Prostatitis, unspecified    RBBB (right bundle branch block)    Unspecified cardiovascular disease    Visual disturbance     Patient Active Problem List   Diagnosis Date Noted   History of thyroid nodule 11/14/2023   History of colonic polyps 05/10/2023   Family history of colon cancer  05/10/2023   Dysphagia 05/10/2023   Abdominal pain, epigastric 05/10/2023   Gastroesophageal reflux disease without esophagitis 04/09/2023   Polydipsia 04/09/2023   Prediabetes 04/09/2023   Physical exam, annual 07/17/2022   Sciatica of right side 06/15/2021   Hypercholesterolemia 02/28/2021   Colon polyps 05/26/2019   Lipoma of abdominal wall 03/17/2014   IBS (irritable bowel syndrome)- Constipation Predominant 06/19/2011   Orthopnea 07/31/2010   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   Allergic rhinitis, seasonal 08/25/2009   Essential hypertension, benign 08/04/2007   LOW BACK PAIN, CHRONIC 08/04/2007   Palpitations 04/25/2007   Panic Disorder 07/04/2006    Past Surgical History:  Procedure Laterality Date   COLONOSCOPY  07/26/2005   normal   COLONOSCOPY  05/26/2019   6 small adenomas noted on this colonoscopy, repeat in 3 years for surveillance   ESOPHAGOGASTRODUODENOSCOPY  01/03/2005   normal   FRACTURE SURGERY  2000   left arm   HERNIA REPAIR  03/14/2011   BIH   INGUINAL HERNIA REPAIR  03/14/2011   Procedure: LAPAROSCOPIC BILATERAL INGUINAL HERNIA REPAIR;  Surgeon: Vicenta DELENA Poli, MD;  Location: [facility];  Service: General;  Laterality: N/A;  with mesh   pyloric stenosis      testicle  58 years old   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 Authorizing Provider  albuterol  (PROVENTIL ) (2.5 MG/3ML) 0.083% nebulizer solution Take 3 mLs (2.5 mg total) by nebulization every 6 (six) hours as needed for wheezing or shortness of breath. 01/08/24  Yes Enedelia Dorna HERO, FNP  predniSONE  (DELTASONE ) 20 MG tablet Take 2 tablets (40 mg total) by mouth daily with breakfast for 5 days. 01/08/24 01/13/24 Yes StanhopeDorna HERO, FNP  promethazine -dextromethorphan (PROMETHAZINE -DM) 6.25-15 MG/5ML syrup Take 5 mLs by mouth at bedtime as needed for cough. 01/08/24  Yes Enedelia Dorna HERO, FNP  ALPRAZolam  (XANAX ) 1 MG tablet Take 0.5 mg by mouth at bedtime.    [provider]  metoprolol  succinate (TOPROL -XL) 25 MG 24 hr tablet TAKE 1 TABLET (25 MG TOTAL) BY MOUTH DAILY. 10/09/23   Kayla Jeoffrey RAMAN, FNP    Family History Family History  Problem Relation Age of Onset   Heart attack Mother 45       PTCA  and 3 stents   Heart disease Mother    COPD Mother    Anxiety disorder Mother    High blood pressure Mother    Kidney disease Mother    Drug abuse Brother        5 passed away from xanax    Colon cancer Brother        passed away at 61   Hodgkin's lymphoma Maternal Grandmother        87   Heart disease Maternal Grandfather    Pyloric stenosis Daughter 27       X2   Bipolar disorder Daughter    Cancer Maternal Aunt        pt unaware of what kind   Cancer Maternal Uncle        pt unaware of what kind   Anxiety disorder Other    Esophageal cancer Neg Hx    Rectal cancer Neg Hx    Stomach cancer Neg Hx     Social History Social History   Tobacco Use   Smoking status: Never    Passive exposure: Past   Smokeless tobacco: Never  Vaping Use   Vaping status: Never Used  Substance Use Topics   Alcohol use: No   Drug use: No     Allergies   Penicillins   Review of Systems Review of Systems Per HPI  Physical Exam Triage Vital Signs ED Triage Vitals  Encounter Vitals Group     BP 01/08/24 0827 (!) 169/114     Girls Systolic BP Percentile --      Girls Diastolic BP Percentile --      Boys Systolic BP Percentile --      Boys Diastolic BP Percentile --      Pulse Rate 01/08/24 0827 67     Resp 01/08/24 0827 16     Temp 01/08/24 0827 98 F (36.7 C)     Temp Source 01/08/24 0827 Oral     SpO2 01/08/24 0827 95 %     Weight --      Height --      Head Circumference --      Peak Flow --      Pain Score 01/08/24 0829 0     Pain Loc --      Pain Education --      Exclude from Growth Chart --    No data found.  Updated Vital  Signs BP (!) 169/114 (BP Location: Right Arm)   Pulse 67   Temp 98  F (36.7 C) (Oral)   Resp 16   SpO2 95%   Visual Acuity Right Eye Distance:   Left Eye Distance:   Bilateral Distance:    Right Eye Near:   Left Eye Near:    Bilateral Near:     Physical Exam Vitals and nursing note reviewed.  Constitutional:      Appearance: He is not ill-appearing or toxic-appearing.  HENT:     Head: Normocephalic and atraumatic.     Right Ear: Hearing, tympanic membrane, ear canal and external ear normal.     Left Ear: Hearing, tympanic membrane, ear canal and external ear normal.     Nose: Congestion present.     Mouth/Throat:     Lips: Pink.     Mouth: Mucous membranes are moist. No injury or oral lesions.     Dentition: Normal dentition.     Tongue: No lesions.     Pharynx: Oropharynx is clear. Uvula midline. No pharyngeal swelling, oropharyngeal exudate, posterior oropharyngeal erythema, uvula swelling or postnasal drip.     Tonsils: No tonsillar exudate.  Eyes:     General: Lids are normal. Vision grossly intact. Gaze aligned appropriately.     Extraocular Movements: Extraocular movements intact.     Conjunctiva/sclera: Conjunctivae normal.  Neck:     Trachea: Trachea and phonation normal.  Cardiovascular:     Rate and Rhythm: Normal rate and regular rhythm.     Heart sounds: Normal heart sounds, S1 normal and S2 normal.  Pulmonary:     Effort: Pulmonary effort is normal. No respiratory distress.     Breath sounds: Normal air entry. Wheezing (Inspiratory and expiratory wheezing heard all lung fields bilaterally.) present. No rhonchi or rales.     Comments: Speaking in full sentences without difficulty.  Harsh and prominent cough on exam. Chest:     Chest wall: No tenderness.  Musculoskeletal:     Cervical back: Neck supple.  Lymphadenopathy:     Cervical: No cervical adenopathy.  Skin:    General: Skin is warm and dry.     Capillary Refill: Capillary refill takes less than  2 seconds.     Findings: No rash.  Neurological:     General: No focal deficit present.     Mental Status: He is alert and oriented to person, place, and time. Mental status is at baseline.     Cranial Nerves: No dysarthria or facial asymmetry.  Psychiatric:        Mood and Affect: Mood normal.        Speech: Speech normal.        Behavior: Behavior normal.        Thought Content: Thought content normal.        Judgment: Judgment normal.      UC Treatments / Results  Labs (all labs ordered are listed, but only abnormal results are displayed) Labs Reviewed - No data to display  EKG   Radiology No results found.  Procedures Procedures (including critical care time)  Medications Ordered in UC Medications  ipratropium-albuterol  (DUONEB) 0.5-2.5 (3) MG/3ML nebulizer solution 3 mL (3 mLs Nebulization Given 01/08/24 0922)  methylPREDNISolone  sodium succinate (SOLU-MEDROL ) 125 mg/2 mL injection 80 mg (80 mg Intramuscular Given 01/08/24 0920)    Initial Impression / Assessment and Plan / UC Course  I have reviewed the triage vital signs and the nursing notes.  Pertinent labs & imaging results that were available during my care of the patient were reviewed by me and  considered in my medical decision making (see chart for details).   1.  Moderate persistent asthma with acute exacerbation, shortness of breath, acute cough Presentation consistent with acute asthma exacerbation.   DuoNeb nebulizer treatment given with significant symptomatic improvement reported by patient and lung sounds on reassessment.   Non-focal lung assessment after nebulizer treatment and low suspicion for acute pneumonia/focal finding, therefore deferred imaging of the chest.   Viral testing: Deferred given timing of illness  Solu-Medrol  80 mg IM steroid given today.  Prednisone  burst to be started tomorrow.   Will treat with prednisone  burst, albuterol  PRN, and antitussive PRN at bedtime.   Discussed PCP  follow-up to discuss asthma action plan to prevent future asthma exacerbations.   Counseled patient on potential for adverse effects with medications prescribed/recommended today, strict ER and return-to-clinic precautions discussed, patient verbalized understanding.    Final Clinical Impressions(s) / UC Diagnoses   Final diagnoses:  Moderate persistent asthma with acute exacerbation  Shortness of breath  Acute cough     Discharge Instructions      Your symptoms are due to asthma attack.  - Use albuterol  nebulizer treatments every 4-6 hours on a schedule for the next 24 hours, then as needed for cough, shortness of breath, and wheeze. -I have refilled your albuterol  inhaler as well as your albuterol  nebulizer solution. - Start taking prednisone  pills tomorrow. Take the steroid sent to the pharmacy as directed to help reduce lung inflammation and decrease the risk of another attack in the next few days. No NSAIDs like ibuprofen  or aleve /naproxen  while taking steroid. Take with food to avoid stomach upset. - Cough medication as needed. Promethazine  DM will cause drowsiness, only use this at bedtime.   If your symptoms do not improve in the next 2-3 days with interventions, please return. Please seek medical care for new or returning symptoms, such as difficulty breathing that doesn't improve with your medications, chest pain, voice changes, high fevers, confusion, or other new or worsening symptoms. Follow-up with PCP for ongoing management of asthma.  Your blood pressure was elevated in the clinic today.  Continue taking your blood pressure medications as prescribed.  Attempt to eat a low-salt diet (less than 1 g/day) and exercise to naturally lower blood pressure. Take your blood pressure 2-3 times per week at home and write these numbers down in a log to bring with you to your next primary care appointment.      ED Prescriptions     Medication Sig Dispense Auth. Provider    predniSONE  (DELTASONE ) 20 MG tablet Take 2 tablets (40 mg total) by mouth daily with breakfast for 5 days. 10 tablet Enedelia Dorna HERO, FNP   promethazine -dextromethorphan (PROMETHAZINE -DM) 6.25-15 MG/5ML syrup Take 5 mLs by mouth at bedtime as needed for cough. 118 mL Enedelia Dorna M, FNP   albuterol  (PROVENTIL ) (2.5 MG/3ML) 0.083% nebulizer solution Take 3 mLs (2.5 mg total) by nebulization every 6 (six) hours as needed for wheezing or shortness of breath. 75 mL Enedelia Dorna HERO, FNP      PDMP not reviewed this encounter.   Enedelia Dorna HERO, [place] 01/08/24 1105

## 2024-01-08 NOTE — ED Notes (Addendum)
 Reviewed precautions using promethazine  cough medicine.  Patient states understanding

## 2024-01-08 NOTE — Discharge Instructions (Addendum)
 Your symptoms are due to asthma attack.  - Use albuterol  nebulizer treatments every 4-6 hours on a schedule for the next 24 hours, then as needed for cough, shortness of breath, and wheeze. -I have refilled your albuterol  inhaler as well as your albuterol  nebulizer solution. - Start taking prednisone  pills tomorrow. Take the steroid sent to the pharmacy as directed to help reduce lung inflammation and decrease the risk of another attack in the next few days. No NSAIDs like ibuprofen  or aleve /naproxen  while taking steroid. Take with food to avoid stomach upset. - Cough medication as needed. Promethazine  DM will cause drowsiness, only use this at bedtime.   If your symptoms do not improve in the next 2-3 days with interventions, please return. Please seek medical care for new or returning symptoms, such as difficulty breathing that doesn't improve with your medications, chest pain, voice changes, high fevers, confusion, or other new or worsening symptoms. Follow-up with PCP for ongoing management of asthma.  Your blood pressure was elevated in the clinic today.  Continue taking your blood pressure medications as prescribed.  Attempt to eat a low-salt diet (less than 1 g/day) and exercise to naturally lower blood pressure. Take your blood pressure 2-3 times per week at home and write these numbers down in a log to bring with you to your next primary care appointment.

## 2024-03-30 ENCOUNTER — Other Ambulatory Visit: Payer: Self-pay | Admitting: Family Medicine

## 2024-03-30 DIAGNOSIS — R002 Palpitations: Secondary | ICD-10-CM
